# Patient Record
Sex: Female | Born: 1935 | Race: Black or African American | Hispanic: No | Smoking: Former smoker
Health system: Southern US, Community
[De-identification: ages and names within clinical notes are randomized; demographics above are authoritative.]

## PROBLEM LIST (undated history)

## (undated) DIAGNOSIS — F419 Anxiety disorder, unspecified: Secondary | ICD-10-CM

## (undated) DIAGNOSIS — C569 Malignant neoplasm of unspecified ovary: Secondary | ICD-10-CM

## (undated) DIAGNOSIS — I10 Essential (primary) hypertension: Secondary | ICD-10-CM

## (undated) DIAGNOSIS — K219 Gastro-esophageal reflux disease without esophagitis: Secondary | ICD-10-CM

## (undated) DIAGNOSIS — M069 Rheumatoid arthritis, unspecified: Secondary | ICD-10-CM

## (undated) DIAGNOSIS — E785 Hyperlipidemia, unspecified: Secondary | ICD-10-CM

## (undated) DIAGNOSIS — Z8543 Personal history of malignant neoplasm of ovary: Secondary | ICD-10-CM

## (undated) DIAGNOSIS — I509 Heart failure, unspecified: Secondary | ICD-10-CM

## (undated) DIAGNOSIS — I429 Cardiomyopathy, unspecified: Secondary | ICD-10-CM

## (undated) DIAGNOSIS — I2699 Other pulmonary embolism without acute cor pulmonale: Secondary | ICD-10-CM

## (undated) HISTORY — DX: Cardiomyopathy, unspecified: I42.9

## (undated) HISTORY — DX: Gastro-esophageal reflux disease without esophagitis: K21.9

## (undated) HISTORY — DX: Anxiety disorder, unspecified: F41.9

## (undated) HISTORY — DX: Heart failure, unspecified: I50.9

## (undated) HISTORY — DX: Essential (primary) hypertension: I10

## (undated) HISTORY — PX: TOTAL HIP ARTHROPLASTY: SHX124

## (undated) HISTORY — DX: Personal history of malignant neoplasm of ovary: Z85.43

---

## 1998-05-23 ENCOUNTER — Encounter: Admission: RE | Admit: 1998-05-23 | Discharge: 1998-05-24 | Payer: Self-pay | Admitting: Internal Medicine

## 1998-09-03 ENCOUNTER — Encounter: Admission: RE | Admit: 1998-09-03 | Discharge: 1998-09-06 | Payer: Self-pay | Admitting: Orthopedic Surgery

## 1998-09-26 ENCOUNTER — Ambulatory Visit (HOSPITAL_COMMUNITY): Admission: RE | Admit: 1998-09-26 | Discharge: 1998-09-26 | Payer: Self-pay | Admitting: Gynecology

## 1999-08-14 ENCOUNTER — Encounter: Admission: RE | Admit: 1999-08-14 | Discharge: 1999-11-12 | Payer: Self-pay | Admitting: Internal Medicine

## 1999-11-13 ENCOUNTER — Encounter: Admission: RE | Admit: 1999-11-13 | Discharge: 2000-02-11 | Payer: Self-pay

## 2000-03-16 ENCOUNTER — Ambulatory Visit (HOSPITAL_COMMUNITY): Admission: RE | Admit: 2000-03-16 | Discharge: 2000-03-16 | Payer: Self-pay | Admitting: Obstetrics and Gynecology

## 2000-03-16 ENCOUNTER — Encounter: Payer: Self-pay | Admitting: Obstetrics and Gynecology

## 2001-02-15 ENCOUNTER — Ambulatory Visit (HOSPITAL_COMMUNITY): Admission: RE | Admit: 2001-02-15 | Discharge: 2001-02-15 | Payer: Self-pay | Admitting: Internal Medicine

## 2001-02-15 ENCOUNTER — Inpatient Hospital Stay (HOSPITAL_COMMUNITY): Admission: AD | Admit: 2001-02-15 | Discharge: 2001-02-24 | Payer: Self-pay | Admitting: Internal Medicine

## 2001-02-15 ENCOUNTER — Encounter: Payer: Self-pay | Admitting: Internal Medicine

## 2001-02-18 ENCOUNTER — Encounter: Payer: Self-pay | Admitting: Internal Medicine

## 2001-02-19 ENCOUNTER — Encounter: Payer: Self-pay | Admitting: Internal Medicine

## 2001-02-23 ENCOUNTER — Encounter: Payer: Self-pay | Admitting: Internal Medicine

## 2001-04-19 ENCOUNTER — Ambulatory Visit (HOSPITAL_COMMUNITY): Admission: RE | Admit: 2001-04-19 | Discharge: 2001-04-19 | Payer: Self-pay | Admitting: Internal Medicine

## 2001-04-19 ENCOUNTER — Encounter: Payer: Self-pay | Admitting: Internal Medicine

## 2002-06-27 ENCOUNTER — Encounter (HOSPITAL_BASED_OUTPATIENT_CLINIC_OR_DEPARTMENT_OTHER): Admission: RE | Admit: 2002-06-27 | Discharge: 2002-09-25 | Payer: Self-pay | Admitting: Internal Medicine

## 2003-06-19 ENCOUNTER — Encounter (HOSPITAL_BASED_OUTPATIENT_CLINIC_OR_DEPARTMENT_OTHER): Admission: RE | Admit: 2003-06-19 | Discharge: 2003-09-17 | Payer: Self-pay | Admitting: Internal Medicine

## 2004-04-10 ENCOUNTER — Encounter (HOSPITAL_BASED_OUTPATIENT_CLINIC_OR_DEPARTMENT_OTHER): Admission: RE | Admit: 2004-04-10 | Discharge: 2004-07-02 | Payer: Self-pay | Admitting: Internal Medicine

## 2004-04-17 ENCOUNTER — Encounter: Admission: RE | Admit: 2004-04-17 | Discharge: 2004-04-17 | Payer: Self-pay | Admitting: Internal Medicine

## 2005-04-10 ENCOUNTER — Encounter: Admission: RE | Admit: 2005-04-10 | Discharge: 2005-04-10 | Payer: Self-pay | Admitting: Internal Medicine

## 2008-03-06 ENCOUNTER — Emergency Department (HOSPITAL_COMMUNITY): Admission: EM | Admit: 2008-03-06 | Discharge: 2008-03-06 | Payer: Self-pay | Admitting: Emergency Medicine

## 2008-03-16 ENCOUNTER — Encounter (HOSPITAL_BASED_OUTPATIENT_CLINIC_OR_DEPARTMENT_OTHER): Admission: RE | Admit: 2008-03-16 | Discharge: 2008-04-06 | Payer: Self-pay | Admitting: Internal Medicine

## 2008-08-02 ENCOUNTER — Encounter (HOSPITAL_BASED_OUTPATIENT_CLINIC_OR_DEPARTMENT_OTHER): Admission: RE | Admit: 2008-08-02 | Discharge: 2008-09-08 | Payer: Self-pay | Admitting: Internal Medicine

## 2008-10-30 ENCOUNTER — Inpatient Hospital Stay (HOSPITAL_COMMUNITY): Admission: EM | Admit: 2008-10-30 | Discharge: 2008-11-10 | Payer: Self-pay | Admitting: Emergency Medicine

## 2008-10-30 ENCOUNTER — Ambulatory Visit: Payer: Self-pay | Admitting: Pulmonary Disease

## 2008-10-31 ENCOUNTER — Encounter (INDEPENDENT_AMBULATORY_CARE_PROVIDER_SITE_OTHER): Payer: Self-pay | Admitting: Emergency Medicine

## 2008-11-07 ENCOUNTER — Encounter (INDEPENDENT_AMBULATORY_CARE_PROVIDER_SITE_OTHER): Payer: Self-pay | Admitting: Internal Medicine

## 2008-11-07 ENCOUNTER — Ambulatory Visit: Payer: Self-pay | Admitting: Surgery

## 2009-01-29 ENCOUNTER — Emergency Department (HOSPITAL_COMMUNITY): Admission: EM | Admit: 2009-01-29 | Discharge: 2009-01-29 | Payer: Self-pay | Admitting: Emergency Medicine

## 2009-04-07 DIAGNOSIS — I2699 Other pulmonary embolism without acute cor pulmonale: Secondary | ICD-10-CM

## 2009-04-07 HISTORY — DX: Other pulmonary embolism without acute cor pulmonale: I26.99

## 2009-06-05 ENCOUNTER — Encounter (HOSPITAL_BASED_OUTPATIENT_CLINIC_OR_DEPARTMENT_OTHER): Admission: RE | Admit: 2009-06-05 | Discharge: 2009-09-03 | Payer: Self-pay | Admitting: Internal Medicine

## 2009-11-06 ENCOUNTER — Emergency Department (HOSPITAL_COMMUNITY): Admission: EM | Admit: 2009-11-06 | Discharge: 2009-11-07 | Payer: Self-pay | Admitting: Emergency Medicine

## 2009-12-18 ENCOUNTER — Inpatient Hospital Stay (HOSPITAL_COMMUNITY): Admission: EM | Admit: 2009-12-18 | Discharge: 2009-12-22 | Payer: Self-pay | Admitting: Emergency Medicine

## 2009-12-18 ENCOUNTER — Ambulatory Visit: Payer: Self-pay | Admitting: Internal Medicine

## 2010-04-02 ENCOUNTER — Encounter (HOSPITAL_BASED_OUTPATIENT_CLINIC_OR_DEPARTMENT_OTHER)
Admission: RE | Admit: 2010-04-02 | Discharge: 2010-05-07 | Payer: Self-pay | Source: Home / Self Care | Attending: Internal Medicine | Admitting: Internal Medicine

## 2010-06-17 LAB — GLUCOSE, CAPILLARY: Glucose-Capillary: 123 mg/dL — ABNORMAL HIGH (ref 70–99)

## 2010-06-18 ENCOUNTER — Encounter (HOSPITAL_BASED_OUTPATIENT_CLINIC_OR_DEPARTMENT_OTHER): Payer: PRIVATE HEALTH INSURANCE | Attending: Plastic Surgery

## 2010-06-18 DIAGNOSIS — L89109 Pressure ulcer of unspecified part of back, unspecified stage: Secondary | ICD-10-CM | POA: Insufficient documentation

## 2010-06-18 DIAGNOSIS — L8990 Pressure ulcer of unspecified site, unspecified stage: Secondary | ICD-10-CM | POA: Insufficient documentation

## 2010-06-20 LAB — POCT I-STAT, CHEM 8
BUN: 23 mg/dL (ref 6–23)
Hemoglobin: 9.5 g/dL — ABNORMAL LOW (ref 12.0–15.0)
Sodium: 142 mEq/L (ref 135–145)
TCO2: 23 mmol/L (ref 0–100)

## 2010-06-20 LAB — BASIC METABOLIC PANEL
BUN: 8 mg/dL (ref 6–23)
BUN: 9 mg/dL (ref 6–23)
CO2: 26 mEq/L (ref 19–32)
CO2: 26 mEq/L (ref 19–32)
Calcium: 7.9 mg/dL — ABNORMAL LOW (ref 8.4–10.5)
Calcium: 8 mg/dL — ABNORMAL LOW (ref 8.4–10.5)
Calcium: 7.9 mg/dL — ABNORMAL LOW (ref 8.4–10.5)
Chloride: 115 mEq/L — ABNORMAL HIGH (ref 96–112)
GFR calc Af Amer: >60 mL/min (ref >60)
GFR calc non Af Amer: >60 mL/min (ref >60)
GFR calc non Af Amer: 52 mL/min — ABNORMAL LOW (ref >60)
Glucose, Bld: 146 mg/dL — ABNORMAL HIGH (ref 70–99)
Glucose, Bld: 91 mg/dL (ref 70–99)
Potassium: 3.6 mEq/L (ref 3.5–5.1)
Sodium: 139 mEq/L (ref 135–145)
Sodium: 144 mEq/L (ref 135–145)
Sodium: 143 mEq/L (ref 135–145)

## 2010-06-20 LAB — HEMOGLOBIN AND HEMATOCRIT, BLOOD
HCT: 27.4 % — ABNORMAL LOW (ref 36.0–46.0)
HCT: 28.8 % — ABNORMAL LOW (ref 36.0–46.0)
HCT: 29.2 % — ABNORMAL LOW (ref 36.0–46.0)
Hemoglobin: 8.7 g/dL — ABNORMAL LOW (ref 12.0–15.0)
Hemoglobin: 8.8 g/dL — ABNORMAL LOW (ref 12.0–15.0)
Hemoglobin: 9.2 g/dL — ABNORMAL LOW (ref 12.0–15.0)

## 2010-06-20 LAB — CROSSMATCH: Antibody Screen: NEGATIVE

## 2010-06-20 LAB — GLUCOSE, CAPILLARY
Glucose-Capillary: 128 mg/dL — ABNORMAL HIGH (ref 70–99)
Glucose-Capillary: 138 mg/dL — ABNORMAL HIGH (ref 70–99)
Glucose-Capillary: 147 mg/dL — ABNORMAL HIGH (ref 70–99)
Glucose-Capillary: 97 mg/dL (ref 70–99)
Glucose-Capillary: 111 mg/dL — ABNORMAL HIGH (ref 70–99)
Glucose-Capillary: 134 mg/dL — ABNORMAL HIGH (ref 70–99)
Glucose-Capillary: 223 mg/dL — ABNORMAL HIGH (ref 70–99)
Glucose-Capillary: 86 mg/dL (ref 70–99)
Glucose-Capillary: 91 mg/dL (ref 70–99)
Glucose-Capillary: 95 mg/dL (ref 70–99)
Glucose-Capillary: 99 mg/dL (ref 70–99)

## 2010-06-20 LAB — PREPARE FRESH FROZEN PLASMA

## 2010-06-20 LAB — CBC
HCT: 27.7 % — ABNORMAL LOW (ref 36.0–46.0)
HCT: 28.4 % — ABNORMAL LOW (ref 36.0–46.0)
HCT: 27.8 % — ABNORMAL LOW (ref 36.0–46.0)
Hemoglobin: 9 g/dL — ABNORMAL LOW (ref 12.0–15.0)
Hemoglobin: 8.2 g/dL — ABNORMAL LOW (ref 12.0–15.0)
Hemoglobin: 8.8 g/dL — ABNORMAL LOW (ref 12.0–15.0)
MCH: 25.8 pg — ABNORMAL LOW (ref 26.0–34.0)
MCH: 26.5 pg (ref 26.0–34.0)
MCHC: 31.7 g/dL (ref 30.0–36.0)
MCHC: 31.7 g/dL (ref 30.0–36.0)
Platelets: 264 10*3/uL (ref 150–400)
Platelets: 269 10*3/uL (ref 150–400)
RBC: 2.61 MIL/uL — ABNORMAL LOW (ref 3.87–5.11)
RBC: 3.13 MIL/uL — ABNORMAL LOW (ref 3.87–5.11)
RBC: 3.37 MIL/uL — ABNORMAL LOW (ref 3.87–5.11)
RDW: 15.2 % (ref 11.5–15.5)
WBC: 5.6 10*3/uL (ref 4.0–10.5)
WBC: 5.6 10*3/uL (ref 4.0–10.5)

## 2010-06-20 LAB — URINALYSIS, ROUTINE W REFLEX MICROSCOPIC
Bilirubin Urine: NEGATIVE
Bilirubin Urine: NEGATIVE
Glucose, UA: NEGATIVE mg/dL
Ketones, ur: NEGATIVE mg/dL
Specific Gravity, Urine: 1.01 (ref 1.005–1.030)
Urobilinogen, UA: 0.2 mg/dL (ref 0.0–1.0)
pH: 6 (ref 5.0–8.0)

## 2010-06-20 LAB — URINE MICROSCOPIC-ADD ON

## 2010-06-20 LAB — POCT CARDIAC MARKERS: Myoglobin, poc: 219 ng/mL (ref 12–200)

## 2010-06-20 LAB — COMPREHENSIVE METABOLIC PANEL
ALT: 10 U/L (ref 0–35)
ALT: <8 U/L (ref 0–35)
AST: 15 U/L (ref 0–37)
AST: 14 U/L (ref 0–37)
Albumin: 2.1 g/dL — ABNORMAL LOW (ref 3.5–5.2)
Alkaline Phosphatase: 58 U/L (ref 39–117)
CO2: 24 mEq/L (ref 19–32)
Chloride: 114 mEq/L — ABNORMAL HIGH (ref 96–112)
Chloride: 110 mEq/L (ref 96–112)
Creatinine, Ser: 1.33 mg/dL — ABNORMAL HIGH (ref 0.4–1.2)
GFR calc Af Amer: >60 mL/min (ref >60)
GFR calc Af Amer: 46 mL/min — ABNORMAL LOW (ref >60)
GFR calc non Af Amer: 55 mL/min — ABNORMAL LOW (ref >60)
Potassium: 3.7 mEq/L (ref 3.5–5.1)
Sodium: 142 mEq/L (ref 135–145)
Sodium: 140 mEq/L (ref 135–145)
Total Bilirubin: 0.2 mg/dL — ABNORMAL LOW (ref 0.3–1.2)

## 2010-06-20 LAB — URINE CULTURE: Colony Count: 30000

## 2010-06-20 LAB — DIFFERENTIAL
Lymphocytes Relative: 10 % — ABNORMAL LOW (ref 12–46)
Lymphs Abs: 0.7 10*3/uL (ref 0.7–4.0)
Monocytes Absolute: 0.2 10*3/uL (ref 0.1–1.0)
Monocytes Relative: 3 % (ref 3–12)
Neutro Abs: 5.7 10*3/uL (ref 1.7–7.7)
Neutrophils Relative %: 80 % — ABNORMAL HIGH (ref 43–77)

## 2010-06-20 LAB — PHOSPHORUS: Phosphorus: 2.5 mg/dL (ref 2.3–4.6)

## 2010-06-20 LAB — CULTURE, BLOOD (ROUTINE X 2)
Culture  Setup Time: 201109140502
Culture: NO GROWTH

## 2010-06-20 LAB — APTT
aPTT: 31 seconds (ref 24–37)
aPTT: 43 seconds — ABNORMAL HIGH (ref 24–37)

## 2010-06-20 LAB — MAGNESIUM: Magnesium: 1.6 mg/dL (ref 1.5–2.5)

## 2010-06-20 LAB — PROTIME-INR
INR: 1.47 (ref 0.00–1.49)
INR: 10.37 (ref 0.00–1.49)
Prothrombin Time: 20.1 seconds — ABNORMAL HIGH (ref 11.6–15.2)

## 2010-06-20 LAB — MRSA PCR SCREENING: MRSA by PCR: NEGATIVE

## 2010-06-20 LAB — ABO/RH: ABO/RH(D): A POS

## 2010-06-20 LAB — BRAIN NATRIURETIC PEPTIDE: Pro B Natriuretic peptide (BNP): 266 pg/mL — ABNORMAL HIGH (ref 0.0–100.0)

## 2010-06-21 LAB — CBC
HCT: 35.2 % — ABNORMAL LOW (ref 36.0–46.0)
MCH: 27.4 pg (ref 26.0–34.0)
MCV: 85.2 fL (ref 78.0–100.0)
Platelets: 226 10*3/uL (ref 150–400)
RDW: 14.5 % (ref 11.5–15.5)
WBC: 5.9 10*3/uL (ref 4.0–10.5)

## 2010-06-21 LAB — DIFFERENTIAL
Basophils Absolute: 0 10*3/uL (ref 0.0–0.1)
Eosinophils Absolute: 0.2 10*3/uL (ref 0.0–0.7)
Eosinophils Relative: 4 % (ref 0–5)
Lymphocytes Relative: 26 % (ref 12–46)
Lymphs Abs: 1.5 10*3/uL (ref 0.7–4.0)
Monocytes Absolute: 0.3 10*3/uL (ref 0.1–1.0)

## 2010-06-21 LAB — URINALYSIS, ROUTINE W REFLEX MICROSCOPIC
Nitrite: NEGATIVE
Specific Gravity, Urine: 1.011 (ref 1.005–1.030)
Urobilinogen, UA: 0.2 mg/dL (ref 0.0–1.0)

## 2010-06-21 LAB — GC/CHLAMYDIA PROBE AMP, GENITAL
Chlamydia, DNA Probe: NEGATIVE
GC Probe Amp, Genital: NEGATIVE

## 2010-06-21 LAB — URINE CULTURE
Colony Count: NO GROWTH
Culture: NO GROWTH

## 2010-06-21 LAB — BASIC METABOLIC PANEL
BUN: 14 mg/dL (ref 6–23)
CO2: 27 mEq/L (ref 19–32)
Calcium: 9.1 mg/dL (ref 8.4–10.5)
Chloride: 107 mEq/L (ref 96–112)
Creatinine, Ser: 0.94 mg/dL (ref 0.4–1.2)

## 2010-06-21 LAB — URINE MICROSCOPIC-ADD ON

## 2010-06-21 LAB — WET PREP, GENITAL: Yeast Wet Prep HPF POC: NONE SEEN

## 2010-06-25 ENCOUNTER — Other Ambulatory Visit (HOSPITAL_COMMUNITY): Payer: Self-pay | Admitting: Plastic Surgery

## 2010-06-25 ENCOUNTER — Ambulatory Visit (HOSPITAL_COMMUNITY)
Admission: RE | Admit: 2010-06-25 | Discharge: 2010-06-25 | Disposition: A | Discharge status: Home / Self Care | Payer: Medicare Other | Source: Ambulatory Visit | Attending: Plastic Surgery | Admitting: Plastic Surgery

## 2010-06-25 DIAGNOSIS — Z96649 Presence of unspecified artificial hip joint: Secondary | ICD-10-CM | POA: Insufficient documentation

## 2010-06-25 DIAGNOSIS — M899 Disorder of bone, unspecified: Secondary | ICD-10-CM | POA: Insufficient documentation

## 2010-06-25 DIAGNOSIS — M949 Disorder of cartilage, unspecified: Secondary | ICD-10-CM | POA: Insufficient documentation

## 2010-06-25 DIAGNOSIS — M869 Osteomyelitis, unspecified: Secondary | ICD-10-CM

## 2010-06-25 DIAGNOSIS — L989 Disorder of the skin and subcutaneous tissue, unspecified: Secondary | ICD-10-CM | POA: Insufficient documentation

## 2010-07-13 LAB — BASIC METABOLIC PANEL
BUN: 5 mg/dL — ABNORMAL LOW (ref 6–23)
CO2: 25 mEq/L (ref 19–32)
Calcium: 8.3 mg/dL — ABNORMAL LOW (ref 8.4–10.5)
Chloride: 108 mEq/L (ref 96–112)
Creatinine, Ser: 0.53 mg/dL (ref 0.4–1.2)
GFR calc Af Amer: >60 mL/min (ref >60)
GFR calc non Af Amer: >60 mL/min (ref >60)
Glucose, Bld: 101 mg/dL — ABNORMAL HIGH (ref 70–99)
Potassium: 3.9 mEq/L (ref 3.5–5.1)

## 2010-07-13 LAB — CBC
HCT: 28.9 % — ABNORMAL LOW (ref 36.0–46.0)
HCT: 29.7 % — ABNORMAL LOW (ref 36.0–46.0)
Hemoglobin: 10 g/dL — ABNORMAL LOW (ref 12.0–15.0)
Hemoglobin: 9.2 g/dL — ABNORMAL LOW (ref 12.0–15.0)
MCHC: 33.1 g/dL (ref 30.0–36.0)
MCHC: 33.8 g/dL (ref 30.0–36.0)
MCV: 88.4 fL (ref 78.0–100.0)
MCV: 88.4 fL (ref 78.0–100.0)
Platelets: 145 10*3/uL — ABNORMAL LOW (ref 150–400)
Platelets: 181 10*3/uL (ref 150–400)
Platelets: 186 10*3/uL (ref 150–400)
RBC: 3.1 MIL/uL — ABNORMAL LOW (ref 3.87–5.11)
RBC: 3.13 MIL/uL — ABNORMAL LOW (ref 3.87–5.11)
RBC: 3.49 MIL/uL — ABNORMAL LOW (ref 3.87–5.11)
RDW: 15.3 % (ref 11.5–15.5)
RDW: 15.4 % (ref 11.5–15.5)
WBC: 3.7 10*3/uL — ABNORMAL LOW (ref 4.0–10.5)
WBC: 3.8 10*3/uL — ABNORMAL LOW (ref 4.0–10.5)
WBC: 4.1 10*3/uL (ref 4.0–10.5)
WBC: 4.2 10*3/uL (ref 4.0–10.5)

## 2010-07-13 LAB — GLUCOSE, CAPILLARY
Glucose-Capillary: 104 mg/dL — ABNORMAL HIGH (ref 70–99)
Glucose-Capillary: 111 mg/dL — ABNORMAL HIGH (ref 70–99)
Glucose-Capillary: 111 mg/dL — ABNORMAL HIGH (ref 70–99)
Glucose-Capillary: 115 mg/dL — ABNORMAL HIGH (ref 70–99)
Glucose-Capillary: 117 mg/dL — ABNORMAL HIGH (ref 70–99)
Glucose-Capillary: 118 mg/dL — ABNORMAL HIGH (ref 70–99)
Glucose-Capillary: 123 mg/dL — ABNORMAL HIGH (ref 70–99)
Glucose-Capillary: 141 mg/dL — ABNORMAL HIGH (ref 70–99)
Glucose-Capillary: 146 mg/dL — ABNORMAL HIGH (ref 70–99)
Glucose-Capillary: 148 mg/dL — ABNORMAL HIGH (ref 70–99)
Glucose-Capillary: 79 mg/dL (ref 70–99)
Glucose-Capillary: 90 mg/dL (ref 70–99)

## 2010-07-13 LAB — PROTIME-INR
INR: 2.2 — ABNORMAL HIGH (ref 0.00–1.49)
INR: 2.5 — ABNORMAL HIGH (ref 0.00–1.49)
INR: 2.5 — ABNORMAL HIGH (ref 0.00–1.49)
INR: 2.7 — ABNORMAL HIGH (ref 0.00–1.49)
Prothrombin Time: 27.1 seconds — ABNORMAL HIGH (ref 11.6–15.2)
Prothrombin Time: 27.7 seconds — ABNORMAL HIGH (ref 11.6–15.2)

## 2010-07-13 LAB — HEPARIN LEVEL (UNFRACTIONATED): Heparin Unfractionated: <0.1 IU/mL — ABNORMAL LOW (ref 0.30–0.70)

## 2010-07-13 LAB — FACTOR 5 LEIDEN

## 2010-07-13 LAB — HOMOCYSTEINE: Homocysteine: 10.2 umol/L (ref 4.0–15.4)

## 2010-07-14 LAB — GLUCOSE, CAPILLARY
Glucose-Capillary: 110 mg/dL — ABNORMAL HIGH (ref 70–99)
Glucose-Capillary: 113 mg/dL — ABNORMAL HIGH (ref 70–99)
Glucose-Capillary: 123 mg/dL — ABNORMAL HIGH (ref 70–99)
Glucose-Capillary: 125 mg/dL — ABNORMAL HIGH (ref 70–99)
Glucose-Capillary: 131 mg/dL — ABNORMAL HIGH (ref 70–99)
Glucose-Capillary: 133 mg/dL — ABNORMAL HIGH (ref 70–99)
Glucose-Capillary: 148 mg/dL — ABNORMAL HIGH (ref 70–99)

## 2010-07-14 LAB — POCT I-STAT, CHEM 8
BUN: 16 mg/dL (ref 6–23)
Creatinine, Ser: 0.7 mg/dL (ref 0.4–1.2)
Glucose, Bld: 338 mg/dL — ABNORMAL HIGH (ref 70–99)
HCT: 44 % (ref 36.0–46.0)
Hemoglobin: 15 g/dL (ref 12.0–15.0)
Potassium: 4 mEq/L (ref 3.5–5.1)
Potassium: 4.3 mEq/L (ref 3.5–5.1)
Sodium: 138 mEq/L (ref 135–145)
Sodium: 140 mEq/L (ref 135–145)
TCO2: 19 mmol/L (ref 0–100)

## 2010-07-14 LAB — PROTIME-INR
INR: 1.3 (ref 0.00–1.49)
INR: 1.4 (ref 0.00–1.49)
INR: 1.7 — ABNORMAL HIGH (ref 0.00–1.49)
INR: 2.3 — ABNORMAL HIGH (ref 0.00–1.49)
Prothrombin Time: 20.9 seconds — ABNORMAL HIGH (ref 11.6–15.2)
Prothrombin Time: 26.5 seconds — ABNORMAL HIGH (ref 11.6–15.2)

## 2010-07-14 LAB — CBC
HCT: 26.2 % — ABNORMAL LOW (ref 36.0–46.0)
HCT: 28.3 % — ABNORMAL LOW (ref 36.0–46.0)
HCT: 28.5 % — ABNORMAL LOW (ref 36.0–46.0)
HCT: 40 % (ref 36.0–46.0)
Hemoglobin: 12 g/dL (ref 12.0–15.0)
Hemoglobin: 8.7 g/dL — ABNORMAL LOW (ref 12.0–15.0)
Hemoglobin: 9.4 g/dL — ABNORMAL LOW (ref 12.0–15.0)
Hemoglobin: 9.5 g/dL — ABNORMAL LOW (ref 12.0–15.0)
MCHC: 32.5 g/dL (ref 30.0–36.0)
MCHC: 33.2 g/dL (ref 30.0–36.0)
MCV: 87.7 fL (ref 78.0–100.0)
MCV: 87.8 fL (ref 78.0–100.0)
MCV: 88 fL (ref 78.0–100.0)
MCV: 88.2 fL (ref 78.0–100.0)
MCV: 89 fL (ref 78.0–100.0)
Platelets: 106 10*3/uL — ABNORMAL LOW (ref 150–400)
Platelets: 120 10*3/uL — ABNORMAL LOW (ref 150–400)
Platelets: 124 10*3/uL — ABNORMAL LOW (ref 150–400)
RBC: 2.99 MIL/uL — ABNORMAL LOW (ref 3.87–5.11)
RBC: 2.99 MIL/uL — ABNORMAL LOW (ref 3.87–5.11)
RBC: 3.24 MIL/uL — ABNORMAL LOW (ref 3.87–5.11)
RBC: 3.25 MIL/uL — ABNORMAL LOW (ref 3.87–5.11)
RBC: 4.19 MIL/uL (ref 3.87–5.11)
RDW: 14.3 % (ref 11.5–15.5)
RDW: 15 % (ref 11.5–15.5)
RDW: 15.1 % (ref 11.5–15.5)
RDW: 15.1 % (ref 11.5–15.5)
WBC: 3.6 10*3/uL — ABNORMAL LOW (ref 4.0–10.5)
WBC: 4 10*3/uL (ref 4.0–10.5)
WBC: 7 10*3/uL (ref 4.0–10.5)

## 2010-07-14 LAB — HEPATIC FUNCTION PANEL
ALT: 77 U/L — ABNORMAL HIGH (ref 0–35)
AST: 110 U/L — ABNORMAL HIGH (ref 0–37)
Albumin: 3 g/dL — ABNORMAL LOW (ref 3.5–5.2)
Alkaline Phosphatase: 60 U/L (ref 39–117)
Total Bilirubin: 0.7 mg/dL (ref 0.3–1.2)

## 2010-07-14 LAB — BASIC METABOLIC PANEL
CO2: 21 mEq/L (ref 19–32)
CO2: 26 mEq/L (ref 19–32)
Calcium: 8.2 mg/dL — ABNORMAL LOW (ref 8.4–10.5)
Chloride: 107 mEq/L (ref 96–112)
GFR calc Af Amer: >60 mL/min (ref >60)
GFR calc Af Amer: >60 mL/min (ref >60)
GFR calc Af Amer: >60 mL/min (ref >60)
GFR calc Af Amer: >60 mL/min (ref >60)
GFR calc non Af Amer: >60 mL/min (ref >60)
GFR calc non Af Amer: >60 mL/min (ref >60)
GFR calc non Af Amer: >60 mL/min (ref >60)
Glucose, Bld: 119 mg/dL — ABNORMAL HIGH (ref 70–99)
Glucose, Bld: 127 mg/dL — ABNORMAL HIGH (ref 70–99)
Glucose, Bld: 148 mg/dL — ABNORMAL HIGH (ref 70–99)
Potassium: 3.4 mEq/L — ABNORMAL LOW (ref 3.5–5.1)
Potassium: 3.4 mEq/L — ABNORMAL LOW (ref 3.5–5.1)
Potassium: 3.8 mEq/L (ref 3.5–5.1)
Sodium: 139 mEq/L (ref 135–145)
Sodium: 140 mEq/L (ref 135–145)
Sodium: 141 mEq/L (ref 135–145)
Sodium: 143 mEq/L (ref 135–145)

## 2010-07-14 LAB — POCT I-STAT 3, ART BLOOD GAS (G3+)
Bicarbonate: 19.8 mEq/L — ABNORMAL LOW (ref 20.0–24.0)
Patient temperature: 98.6
TCO2: 21 mmol/L (ref 0–100)
pCO2 arterial: 37.8 mmHg (ref 35.0–45.0)
pH, Arterial: 7.306 — ABNORMAL LOW (ref 7.350–7.400)
pH, Arterial: 7.339 — ABNORMAL LOW (ref 7.350–7.400)

## 2010-07-14 LAB — PHOSPHORUS: Phosphorus: 4.4 mg/dL (ref 2.3–4.6)

## 2010-07-14 LAB — POCT CARDIAC MARKERS
CKMB, poc: 2.4 ng/mL (ref 1.0–8.0)
Myoglobin, poc: 147 ng/mL (ref 12–200)
Myoglobin, poc: 150 ng/mL (ref 12–200)

## 2010-07-14 LAB — URINE CULTURE: Colony Count: >=100000

## 2010-07-14 LAB — HEPARIN LEVEL (UNFRACTIONATED)
Heparin Unfractionated: 0.3 IU/mL (ref 0.30–0.70)
Heparin Unfractionated: 0.45 IU/mL (ref 0.30–0.70)
Heparin Unfractionated: 1.01 IU/mL — ABNORMAL HIGH (ref 0.30–0.70)

## 2010-07-14 LAB — URINALYSIS, ROUTINE W REFLEX MICROSCOPIC
Glucose, UA: NEGATIVE mg/dL
Ketones, ur: NEGATIVE mg/dL
Nitrite: NEGATIVE
Protein, ur: 30 mg/dL — AB

## 2010-07-14 LAB — CARDIAC PANEL(CRET KIN+CKTOT+MB+TROPI): Troponin I: 0.04 ng/mL (ref 0.00–0.06)

## 2010-07-14 LAB — CULTURE, BLOOD (ROUTINE X 2)

## 2010-07-14 LAB — MAGNESIUM: Magnesium: 1.7 mg/dL (ref 1.5–2.5)

## 2010-07-14 LAB — APTT
aPTT: 156 seconds — ABNORMAL HIGH (ref 24–37)
aPTT: 26 seconds (ref 24–37)

## 2010-07-14 LAB — DIFFERENTIAL
Basophils Absolute: 0 10*3/uL (ref 0.0–0.1)
Basophils Relative: 0 % (ref 0–1)
Eosinophils Absolute: 0.1 10*3/uL (ref 0.0–0.7)
Lymphs Abs: 2.2 10*3/uL (ref 0.7–4.0)
Neutrophils Relative %: 61 % (ref 43–77)

## 2010-07-14 LAB — HEMOGLOBIN AND HEMATOCRIT, BLOOD
HCT: 31.2 % — ABNORMAL LOW (ref 36.0–46.0)
HCT: 37 % (ref 36.0–46.0)
Hemoglobin: 9.6 g/dL — ABNORMAL LOW (ref 12.0–15.0)

## 2010-07-14 LAB — MRSA PCR SCREENING: MRSA by PCR: NEGATIVE

## 2010-07-14 LAB — URINE MICROSCOPIC-ADD ON

## 2010-07-16 ENCOUNTER — Encounter (HOSPITAL_BASED_OUTPATIENT_CLINIC_OR_DEPARTMENT_OTHER): Payer: PRIVATE HEALTH INSURANCE | Attending: Plastic Surgery

## 2010-07-16 DIAGNOSIS — L89109 Pressure ulcer of unspecified part of back, unspecified stage: Secondary | ICD-10-CM | POA: Insufficient documentation

## 2010-07-16 DIAGNOSIS — L8990 Pressure ulcer of unspecified site, unspecified stage: Secondary | ICD-10-CM | POA: Insufficient documentation

## 2010-08-20 NOTE — Assessment & Plan Note (Signed)
[facility]   [name], [name]                  ACCOUNT NO.:  [id number]   MEDICAL RECORD NO.:  [id number]      DATE OF BIRTH:  [birth year]   PHYSICIAN:  Joanne Gavel, M.D.              VISIT DATE:                                   OFFICE VISIT   HISTORY:  This is an 77 year old female who developed a draining wound  on the right buttock.  After the wound was completely drained, there was  an area of inflamed tissue and an open wound, this is now completely  healed.   IMPRESSION:  Complete wound healing.  The area was covered with Allevyn.  No further treatment is felt to be necessary.  She will return p.r.n.      Joanne Gavel, M.D.  Electronically Signed     RA/MEDQ  D:  08/23/2008  T:  08/24/2008  Job:  [id number]

## 2010-08-20 NOTE — Assessment & Plan Note (Signed)
[facility]   [name], [name]                  ACCOUNT NO.:  [id number]   MEDICAL RECORD NO.:  [id number]      DATE OF BIRTH:  10-Jan-1924   PHYSICIAN:  Jonelle Sports. Sevier, M.D.       VISIT DATE:                                   OFFICE VISIT   HISTORY:  This 77 year old black female is well known to this clinic  having been seen from time to time for small pressure lesions and also  for an abscess on the left upper arm most recently.  She was last seen  here in December.   Approximately 2-3 weeks ago, she developed painful draining area on her  right buttock and about 3 cm in the midline crease.  Her granddaughter  who is a nurse had put hot soaks on the area, and her doctor, Dr. Chestine Spore,  was contacted and prescribed doxycycline.  The granddaughter reports  that the drainage has largely stopped, but there is still a considerable  enlargement there and she is concerned that this may need additional  attention.   There has been no significant interim change of history since the  patient's last visit.   Her regular medications now include:  1. Simvastatin 20 mg daily.  2. Aspirin 81 mg daily.  3. Omeprazole 20 mg daily.  4. Glipizide 10 mg daily.  5. Celebrex 100 mg daily.  6. Furosemide 40 mg daily.  7. Travatan 0.004 mg daily.  8. Darvocet-N 100 one tab every 8 hours as needed.   She has no known medicinal allergies.   On examination today, the patient does not appear to be in immediate  distress and she is afebrile with a temperature of 98.2 degrees.  Her  blood pressure is 132/78, pulse 62, respirations 17.  Her blood glucose  at home was 147 mg/dL.   On the right buttock now about 3 cm lateral to the distal anal crease is  an area of induration which is not presently draining which extends  fairly deeply and is estimated to be approximately 5 x 2 cm, is almost  tubular in nature (it bears mention that she has no history of Crohn  disease or of  ulcerative colitis).  This is slightly tender to touch but  not extremely so.  It does not feel fluctuant.   The impression is that this represents an abscess which hopefully has  been adequately treated but we cannot be certain of that.   DISPOSITION:  Particularly, because the granddaughter is concerned that  she feels there is still an active abscess there, we did under cover of  1% topical lidocaine, incised this area, and I then took a forceps and  broke up some synechiae and so forth we obtained nothing but blood and  no evidence of persistent purulence.  As most inflamed tissue does in  particularly with the aggravation of this lidocaine, this bled rather  profusely.   Having done this, I then cultured the wound, and once we obtained  hemostasis through pressure we dressed it simply with a pressure  dressing.   The granddaughter was advised to have the patient continue hot  compresses for 30 minutes preferably t.i.d., but with the  granddaughter's  help to do everything possible to get them twice daily  at least.   She is placed on cephalexin 500 mg t.i.d. for 6 days and then 500 mg  b.i.d. for 9 additional days.   Her followup visit here will be in 3 weeks, sooner on a p.r.n. basis.           ______________________________  Jonelle Sports Cheryll Cockayne, M.D.     RES/MEDQ  D:  08/02/2008  T:  08/03/2008  Job:  [phone] [id number]

## 2010-08-20 NOTE — H&P (Signed)
NAMEDORAL, DIGANGI                  ACCOUNT NO.:  [id number]   MEDICAL RECORD NO.:  [id number]          PATIENT TYPE:  INP   LOCATION:  2112                         FACILITY:  [facility]   PHYSICIAN:  Felipa Evener, MD  DATE OF BIRTH:  [birth year]   DATE OF ADMISSION:  10/30/2008  DATE OF DISCHARGE:                              HISTORY & PHYSICAL   HISTORY OF PRESENT ILLNESS:  The patient is an 77 year old female with  past medical history significant for venous stasis ulcers that  essentially has rendered her to bedridden as well as diabetes and  hypertension, presented to the hospital after 8 hours of feeling unwell,  feeling weak, and short of breath, but no other symptoms.  Upon  presenting to the emergency department, the patient with hypoxia,  saturations in the low 80s, was placed on 100% nonbreather, saturation  recent 97, had required BiPAP for a short period during the night and  was also hypotensive, started on dopamine.  A CT angiogram performed,  showed saddle embolus as well as bilateral diffuse emboli, but pulmonary  embolism mostly in the lower lobes and pulmonary critical care was  called to admit, and upon evaluating the patient, the patient was put on  dopamine 10 mcg/kg/minute and was on BiPAP on a 100% nonrebreather,  saturation 98%.  The patient has mild dementia and is unable to provide  significant history.  History is obtained from the granddaughter who is  at bedside, but since she is not living with the patient, she was unable  to provide further history.  She has reported she had been afebrile with  no chest pains.  No chills, nausea, vomiting, abdominal pain, or chest  pain.  Did report some cough, productive of clear to off-white sputum.   PAST MEDICAL HISTORY:  Significant for hypertension, diabetes,  congestive heart failure unknown EF, GERD, arthritis with hip  replacement, glaucoma, and cataract.   MEDICATIONS:  Her medications from the last discharge  summary, the  patient does not remember.  Santyl ointment to use on her leg wound,  Motrin over the counter, simvastatin, baby aspirin, omeprazole,  glipizide, Celebrex, furosemide, and Travatan.   ALLERGIES:  No known drug allergies.   FAMILY HISTORY:  Noncontributory.   SOCIAL HISTORY:  Unobtainable.   REVIEW OF SYSTEMS:  A 12-point review of systems was attempted and was  negative other than the above.   PHYSICAL EXAMINATION:  GENERAL:  This is an elderly-appearing female,  resting on the exam bed, in moderate respiratory distress on a 100%  nonrebreather.  HEENT:  Normocephalic and atraumatic.  Pupils equal, round, and reactive  to light.  Extraocular movements are intact.  Oronasal mucosa is within  normal limits.  NECK:  No thyromegaly or lymphadenopathy.  No jugular venous reflux  appreciated.  HEART:  Regular rate and rhythm.  Normal S1 and S2.  No murmurs, rubs,  or gallops appreciated.  LUNGS:  With bibasilar rales.  No other significant abnormalities.  ABDOMEN:  Soft, nontender, and nondistended.  Positive bowel sounds.  EXTREMITIES:  With 2+ edema, evidently  venous stasis and an open wound  in the right heel.  NEUROLOGIC:  Grossly intact.   LABORATORY STUDIES:  Reviewed, significant for hemoglobin of 13.2 and  hematocrit is 39.0.  Sodium of 140, potassium 4.0, chloride is 109,  glucose is 280, BUN is 16, and creatinine is 0.7.  Ionized calcium is  1.2.  Blood gas had a pH of 7.34, pCO2 of 37, pO2 544, and a bicarb of  20 with the patient breathing on a 100% nonrebreather.  Cardiac enzyme  with a troponin less than 0.05, CK-MB of 2.0, myoglobin level of 150,  lactic acid level of 6.0.  BNP was 301.  D-dimer was elevated more than  20.  PT was 1.3, INR rather is 1.3, and PTT is 26.  Urine workup was  negative.  White blood cell count of 7.0 and platelets of 120.  Chest x-  ray reviewed by myself was clear except for bibasilar atelectasis.  The  chest CT had reviewed  myself showed saddle embolus as well as diffuse  bilateral PEs with no evidence of air space disease, some cardiomegaly.   ASSESSMENT AND PLAN:  The patient is an 77 year old female with past  medical history significant for venous stasis, presented with bilateral  pulmonary embolisms as well as a saddle embolus.  At this point, the  patient has an evidence of refractory hypoxia and hypotension, which are  indications for TPAs.  I spoke with granddaughter who states that she is  the healthcare power of attorney at length regarding the risks of using  tPA and the risk of not using it as well.  We decided to utilize tPA as  it is medically indicated at this point.  Understand the risk of  intracranial hemorrhage as well as refractory bleeding and high risk of  death.  The granddaughter expressed understanding and we started tPA  infusion 100 mg over 2 hours.  We will hold heparin for the next 24  hours to be restarted tomorrow morning.  We will order a 2-D echo to  assess cardiac function as well as evidence of right ventricular strain.  We will titrate O2 for a saturation of 80-92% BiPAP on a p.r.n. basis,  Tylenol on a p.r.n. basis.  We titrated dopamine for a MAP of 65 mmHg.  We will give the patient her home medications such as eye drops.  We  will hold aspirin for the time being and follow H and H q.6 x4.  The  patient is to be admitted to the 2100 in the medical intensive care unit  with IV fluid, D5 half normal saline 15 mL/hour x1 L.  We will give the  patient clear liquid as she is not at high risk of intubation at this  point.      Felipa Evener, MD  Electronically Signed     WJY/MEDQ  D:  10/30/2008  T:  10/31/2008  Job:  [id number]

## 2010-08-20 NOTE — Consult Note (Signed)
[name], [name]                  ACCOUNT NO.:  [id number]   MEDICAL RECORD NO.:  [id number]          PATIENT TYPE:  REC   LOCATION:  FOOT                         FACILITY:  [facility]   PHYSICIAN:  Jonelle Sports. Sevier, M.D. DATE OF BIRTH:  1923/06/15   DATE OF CONSULTATION:  03/22/2008  DATE OF DISCHARGE:                                 CONSULTATION   This 77 year old white female had been seen here several years ago but  for unrelated causes and that record is no longer readily retrievable.   She is seen now with the courtesy of Dr. Chestine Spore for assistance with  management of a small abscess on the inner aspect of the left upper arm  and also some decubitus change of the right buttock.   Apparently, about 3 weeks ago, she noted the development of a painful  pimple on the inner aspect of the left upper arm.  This initially just  itched but became progressively sore and eventually became a tender  abscess type area.  It is notable that she was unaware of any  preexisting asymptomatic cyst overlying in this area.   Probably this abscess led her eventually to the emergency room on  March 06, 2008 at which time it was I and D'ed  with uncertain as to  whether a culture was made or not but she was at that time placed on two  weeks' worth of doxycycline 100 mg take and I think twice daily.  That  was completed several days ago.  At the same time, they recommended the  use of colloid therapy for her apparent stage II decubitus on the right  buttock.  Since that time she has been cared for by home health which  has changed the dressing on her abscess on a daily basis using a Santyl  ointment and they have periodically changed the hydrocolloid on the  buttock.  The patient apparently has made satisfactory progress in both  regards.   It was also noted that although the patient is totally immobile and  spends most of her time in a wheelchair, she has no pressure relief type  pad for her  wheelchair.  Likewise, when in bed, she is able to lie only  on her back and has no pressure relief type mattress as well.   She is here today for our evaluation and continuing care.   PAST MEDICAL HISTORY:  The patient had a left hip replacement  approximately in 1990.  When she was in her 30s, she had a surgery to  deal with intra-abdominal adhesions which were thought to have resulted  from radiation therapy of uterine cancer that had been several years  before that.  He had cataract surgery during the implant of her left  eye.   She has had no other major hospitalizations as best I can tell.   She has no known medicinal allergies.   Her regular medications include the  1. Santyl ointment used on the wound as indicated above on a daily      basis.  2. Motrin over-the-counter strength  1-2 tablets p.r.n.  3. Simvastatin 20 mg each evening.  4. Baby aspirin one daily.  5. Omeprazole 20 mg daily.  6. Glipizide 10 mg b.i.d.  7. Celebrex 200 mg daily.  8. Furosemide 40 mg daily.  9. Travatan eye drops nightly at bedtime.  10.She is on Darvocet-N 100 every 8 hours as needed for pain.      Indicates, she has not taken lot of that recently.   PERSONAL HISTORY:  The patient lives alone.  She has been in [place]  only 9 years having previously lived in the [place] area but having  come here to be near a daughter and son-in-law once she became disabled.  She does have a home health aide that comes essentially on a daily basis  and [facility]  which has been on a three times weekly basis.   FAMILY HISTORY:  Not reviewed in detail.  Obviously, would not be  particularly contributory to her present illness.   REVIEW OF SYSTEMS:  The patient has been obese most of her adult life  and is really incapacitated both by that problem and by severe  degenerative arthritis.  She is known to have poor circulation in her  lower  extremities but has had no ulcers there or any other problems on  that basis of recent note.  She has hypertension, hyperlipidemia and  type 2 diabetes the latter of which is said to be well controlled with  recent random blood glucose values in the 115-120 range.  She has no  known renal disease.  She is a nonsmoker, nondrinker, does not use any  unprescribed medications other than the Motrin as indicated above.   At the time of her emergency room evaluation on March 06, 3008, she  had a CBC which showed hemoglobin 11.6, white count 6100, platelets  157,000 and a basic metabolic panel that was essentially normal with  glucose at that time 114 mg/dL.  Her BUN was 11, creatinine 0.9.   PHYSICAL EXAMINATION:  Examination today is limited to vital signs and  quick general evaluation and attention to her wound.   VITAL SIGNS:  Her blood pressure is 130/66, her pulse 74 and regular,  her respirations 18 and her temperature 97.8.  GENERAL:  She is obese, immobile, has edematous lower extremities, has  no skin lesions except what will be described and reflects no  cardiopulmonary distress.  She is afebrile and is grossly neurologically  intact except for her general weakness and her sensory problems  consistent with her diabetic neuropathy.   On the inner aspect of the right upper arm is a small healing abscess  which has been recently incised and drained which measures 1.9 x 1.0 x  0.5 cm.  There are two areas of necrotic skin flap overlying a portion  of this wound.  There is no significant odor.   The patient's right buttock wound we are simply unable to evaluate today  because she cannot stand and is unable to transfer even with our  assistance from her chair to our treatment chair or treatment table.  We  were unable to reposition her enough in her own chair and I can see this  wound.  She assures me that it is draining minimally that it is not deep  and is not in her way of  thinking problematic.   IMPRESSION:  Resolving abscess status post incision and drainage left  upper extremity, decubitus right buttock apparently stage II or III.   DISPOSITION:  1. The left arm abscess site is anesthetized with 5% lidocaine gel      following which I sharply debrided the two small necrotic skin      flaps.  I also debrided a small amount of fibrous material from the      wound base.  This did not appear to be extensive enough to be      indicative or suggestive of a cyst wall.  The wound was left really      quite clean and granular.   The wound today was changed from dressing with Santyl to dressing with  Iodosorb gel and this was covered with a dry dressing.   The recommendation will be made to her home health care that they  cleanse this wound every other day (that is on a Monday, Wednesday, and  Friday basis) and redress at each time with an application of Iodosorb  covered by dry dressing.   We have advised home health that we were unable to see the buttock wound  and I have asked them to photograph it for [place] if they will and allow  that photograph to be shared at the time of her next visit.  Meanwhile,  we have asked them to continue treatment with hydrocolloid as they have  been doing.   We have asked the patient and her home health aide who is with her today  to arrange to have her return here by [facility]  weeks hence at which time with their assistance, we will either examine  her on their stretcher or we will place her on our examination table and  get a better look of the buttock wound.   The patient's home health agency ([facility]) will be asked in addition to  attending the wounds as above to assist the patient in obtaining some  pressure relief mattress and a pressure relief pad for her wheelchair.   As indicated above, her followup visit will be here in 3 weeks.           ______________________________  Jonelle Sports  Cheryll Cockayne, M.D.     RES/MEDQ  D:  03/22/2008  T:  03/22/2008  Job:  [id number]   cc:   Margaretmary Bayley, M.D.

## 2010-08-23 NOTE — Consult Note (Signed)
[place]  Patient:    [name], [name] Visit Number: [id number] MRN: [id number]          Service Type: MED Location: [phone] Attending Physician:  Virgia Land Dictated by:   Charlaine Dalton. Sherene Sires, M.D. [facility] Proc. Date: 02/19/01 Admit Date:  02/15/2001   CC:         [name]. [name], M.D.   Consultation Report  REQUESTING PHYSICIAN:  This is a comprehensive pulmonary consultation requested by Dr. Margaretmary Williams.  REASON FOR CONSULTATION:  Hypoxemic respiratory failure.  HISTORY OF PRESENT ILLNESS:  This is a very complicated 77 year old black female remote smoker who has been chronically wheelchair bound following left hip surgery about five years ago.  Approximately four to six weeks ago, she noticed increasing cough, especially lying down at night, with several teaspoons of mucoid sputum, gradually worsening, and associated for the last several weeks with dyspnea with minimal activity to the point where she was short of breath at rest.  She was admitted by Dr. Chestine Williams on November 12, with the diagnosis of pneumonia suggested by chest x-ray, viral pneumonia.  She states she is feeling better since admission; in fact, she has been weaned off of oxygen.  However, her chest x-ray remains with dense infiltrates bilaterally, and, therefore, I was asked to see her.  Since admission, she has been placed on Rocephin.  However, she denies any definite fever, though she has had some mild chills and sweats at home.  She denies any purulent sputum or hemoptysis.  She does have chronic sinus congestion (for which she uses various petroleum-based ointment) but denies any purulent sinus secretions, epistaxis, or sinus pain.  She denies any unusual myalgias, arthralgias, headache, diarrhea, or feeling like she is coming down with a "cold."  She denies any pleuritic or exertional chest pain, orthopnea, PND, or any change in chronic leg swelling.  PAST  MEDICAL HISTORY:  Significant for cancer of the uterus status post radiation therapy apparently approximately 30 years ago.  She is also status post left total hip replacement five years ago, wheelchair bound since that time.  History is also significant for obesity complicated by diabetes and GERD with chronic leg swelling.  ALLERGIES:  None known.  MEDICATIONS AT HOME:  She is on Glucotrol, Demadex, Nexium, spironolactone, Macrobid (for years).  SOCIAL HISTORY:  She denies any pet exposure.  She moved to [place] three years ago from [place].  She denies any unusual occupational exposures.  She quit smoking about 30 years ago.  She denies any significant history of alcohol or substance abuse.  FAMILY HISTORY:  Negative for respiratory diseases or rheumatism.  REVIEW OF SYSTEMS:  Taken in detail.  Essentially negative except as noted above.  PHYSICAL EXAMINATION:  GENERAL:  This is an obese black female lying back with no increased work of breathing at rest.  VITAL SIGNS:  She has stable vital signs.  T-max during this hospitalization is approximately 100, no temperatures above this level.  HEENT:  Oropharynx is clear.  Nasal turbinates appear to reveal nonspecific turbinate edema.  No evidence of postnasal drainage or cobblestoning.  NECK:  Supple without cervical adenopathy.  Trachea midline.  No thyromegaly.  LUNGS:  Lung fields reveal minimal coarse breath sounds bilaterally.   No typical crackles on inspiration or rhonchi on expiration.  Chest wall was otherwise normal to palpation, percussion, and inspection.  HEART:  Regular rate and rhythm without murmur, gallop, or rub present.  ABDOMEN:  Soft and benign with good excursion but massively obese.  No palpable organomegaly or tenderness.  Bowel sounds were present.  EXTREMITIES:  Chronic venous changes bilaterally with trace edema.  NEUROLOGIC:  No focal deficits.  SKIN:  Warm and dry with no  lesions.  LABORATORY DATA:  Chest x-ray from November 14 revealed bilateral unusual infiltrates with patchy versus fuzzy nodular changes.  PO2 was only 50 on room air, and has improved to 59 now.  WBC is not elevated, and there is no significant eosinophilia.  IMPRESSION:  Diffuse generalized bilateral lung disease of relatively incipient onset in this patient who is chronically wheelchair bound and, therefore, cannot really pin down for [place] the specific time her illness began. It is most consistent I believe with either an atypical drug reaction, noting that she has been on Macrobid chronically; or the other possibility would, considering based on her exposure, lipoid pneumonia from chronic aspiration of nasal petroleum products.  However, the differential diagnosis also includes bronchiolitis obliterans organizing pneumonia and other forms of rheumatoid associated interstitial lung disease, metastatic cancer (note that she was treated with radiation, but this was over 30 years ago, for uterine cancer). Granulomatous disease needs to be considered including tuberculosis and fungal, although the pattern of involvement is not typical for either. Hypersensitivity and rhinitis is unlikely as well because she really does not have any risk factors that I can identify.  However, I believe the burden is on [place] to prove that she does not have TB or any form of noninflammatory lung disease, and I, therefore, recommend the following. 1. I agree with her present regimen.  I would avoid Macrodantin as noted for    the above reasons and note that it was not continued after hospitalization. 2. I would check a sed rate as well as an RA, Latex, and ANA to "screen her"    for collagen-vascular diseases as well as a CEA. 3. I would obtain a sinus CT scan as well as a chest CT scan which is already    planned, and treat her sinuses empirically with steroids for nonspecific    rhinitis and eliminate the  use of petroleum products in case this is a     form of lipoid-induced aspiration pneumonia.  If she does not improve over the next several days, a decision will need to be made by November 18 to whether to subject this patient to open lung biopsy versus trying her empirically on steroids.  Because she is diabetic, I appreciate that she has an increased risk of side effects from steroids, but because of her body habitus, she also has an increased risk of postoperative pulmonary complications; and, if she does have inflammatory lung disease, she will need to take steroids anyway.  Since I strongly favor inflammation rather than infection here, I am going to lean on the side of recommending at this point empiric steroids for 48 hours and then reassess the need for open lung biopsy at that point. Dictated by:   Charlaine Dalton. Sherene Sires, M.D. [facility] Attending Physician:  Virgia Land DD:  02/19/01 TD:  02/19/01 Job: [id number]

## 2010-08-23 NOTE — Discharge Summary (Signed)
[place]  Patient:    [name], WERMUTH Visit Number: [id number] MRN: [id number]          Service Type: MED Location: [phone] Attending Physician:  Virgia Land Dictated by:   Lindell Spar. Chestine Spore, M.D. Admit Date:  02/15/2001 Discharge Date: 02/24/2001                             Discharge Summary  DISCHARGE DIAGNOSES: 1. Bilateral pneumonia. 2. 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 3. Type 2 diabetes mellitus. 4. Venous insufficiency with chronic venous dermatitis. 5. Morbid obesity. 6. Degenerative joint disease. 7. Previous history of reflux gastritis esophagitis. 8. Rule out Macrodantin induced pulmonary fibrosis.  REASON FOR ADMISSION:  This is the first [facility] hospitalization for [name], a 77 year old type 2 diabetic hypertensive woman who is brought into the hospital with a four week history of progressive cough, dyspnea on exertion, and shortness of breath.  The patient denied any associated chest pain, no palpitations, hemoptysis, no history of prior pneumonia or pulmonary tuberculosis.  PHYSICAL EXAMINATION:  GENERAL:  She is a markedly obese African-American woman appearing quite anxious and dyspneic.  VITAL SIGNS:  Blood pressure 145/80, pulse rate of 96, respiratory rate of 24 to 28, and temperature is 97.9.  HEENT:  There is no conjunctival pallor, no scleral icterus.  NECK:  Supple, no adenopathy, no jugular venous distension.  CHEST:  She has shallow respirations, no splinting, tenderness.  There is bilateral posterior mid and lower lobar rales and rhonchi.  No wheezes, and there is no consolidation.  CARDIAC:  Her heart sounds are intact.  No visible pulsations, heaves, thrills, murmurs, rubs, or gallops.  ABDOMEN:  She has hyperpigmented radiant radiation induced changes of the lower abdomen, but no focal tenderness, organomegaly, and no  masses.  EXTREMITIES:  Her distal legs are indurated, hyperpigmented, she has varicosities.  She has 2+ pedal and pretibial edema.  There is no calf tenderness, negative Homans sign.  NEUROLOGIC:  She is alert and oriented, cooperative, and there is no focal sensory, motor, or reflex deficit.  LABORATORY DATA:  Chest x-ray:  She has diffuse infiltrates bilaterally both in the upper and lower lung fields.  It has a reticular pattern, and is probably described as suggesting atypical pneumonia, and/or congestive heart failure.  Her arterial blood gas on room air shows a PO2 of 50, PCO2 of 36, pH 7.45.  White count 6100 with a left shift.  Hematocrit is 42.  INR is 1.3. Sodium 133, potassium 3.8, glucose 146.  SGOT 48.  PT 38.  Albumin 2.7. Calcium 8.3.  Her CK-MB is 471, with a relative index of 1.9, troponin-I low is 0.01.  Her TSH is normal at 1.4.  Urinalysis negative.  HOSPITAL COURSE:  The patient is admitted with a working diagnosis of a probable infectious bilateral pneumonia.  After appropriate cultures, she was started empirically on Rocephin 1 g IV daily, given supplemental nasal O2 to maintain her oxygen saturations at greater than 95.  A subsequent 2-D echocardiogram showed normal left ventricular function.  She had mild to moderate calcification of the aortic valve.  Her ejection fraction was 55 to 65%, and a trial of an acute diuresis did not produce any significant change in her pulmonary infiltrates.  After several days of parenteral antibiotic therapy and without any significant change in her clinical  condition, a pulmonary consultation was requested of Dr. Sandrea Hughs.  The patient was seen, and Dr. Sherene Sires felt that her x-ray changes and her clinical history was most consistent with probably an atypical pneumonia related either to her rheumatoid arthritis or her chronic Macrodantin therapy which she had taken at the recommendation of her urologist to prevent chronic  urinary tract infections.  At the recommendation of pulmonary medicine, the patients expectorants were discontinued.  She was given albuterol therapy only on a p.r.n. basis, and it was felt that a course of steroids would not be contraindicated at this point in time.  In addition to the above noted abnormalities, a subsequent rheumatoid factor was found to be elevated at 135, with a sedimentation rate of 58.  A PPD was performed and found to be negative.  On her steroid therapy, the patient subjectively was much improved, although as suspected she did suffer from an exacerbation in her diabetes.  She was initially treated with diet alone, however, with her prednisone she was subsequently discharged home on a dose of 20 mg of Glucotrol XL daily.  She was instructed on the use of a home glucose monitor to measure her sugars as an outpatient.  The patient was seen in consultation by Dr. Kellie Simmering of the rheumatology service who felt that without an appropriate lung biopsy that it could be very difficult to blame her pulmonary problems on rheumatoid arthritis.  It was felt by the physicians involved that we would withhold any biopsy, and we would continue to use a tapering course of steroids and antibiotics to see how much improvement we got, and how much clearing of her infiltrates.  If we found that this was unsuccessful, then we would proceed with biopsy.  CONDITION ON DISCHARGE:  Significantly improved.  DIET:  No added salt, no concentrated sweets.  DISCHARGE MEDICATIONS: 1. Prednisone 40 mg b.i.d. to be tapered over a 3 week period of time. 2. Her Glucotrol was 10 mg b.i.d. 3. The patient was specifically instructed not to use any more Macrobid. 4. Demodex or torasemide 20 mg q.d. 5. Aldactone 25 mg b.i.d. 6. Continue Nexium 40 mg q.d.  FOLLOWUP: 1. The patient is scheduled to be seen in my office in seven days to recheck    her blood sugars. 2. She is to be seen by Dr. Sherene Sires in  two weeks.  PROGNOSIS:  Fair. Dictated by:   Lindell Spar. Chestine Spore, M.D. Attending Physician:  Virgia Land  DD:  03/25/01 TD:  03/26/01 Job: [id number]

## 2010-08-23 NOTE — Consult Note (Signed)
[place]  Patient:    [name], [name] Visit Number: [id number] MRN: [id number]          Service Type: Attending:  Aundra Dubin, M.D. Dictated by:   Aundra Dubin, M.D. Proc. Date: 02/23/01   CC:         Lindell Spar. Chestine Spore, M.D.  Charlaine Dalton. Sherene Sires, M.D. [facility]   Consultation Report  CHIEF COMPLAINT:  Cough, shortness of breath, positive rheumatoid factor.  HISTORY OF PRESENT ILLNESS:  The patient is a 77 year old black female who has obesity, type 2 diabetes and is admitted for work-up of a persistent cough for two months more recently associated with exertional shortness of breath.  She has a rheumatoid factor of 135 (0-20).  She has an ANA of 1-160, homogeneous, and her ESR is 58.  I have reviewed chest x-rays which show bilateral fluffy infiltrates that are more dense in the upper lobes.  She is quite toxic with PO2 in the 50s on room air.  Ms. Rubey says that her cough is scant of mucus.  She does not have a strangled feeling.  The cough is decidedly worse at night.  She says she has a history of heartburn and has used Rolaids quite a bit for years.  She has a history of rheumatoid arthritis since the age of 9, but does not know any of the medicines she has been on, but says there were plenty.  At the present time her knees are her worst joints.  She says her hands and wrists have not hurt her recently over the last several months.  She denies any fever.  There has been no new rashes.  There is a report of weight loss but this is hard to document.  She denies numbness and tingling or loss of strength to the joints. She does not have headaches, jaw claudication, or any acute visual changes. There has been no chest pain with her pulmonary difficulties.  Further laboratories show that she has had a negative AFB smear twice.  She has been treated with antibiotics and there has been little to no change in the infiltrates as of February 23, 2001.  She has been on steroids now for a few days.  She is PPD negative.  The admission WBC was 6.2, hemoglobin 14.3, platelets 213,000.  Her albumin was significantly low at 2.7, total protein 8.2, creatinine 0.7.  LDH 279, AST 48, ALT 38, alkaline phosphatase 65.  CK 471, CK-MB 8.9.  PT 15.4.  Urinalysis showed no protein or glucose. Microscopy was not required per criteria.  Her CEA was negative.  TSH 1.4.  PAST MEDICAL HISTORY/PAST SURGICAL HISTORY:  Obesity, diabetes, uterine cancer, status post radiation therapy 30 years ago, status post left hip replacement and has been wheelchair bound since that time, chronic leg swelling, GERD.  CURRENT MEDICATIONS:  1. Pepcid 20 mg b.i.d.  2. Heparin subcutaneous.  3. Afrin q.12h.  4. Insulin.  5. Prednisone 20 mg b.i.d.  6. Glucotrol 10 mg XL q.d.  7. Restoril h.s.  8. Darvocet p.r.n.  9. Hydrocodone p.r.n. 10. Albuterol inhaler.  SOCIAL HISTORY:  She has lived in [place]. for the past 25 years and has recently moved to [place] three months ago.  She lives alone in senior citizen housing.  A daughter lives nearby.  She has smoked no cigarettes for 28 years.  She denies drinking.  FAMILY HISTORY:  She did not know her fathers health status very  well.  She says her mother had some type of arthritis.  PHYSICAL EXAMINATION  VITAL SIGNS:  Pulse 72, blood pressure 122/56, temperature 97 degrees, respirations 16.  GENERAL:  She is significantly overweight and is in no distress.  She gives a good history.  She coughs a little while I am in the room.  SKIN:  There is significant rough, indurated stasis changes from the bilateral legs to the mid calf distally.  These areas were tender when palpated.  No other rashes were observed.  HEENT:  PERRL/EOMI.  Mouth:  Few teeth left in her mouth.  NECK:  Negative JVD.  LUNGS:  There are slight bronchiolar sounds with slight rales at end expiration throughout.  HEART:   Regular.  No murmur.  ABDOMEN:  Obese, soft, nontender.  MUSCULOSKELETAL:  There is puffiness to the MCPs through the wrists and they are mildly tender.  There seems to be some decreased range of motion to the wrist.  Elbows, shoulders:  Good range of motion.  Back:  Nontender.  Hips: Not examined.  The knees are tender with flexion at about 120 degrees.  The right knee was warm.  The ankles showed 2-3+ pitting edema.  The feet were non-arthritically swollen and nontender.  NEUROLOGIC:  Strength 5/5, although there was easy giving to the legs. Sensation seemed to be reasonably intact.  ASSESSMENT AND PLAN: 1. Chronic cough with bilateral fluffy x-ray infiltrates.  She has also had a    CT scan which the impression discussed "severe chronic obstructive    pulmonary disease."  However, this does not correlate with the x-rays.    There was seen on the CT scan bilateral interstitial and alveolar    opacities.  The differential remained wide.  She also had a CT scan of her    sinuses which was negative.  She has the positive rheumatoid factor and    chronic cough.  I believe tuberculosis is still a possibility.  She has    been on the steroids and I am not entirely clear what is better, although    she says she is better and Dr. Chestine Spore reports that she is feeling some    better.  I am not sure if this includes the cough.  I believe we need more    of a tissue diagnosis, possibly from bronchoscopy or an open lung biopsy.    I believe the differential still is wide.  Because of the urinalysis and    creatinine being normal this is less likely a ______ vasculitis.  Although    there is some report of weight loss, she really did not have an aching,    hurting syndrome to suggest a vasculitic process.  I believe BOOP, possible    reflux problems, cancer are all still in the possible diagnosis. 2. History of rheumatoid arthritis.  I will send her to have hand x-rays     obtained.  She is on a  moderate amount of steroids and this could be    masking what I could have seen a few days ago.  If she has had rheumatoid    arthritis for 25 years then there should be some signs by the hands and    wrists at this point. 3. Obesity. 4. Diabetes. 5. History of uterine cancer.  Time:  80 minutes. Dictated by:   Aundra Dubin, M.D. Attending:  Aundra Dubin, M.D. DD:  02/23/01 TD:  02/24/01 Job: [id number]

## 2010-08-23 NOTE — Discharge Summary (Signed)
NAMEHADLEA, [name]                  ACCOUNT NO.:  [id number]   MEDICAL RECORD NO.:  [id number]          PATIENT TYPE:  INP   LOCATION:  5029                         FACILITY:  [facility]   PHYSICIAN:  Margaretmary Bayley, M.D.    DATE OF BIRTH:  28-May-1923   DATE OF ADMISSION:  10/30/2008  DATE OF DISCHARGE:  11/10/2008                               DISCHARGE SUMMARY   DISCHARGE DIAGNOSES:  1. Acute pulmonary embolization with infarction.  2. Urinary tract infection with Proteus mirabilis.  3. Systemic hypertension.  4. Type 2 diabetes mellitus.  5. Morbid obesity.  6. Gastroesophageal reflux disease.  7. Hypotension.  8. Hypoxemia.  9. Hypokalemia.  10.History of previous replacements bilaterally.   REASON FOR ADMISSION:  [name] is an 77 year old African American  woman, who presented to the emergency room with anterior chest pain,  hypotension, and shortness of breath with a constellation of signs and  symptoms.  The appropriate radiologic studies were obtained and found to  be consistent with a saddle embolus with bilateral pulmonary emboli  being noted.  The patient was subsequently admitted to the Intensive  Care Unit under the care of cardiopulmonary intensivist.   ADMITTING INITIAL LABORATORIES AND X-RAY DATA:  Arterial blood gas on  the 100% O2, her pH was 7.30 with a pCO2 of 38 and pO2 of 296.  White  count of 7000 with a hematocrit of 41 and a hemoglobin of 13.2.  Her  stools were negative for occult blood.  D-dimer was greater than 20.  Her admission comprehensive metabolic panel was unremarkable except for  an elevation in her glucose to 338, BUN is normal at 17, creatinine of  0.65, total protein of 7.2 with an albumin of 3, which is slightly low.  Her AST is elevated at 110 and ALT of 77.  She had normal alkaline  phosphatase, total bilirubin, magnesium, and phosphorus.  Lactic acid  was high at 6.0.  Homocysteine was normal at 10.2 and she had normal CPK-  MB and  troponin levels.  TSH was low at 0.112 and a free T4 was low at  0.68.  Admission urinalysis; a bacterium of many, leukocyte esterase was  large at specific gravity of 1.014.  Blood cultures revealed no growth  x2 and urine was positive.  Urine culture revealed greater than 100,000  colonies of Morganella morganii.  Her sensitivities were positive except  for ampicillin and cefazolin.   The patient was admitted to the Intensive Care Unit where she was  continued on heparin therapy to anticoagulate her systemically.  She was  given supplemental O2, and her blood pressure was supported with  dopamine.  She was likewise started on t-PA with 50 mL being infused and  then subsequently continued at 50 mL/h. over the first 24 hours.  The  patient's heparin-related anticoagulation was monitored closely with  PTTs, and she had no bleeding complications.  Within the first 36 hours  of her hospitalization, the patient was able to have a dopamine tapered  and subsequently discontinued.  Starting the third hospital day, the  patient began Coumadin at a dose of 7.5 mg per day and she was started  on Rocephin for her urinary tract infection.  The patient was  subsequently discharged from the Intensive Care Unit within 4-5 days.  Started on her oral hypoglycemic agents for her type 2 diabetes mellitus  along with potassium replacement therapy.  The remainder of her hospital  stay was unremarkable and uneventful.  Her diet and activity was slowly  advanced without any further complications.  O2 sats on room air were  noted to be greater than 90 and she was subsequently discharged home  without the need of any supplemental O2.  The patient's discharge  condition is significantly improved.  She is scheduled to be followed by  the advanced home health nurse where protimes would be checked twice per  week, and she is scheduled to be seen back in the office in 2 weeks.  A  Hoyer lift along with an extra wide  commode for bedside use was  obtained.   DISCHARGE MEDICATIONS:  1. Simvastatin at 20 mg per day.  2. Glimepiride 4 mg daily.  3. Coumadin 7.5 mg per day.  4. Potassium 20 mEq per day.  5. Lasix 40 mg daily.  6. Senokot-S 1 at bedtime.   She is discharged home on a 3-g sodium, 1500 calories carb-modified fat-  restricted diet with a condition that is significantly improved.      Margaretmary Bayley, M.D.  Electronically Signed     PC/MEDQ  D:  11/30/2008  T:  12/01/2008  Job:  [id number]

## 2010-11-05 IMAGING — CR DG CHEST 1V PORT
1 series · 1 of 1 positions shown · non-contrast
Comparison: Portable exam 9601 hours compared to 12/18/2009

CLINICAL DATA: Anemia, acute bleeding

PORTABLE CHEST - 1 VIEW

[AP]
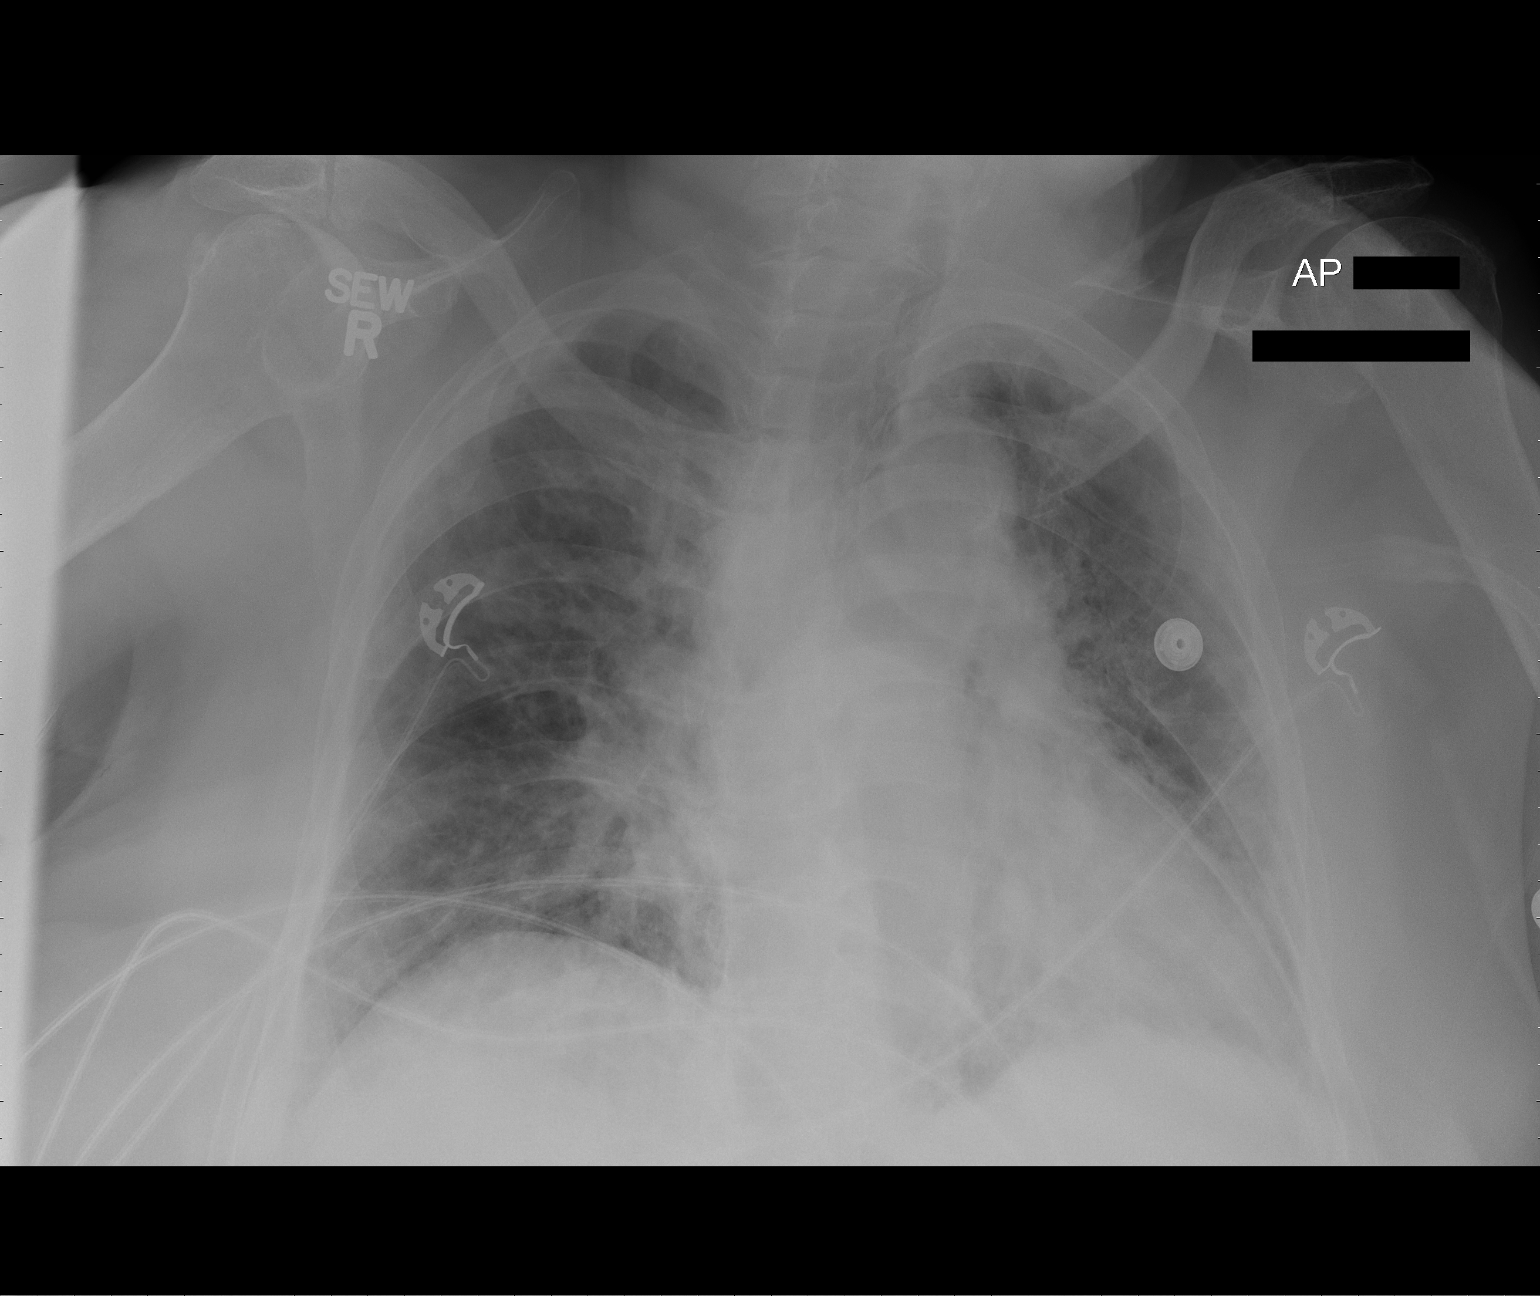

[1 of 1 positions shown; findings below may reference images not displayed]

FINDINGS: Rotated to the left.
Stable heart size and mediastinal contours.
Atherosclerotic calcification aortic arch.
Scattered bilateral pulmonary infiltrates, slightly increased in
right lung since previous exam.
No gross effusion or pneumothorax.
IMPRESSION: Patchy bilateral pulmonary infiltrates, minimally increased in
right lung since previous exam.

## 2011-01-07 LAB — DIFFERENTIAL
Eosinophils Absolute: 0.2
Eosinophils Relative: 3
Lymphocytes Relative: 17
Lymphs Abs: 1
Monocytes Relative: 5
Neutrophils Relative %: 75

## 2011-01-07 LAB — POCT I-STAT, CHEM 8
BUN: 11
Creatinine, Ser: 0.9
Glucose, Bld: 114 — ABNORMAL HIGH
Hemoglobin: 11.9 — ABNORMAL LOW
Potassium: 4.2

## 2011-01-07 LAB — CBC
HCT: 34.9 — ABNORMAL LOW
Hemoglobin: 11.6 — ABNORMAL LOW
MCHC: 33.1
MCV: 87.4
Platelets: 157
RDW: 13.9

## 2011-03-05 ENCOUNTER — Ambulatory Visit (HOSPITAL_COMMUNITY)
Admission: RE | Admit: 2011-03-05 | Discharge: 2011-03-05 | Disposition: A | Discharge status: Home / Self Care | Payer: PRIVATE HEALTH INSURANCE | Source: Ambulatory Visit | Attending: Internal Medicine | Admitting: Internal Medicine

## 2011-03-05 DIAGNOSIS — H3400 Transient retinal artery occlusion, unspecified eye: Secondary | ICD-10-CM

## 2011-03-05 DIAGNOSIS — I517 Cardiomegaly: Secondary | ICD-10-CM

## 2011-03-05 DIAGNOSIS — G453 Amaurosis fugax: Secondary | ICD-10-CM

## 2011-03-05 DIAGNOSIS — I6789 Other cerebrovascular disease: Secondary | ICD-10-CM | POA: Insufficient documentation

## 2011-03-05 NOTE — Progress Notes (Signed)
*  PRELIMINARY RESULTS* Carotid duplex completed. No evidence of extracranial carotid stenosis. Vertebral arteries are patent and flow is antegrade.  [name], [place] D 03/05/2011, 2:15 PM

## 2011-03-05 NOTE — Progress Notes (Signed)
  Echocardiogram 2D Echocardiogram has been performed.  Dewitt Hoes, RDCS 03/05/2011, 2:13 PM

## 2011-05-22 ENCOUNTER — Emergency Department (HOSPITAL_COMMUNITY)
Admission: EM | Admit: 2011-05-22 | Discharge: 2011-05-22 | Disposition: A | Discharge status: Home / Self Care | Payer: PRIVATE HEALTH INSURANCE | Attending: Emergency Medicine | Admitting: Emergency Medicine

## 2011-05-22 ENCOUNTER — Emergency Department (HOSPITAL_COMMUNITY): Payer: PRIVATE HEALTH INSURANCE

## 2011-05-22 DIAGNOSIS — S0990XA Unspecified injury of head, initial encounter: Secondary | ICD-10-CM

## 2011-05-22 DIAGNOSIS — W19XXXA Unspecified fall, initial encounter: Secondary | ICD-10-CM

## 2011-05-22 DIAGNOSIS — Y9210 Unspecified residential institution as the place of occurrence of the external cause: Secondary | ICD-10-CM | POA: Insufficient documentation

## 2011-05-22 DIAGNOSIS — S0181XA Laceration without foreign body of other part of head, initial encounter: Secondary | ICD-10-CM

## 2011-05-22 DIAGNOSIS — S0180XA Unspecified open wound of other part of head, initial encounter: Secondary | ICD-10-CM | POA: Insufficient documentation

## 2011-05-22 DIAGNOSIS — W010XXA Fall on same level from slipping, tripping and stumbling without subsequent striking against object, initial encounter: Secondary | ICD-10-CM | POA: Insufficient documentation

## 2011-05-22 DIAGNOSIS — G319 Degenerative disease of nervous system, unspecified: Secondary | ICD-10-CM | POA: Insufficient documentation

## 2011-05-22 DIAGNOSIS — M47812 Spondylosis without myelopathy or radiculopathy, cervical region: Secondary | ICD-10-CM | POA: Insufficient documentation

## 2011-05-22 NOTE — ED Notes (Signed)
PT arrived via [facility] c/o fall. Fell out bed LAC between eyes on nose. Denies LOC. EMS states pt hit head on night table.

## 2011-05-22 NOTE — ED Notes (Signed)
Facesheet and history of care given to PTAR.

## 2011-05-22 NOTE — ED Provider Notes (Signed)
History     CSN: [id number]  Arrival date & time 05/22/11  7829   First MD Initiated Contact with Patient 05/22/11 0532      Chief Complaint  Patient presents with  . Fall     HPI The patient reports that she lives in assisted living center and she attempted to get out of bed in transfer to wheelchair.  She slipped and struck her 4 head on the bedside table.  She had no loss consciousness.  She complains of a laceration to her 4 head.  She denies headache.  She denies significant neck pain.  She denies upper extremity weakness.  She does have a history of kyphosis.  She was brought in by EMS on long spine board and in a cervical collar.  The patient denies use of anticoagulants.  She has no nausea or vomiting.  She has no chest pain.  She has no shortness of breath.   No past medical history on file.  No past surgical history on file.  No family history on file.  History  Substance Use Topics  . Smoking status: Not on file  . Smokeless tobacco: Not on file  . Alcohol Use: Not on file    OB History    No data available      Review of Systems  All other systems reviewed and are negative.    Allergies  Review of patient's allergies indicates no known allergies.  Home Medications   Current Outpatient Rx  Name Route Sig Dispense Refill  . BUSPIRONE HCL 10 MG PO TABS Oral Take 10-20 mg by mouth 2 (two) times daily after a meal.    . GLIMEPIRIDE 2 MG PO TABS Oral Take 2 mg by mouth daily before breakfast.    . OMEPRAZOLE 20 MG PO CPDR Oral Take 20 mg by mouth daily.    . OXYBUTYNIN CHLORIDE 5 MG PO TABS Oral Take 5 mg by mouth daily.    Marland Kitchen SIMVASTATIN 20 MG PO TABS Oral Take 20 mg by mouth every evening.    Marland Kitchen VITAMIN E 200 UNITS PO CAPS Oral Take 200 Units by mouth daily.    Marland Kitchen ZINC GLUCONATE 50 MG PO TABS Oral Take 50 mg by mouth daily.      BP 135/57  Temp(Src) 98.7 F (37.1 C) (Oral)  Resp 24  Ht 5\' 3"  (1.6 m)  Wt 190 lb (86.183 kg)  BMI 33.66 kg/m2  SpO2  96%  Physical Exam  Nursing note and vitals reviewed. Constitutional: She is oriented to person, place, and time. She appears well-developed and well-nourished. No distress.  HENT:  Head: Normocephalic.       Laceration to middle of the 4 head between her eyebrows.  Laceration is approximately 2 cm long.  There is no active bleeding.  She has no malocclusion.  She has no trismus.  Her dentition is otherwise unremarkable.   Eyes: EOM are normal.  Neck: Neck supple.       Immobilized in a cervical collar.  Mild cervical and paracervical tenderness.  Cardiovascular: Normal rate, regular rhythm and normal heart sounds.   Pulmonary/Chest: Effort normal and breath sounds normal.  Abdominal: Soft. She exhibits no distension. There is no tenderness.  Musculoskeletal: Normal range of motion.       No thoracic or lumbar spinal tenderness  Neurological: She is alert and oriented to person, place, and time.  Skin: Skin is warm and dry.  Psychiatric: She has a normal mood and  affect. Judgment normal.    ED Course  Procedures (including critical care time)  LACERATION REPAIR Performed by: Lyanne Co Consent: Verbal consent obtained. Risks and benefits: risks, benefits and alternatives were discussed Patient identity confirmed: provided demographic data Time out performed prior to procedure Prepped and Draped in normal sterile fashion Wound explored Laceration Location: forhead Laceration Length: 2 cm No Foreign Bodies seen or palpated Anesthesia: local infiltration Local anesthetic: lidocaine 2 % with epinephrine Anesthetic total: 4 ml Irrigation method: syringe Amount of cleaning: standard Skin closure: 5-0 Prolene  Number of sutures or staples: 6  Technique: Running interlocked  Patient tolerance: Patient tolerated the procedure well with no immediate complications.   Labs Reviewed - No data to display Ct Head Wo Contrast  05/22/2011  *RADIOLOGY REPORT*  Clinical Data:  Fall.   Laceration to the nose and between eyes. Bruising to the right frontal area.  Posterior neck pain.  CT HEAD WITHOUT CONTRAST CT CERVICAL SPINE WITHOUT CONTRAST  Technique:  Multidetector CT imaging of the head and cervical spine was performed following the standard protocol without intravenous contrast.  Multiplanar CT image reconstructions of the cervical spine were also generated.  Comparison:   None  CT HEAD  Findings: Diffuse cerebral atrophy.  Low attenuation changes in the deep white matter consistent with small vessel ischemia.  No mass effect or midline shift.  No abnormal extra-axial fluid collections.  Gray-white matter junctions are distinct.  Basal cisterns are not effaced.  No evidence of acute intracranial hemorrhage.  Vascular calcifications.  No depressed skull fractures.  Visualized paranasal sinuses are not opacified.  There is a focal subcutaneous soft tissue swelling over the right nasal bridge with focal subcutaneous gas.  This is incompletely included on the study and suggests a soft tissue hematoma with laceration.  IMPRESSION: Chronic atrophy and small vessel ischemic changes.  No evidence of acute intracranial hemorrhage, mass lesion, or acute infarct.  CT CERVICAL SPINE  Findings: Normal alignment of the cervical vertebrae and facet joints.  Degenerative changes throughout the cervical spine with mild narrowing of cervical interspaces and endplate hypertrophic changes.  Ligamentous calcification posteriorly with disc osteophyte complexes at C3-4, C4-5, C5-6, C6-7, and C7-T1 levels causing mild effacement the anterior thecal sac.  Changes are most prominent at C6-7 and C7-C1. No vertebral compression deformities. No prevertebral soft tissue swelling.  Lateral masses of C1 appear symmetrical.  The odontoid process appears intact.  No focal bone lesion or bone destruction.  Bone cortex and trabecular architecture appear intact.  Mild curvature of the cervical spine convex towards the left,  likely positional.  Muscle spasm can also have this appearance.  No paraspinal soft tissue infiltration. Calcifications in the cervical carotid arteries.  IMPRESSION: Degenerative changes of the cervical spine.  No displaced fractures identified.  Original Report Authenticated By: Marlon Pel, M.D.   Ct Cervical Spine Wo Contrast  05/22/2011  *RADIOLOGY REPORT*  Clinical Data:  Fall.  Laceration to the nose and between eyes. Bruising to the right frontal area.  Posterior neck pain.  CT HEAD WITHOUT CONTRAST CT CERVICAL SPINE WITHOUT CONTRAST  Technique:  Multidetector CT imaging of the head and cervical spine was performed following the standard protocol without intravenous contrast.  Multiplanar CT image reconstructions of the cervical spine were also generated.  Comparison:   None  CT HEAD  Findings: Diffuse cerebral atrophy.  Low attenuation changes in the deep white matter consistent with small vessel ischemia.  No mass effect or midline shift.  No abnormal extra-axial fluid collections.  Gray-white matter junctions are distinct.  Basal cisterns are not effaced.  No evidence of acute intracranial hemorrhage.  Vascular calcifications.  No depressed skull fractures.  Visualized paranasal sinuses are not opacified.  There is a focal subcutaneous soft tissue swelling over the right nasal bridge with focal subcutaneous gas.  This is incompletely included on the study and suggests a soft tissue hematoma with laceration.  IMPRESSION: Chronic atrophy and small vessel ischemic changes.  No evidence of acute intracranial hemorrhage, mass lesion, or acute infarct.  CT CERVICAL SPINE  Findings: Normal alignment of the cervical vertebrae and facet joints.  Degenerative changes throughout the cervical spine with mild narrowing of cervical interspaces and endplate hypertrophic changes.  Ligamentous calcification posteriorly with disc osteophyte complexes at C3-4, C4-5, C5-6, C6-7, and C7-T1 levels causing mild  effacement the anterior thecal sac.  Changes are most prominent at C6-7 and C7-C1. No vertebral compression deformities. No prevertebral soft tissue swelling.  Lateral masses of C1 appear symmetrical.  The odontoid process appears intact.  No focal bone lesion or bone destruction.  Bone cortex and trabecular architecture appear intact.  Mild curvature of the cervical spine convex towards the left, likely positional.  Muscle spasm can also have this appearance.  No paraspinal soft tissue infiltration. Calcifications in the cervical carotid arteries.  IMPRESSION: Degenerative changes of the cervical spine.  No displaced fractures identified.  Original Report Authenticated By: Marlon Pel, M.D.   I personally reviewed his CT scan  1. Fall   2. Laceration of face   3. Minor head injury       MDM  Loretta patient's laceration of her 4 head.  CT head and C-spine pending at this time.  If all sounds mechanical in nature. vital signs are normal  6:51 AM Patient continues to feel well.  Laceration repaired.  Infection warnings given.  Awaiting for followup read of CT head and C-spine.        Lyanne Co, MD 05/22/11 [phone]

## 2011-05-22 NOTE — ED Notes (Signed)
MD at bedside. Campos  

## 2011-05-22 NOTE — ED Notes (Signed)
[facility] has been called for pt 

## 2011-05-22 NOTE — ED Notes (Signed)
Report given to [facility].

## 2011-06-20 ENCOUNTER — Inpatient Hospital Stay (HOSPITAL_COMMUNITY)
Admission: EM | Admit: 2011-06-20 | Discharge: 2011-06-23 | DRG: 690 | Disposition: A | Discharge status: Home / Self Care | Payer: 59 | Attending: Internal Medicine | Admitting: Internal Medicine

## 2011-06-20 ENCOUNTER — Encounter (HOSPITAL_COMMUNITY): Payer: Self-pay | Admitting: *Deleted

## 2011-06-20 DIAGNOSIS — Z86711 Personal history of pulmonary embolism: Secondary | ICD-10-CM | POA: Diagnosis present

## 2011-06-20 DIAGNOSIS — A498 Other bacterial infections of unspecified site: Secondary | ICD-10-CM | POA: Diagnosis present

## 2011-06-20 DIAGNOSIS — M069 Rheumatoid arthritis, unspecified: Secondary | ICD-10-CM | POA: Diagnosis present

## 2011-06-20 DIAGNOSIS — N390 Urinary tract infection, site not specified: Principal | ICD-10-CM | POA: Diagnosis present

## 2011-06-20 DIAGNOSIS — K5900 Constipation, unspecified: Secondary | ICD-10-CM | POA: Diagnosis present

## 2011-06-20 DIAGNOSIS — Z993 Dependence on wheelchair: Secondary | ICD-10-CM

## 2011-06-20 DIAGNOSIS — Z8543 Personal history of malignant neoplasm of ovary: Secondary | ICD-10-CM

## 2011-06-20 DIAGNOSIS — E162 Hypoglycemia, unspecified: Secondary | ICD-10-CM | POA: Diagnosis present

## 2011-06-20 DIAGNOSIS — Z96649 Presence of unspecified artificial hip joint: Secondary | ICD-10-CM

## 2011-06-20 DIAGNOSIS — E1169 Type 2 diabetes mellitus with other specified complication: Secondary | ICD-10-CM | POA: Diagnosis present

## 2011-06-20 DIAGNOSIS — Z7401 Bed confinement status: Secondary | ICD-10-CM

## 2011-06-20 DIAGNOSIS — E785 Hyperlipidemia, unspecified: Secondary | ICD-10-CM | POA: Diagnosis present

## 2011-06-20 DIAGNOSIS — Z794 Long term (current) use of insulin: Secondary | ICD-10-CM

## 2011-06-20 DIAGNOSIS — L8991 Pressure ulcer of unspecified site, stage 1: Secondary | ICD-10-CM | POA: Diagnosis present

## 2011-06-20 DIAGNOSIS — L89109 Pressure ulcer of unspecified part of back, unspecified stage: Secondary | ICD-10-CM | POA: Diagnosis present

## 2011-06-20 DIAGNOSIS — R509 Fever, unspecified: Secondary | ICD-10-CM | POA: Diagnosis present

## 2011-06-20 HISTORY — DX: Other pulmonary embolism without acute cor pulmonale: I26.99

## 2011-06-20 HISTORY — DX: Rheumatoid arthritis, unspecified: M06.9

## 2011-06-20 HISTORY — DX: Hyperlipidemia, unspecified: E78.5

## 2011-06-20 HISTORY — DX: Malignant neoplasm of unspecified ovary: C56.9

## 2011-06-20 MED ORDER — DEXTROSE 50 % IV SOLN
INTRAVENOUS | Status: AC
  Administered 2011-06-20
  Filled 2011-06-20: qty 50

## 2011-06-20 NOTE — ED Notes (Signed)
Bed:WA17<BR> Expected date:<BR> Expected time:<BR> Means of arrival:<BR> Comments:<BR>

## 2011-06-20 NOTE — ED Notes (Addendum)
Per EMS: pt coming from home with c/o of aloc and hypoglycemia. Family could not reach pt at home so they called ems. ems reports pt's cbg upon arrival was 21. Pt given 25g d50, juice, bites of peanut butter sandwich. Pt's cbg upon arrival to ED was 88. Pt is now A&O to person. Denies any pain. Family at bedside

## 2011-06-21 ENCOUNTER — Inpatient Hospital Stay (HOSPITAL_COMMUNITY): Payer: 59

## 2011-06-21 ENCOUNTER — Other Ambulatory Visit: Payer: Self-pay

## 2011-06-21 ENCOUNTER — Emergency Department (HOSPITAL_COMMUNITY): Payer: 59

## 2011-06-21 ENCOUNTER — Encounter (HOSPITAL_COMMUNITY): Payer: Self-pay | Admitting: Emergency Medicine

## 2011-06-21 DIAGNOSIS — Z993 Dependence on wheelchair: Secondary | ICD-10-CM

## 2011-06-21 DIAGNOSIS — E162 Hypoglycemia, unspecified: Secondary | ICD-10-CM | POA: Diagnosis present

## 2011-06-21 DIAGNOSIS — N390 Urinary tract infection, site not specified: Secondary | ICD-10-CM | POA: Diagnosis present

## 2011-06-21 DIAGNOSIS — Z8543 Personal history of malignant neoplasm of ovary: Secondary | ICD-10-CM

## 2011-06-21 DIAGNOSIS — M069 Rheumatoid arthritis, unspecified: Secondary | ICD-10-CM | POA: Diagnosis present

## 2011-06-21 DIAGNOSIS — R509 Fever, unspecified: Secondary | ICD-10-CM | POA: Diagnosis present

## 2011-06-21 DIAGNOSIS — E785 Hyperlipidemia, unspecified: Secondary | ICD-10-CM | POA: Diagnosis present

## 2011-06-21 DIAGNOSIS — Z86711 Personal history of pulmonary embolism: Secondary | ICD-10-CM | POA: Diagnosis present

## 2011-06-21 HISTORY — DX: Personal history of malignant neoplasm of ovary: Z85.43

## 2011-06-21 LAB — CBC
HCT: 37.2 % (ref 36.0–46.0)
MCH: 27.2 pg (ref 26.0–34.0)
MCV: 83 fL (ref 78.0–100.0)
Platelets: 167 10*3/uL (ref 150–400)
Platelets: 150 10*3/uL (ref 150–400)
RBC: 4.48 MIL/uL (ref 3.87–5.11)
RBC: 3.82 MIL/uL — ABNORMAL LOW (ref 3.87–5.11)
RDW: 14.6 % (ref 11.5–15.5)
WBC: 7.5 10*3/uL (ref 4.0–10.5)
WBC: 6.1 10*3/uL (ref 4.0–10.5)

## 2011-06-21 LAB — DIFFERENTIAL
Basophils Relative: 0 % (ref 0–1)
Eosinophils Relative: 2 % (ref 0–5)
Lymphs Abs: 0.8 10*3/uL (ref 0.7–4.0)
Monocytes Absolute: 0.4 10*3/uL (ref 0.1–1.0)
Monocytes Relative: 5 % (ref 3–12)
Neutro Abs: 6.1 10*3/uL (ref 1.7–7.7)

## 2011-06-21 LAB — COMPREHENSIVE METABOLIC PANEL
AST: 21 U/L (ref 0–37)
BUN: 16 mg/dL (ref 6–23)
CO2: 24 mEq/L (ref 19–32)
Calcium: 8.9 mg/dL (ref 8.4–10.5)
Chloride: 104 mEq/L (ref 96–112)
Creatinine, Ser: 1.17 mg/dL — ABNORMAL HIGH (ref 0.50–1.10)
GFR calc Af Amer: 47 mL/min — ABNORMAL LOW (ref >90)
GFR calc non Af Amer: 41 mL/min — ABNORMAL LOW (ref >90)
Glucose, Bld: 54 mg/dL — ABNORMAL LOW (ref 70–99)
Total Bilirubin: 0.3 mg/dL (ref 0.3–1.2)

## 2011-06-21 LAB — GLUCOSE, CAPILLARY
Glucose-Capillary: 68 mg/dL — ABNORMAL LOW (ref 70–99)
Glucose-Capillary: 65 mg/dL — ABNORMAL LOW (ref 70–99)
Glucose-Capillary: 175 mg/dL — ABNORMAL HIGH (ref 70–99)
Glucose-Capillary: 154 mg/dL — ABNORMAL HIGH (ref 70–99)
Glucose-Capillary: 106 mg/dL — ABNORMAL HIGH (ref 70–99)
Glucose-Capillary: 88 mg/dL (ref 70–99)
Glucose-Capillary: 121 mg/dL — ABNORMAL HIGH (ref 70–99)
Glucose-Capillary: 114 mg/dL — ABNORMAL HIGH (ref 70–99)

## 2011-06-21 LAB — POCT I-STAT EG7
Bicarbonate: 25.2 mEq/L — ABNORMAL HIGH (ref 20.0–24.0)
Calcium, Ion: 1.14 mmol/L (ref 1.12–1.32)
Potassium: 3.8 mEq/L (ref 3.5–5.1)
Sodium: 139 mEq/L (ref 135–145)
TCO2: 26 mmol/L (ref 0–100)

## 2011-06-21 LAB — BASIC METABOLIC PANEL
Calcium: 8 mg/dL — ABNORMAL LOW (ref 8.4–10.5)
Chloride: 103 mEq/L (ref 96–112)
Creatinine, Ser: 1.11 mg/dL — ABNORMAL HIGH (ref 0.50–1.10)
GFR calc Af Amer: 51 mL/min — ABNORMAL LOW (ref >90)
Sodium: 135 mEq/L (ref 135–145)

## 2011-06-21 LAB — URINE MICROSCOPIC-ADD ON

## 2011-06-21 LAB — URINALYSIS, ROUTINE W REFLEX MICROSCOPIC
Bilirubin Urine: NEGATIVE
Glucose, UA: NEGATIVE mg/dL
Ketones, ur: NEGATIVE mg/dL
Nitrite: POSITIVE — AB
Protein, ur: 100 mg/dL — AB
pH: 6.5 (ref 5.0–8.0)

## 2011-06-21 LAB — PROCALCITONIN: Procalcitonin: 0.19 ng/mL

## 2011-06-21 MED ORDER — DEXTROSE-NACL 5-0.45 % IV SOLN
INTRAVENOUS | Status: DC
  Administered 2011-06-21: 04:00:00 via INTRAVENOUS

## 2011-06-21 MED ORDER — DEXTROSE 50 % IV SOLN
1.0000 | Freq: Once | INTRAVENOUS | Status: AC
  Administered 2011-06-21: 50 mL via INTRAVENOUS
  Filled 2011-06-21: qty 50

## 2011-06-21 MED ORDER — DEXTROSE 5 % IV SOLN
1.0000 g | Freq: Once | INTRAVENOUS | Status: AC
  Administered 2011-06-21: 1 g via INTRAVENOUS
  Filled 2011-06-21: qty 10

## 2011-06-21 MED ORDER — ACETAMINOPHEN 325 MG PO TABS: 650.0000 mg | ORAL_TABLET | Freq: Four times a day (QID) | ORAL | Status: DC | PRN

## 2011-06-21 MED ORDER — ENOXAPARIN SODIUM 40 MG/0.4ML ~~LOC~~ SOLN
40.0000 mg | SUBCUTANEOUS | Status: DC
  Administered 2011-06-21 – 2011-06-22 (×2): 40 mg via SUBCUTANEOUS
  Filled 2011-06-21 (×4): qty 0.4

## 2011-06-21 MED ORDER — DEXTROSE 5 % IV SOLN
1.0000 g | INTRAVENOUS | Status: DC
  Administered 2011-06-21 – 2011-06-22 (×2): 1 g via INTRAVENOUS
  Filled 2011-06-21 (×4): qty 10

## 2011-06-21 MED ORDER — ACETAMINOPHEN 650 MG RE SUPP: 650.0000 mg | Freq: Four times a day (QID) | RECTAL | Status: DC | PRN

## 2011-06-21 MED ORDER — ZINC GLUCONATE 50 MG PO TABS: 50.0000 mg | ORAL_TABLET | Freq: Every day | ORAL | Status: DC

## 2011-06-21 MED ORDER — OXYBUTYNIN CHLORIDE 5 MG PO TABS
5.0000 mg | ORAL_TABLET | Freq: Every day | ORAL | Status: DC
  Administered 2011-06-21 – 2011-06-23 (×3): 5 mg via ORAL
  Filled 2011-06-21 (×4): qty 1

## 2011-06-21 MED ORDER — ZINC SULFATE 220 (50 ZN) MG PO CAPS
220.0000 mg | ORAL_CAPSULE | Freq: Every day | ORAL | Status: DC
  Administered 2011-06-21 – 2011-06-23 (×3): 220 mg via ORAL
  Filled 2011-06-21 (×4): qty 1

## 2011-06-21 MED ORDER — DEXTROSE-NACL 5-0.45 % IV SOLN
INTRAVENOUS | Status: DC
  Administered 2011-06-21 – 2011-06-23 (×4): via INTRAVENOUS

## 2011-06-21 MED ORDER — INSULIN ASPART 100 UNIT/ML ~~LOC~~ SOLN
0.0000 [IU] | SUBCUTANEOUS | Status: DC
  Administered 2011-06-22: 5 [IU] via SUBCUTANEOUS
  Administered 2011-06-22: 8 [IU] via SUBCUTANEOUS
  Administered 2011-06-22: 2 [IU] via SUBCUTANEOUS

## 2011-06-21 MED ORDER — PANTOPRAZOLE SODIUM 40 MG PO TBEC
40.0000 mg | DELAYED_RELEASE_TABLET | Freq: Every day | ORAL | Status: DC
  Administered 2011-06-21 – 2011-06-23 (×3): 40 mg via ORAL
  Filled 2011-06-21 (×4): qty 1

## 2011-06-21 MED ORDER — ENOXAPARIN SODIUM 30 MG/0.3ML ~~LOC~~ SOLN
30.0000 mg | SUBCUTANEOUS | Status: DC
  Filled 2011-06-21: qty 0.3

## 2011-06-21 MED ORDER — HYDROCODONE-ACETAMINOPHEN 5-325 MG PO TABS: 1.0000 | ORAL_TABLET | ORAL | Status: DC | PRN

## 2011-06-21 MED ORDER — CEPHALEXIN 500 MG PO CAPS
500.0000 mg | ORAL_CAPSULE | Freq: Once | ORAL | Status: DC
  Filled 2011-06-21: qty 1

## 2011-06-21 MED ORDER — INSULIN ASPART 100 UNIT/ML ~~LOC~~ SOLN
0.0000 [IU] | SUBCUTANEOUS | Status: DC
  Administered 2011-06-21: 2 [IU] via SUBCUTANEOUS
  Administered 2011-06-21: 3 [IU] via SUBCUTANEOUS

## 2011-06-21 MED ORDER — BUSPIRONE HCL 15 MG PO TABS
15.0000 mg | ORAL_TABLET | Freq: Two times a day (BID) | ORAL | Status: DC
  Administered 2011-06-21 – 2011-06-23 (×5): 15 mg via ORAL
  Filled 2011-06-21 (×8): qty 1

## 2011-06-21 NOTE — Progress Notes (Signed)
Subjective: Patient feeling a little bit better. She has complaints of chronic buttock pain from being on her backside while in the bed. She denies a shortness of breath or other complaints. Nursing notes at times she appears to have episodes of confusion mild.  Objective: Weight change:   Intake/Output Summary (Last 24 hours) at 06/21/11 1514 Last data filed at 06/21/11 1510  Gross per 24 hour  Intake    490 ml  Output   1000 ml  Net   -510 ml    Filed Vitals:   06/21/11 1457  BP: 101/63  Pulse: 53  Temp: 100 F (37.8 C)  Resp: 20   General: Alert and oriented x2, no acute distress, fatigued, looks about stated age HEENT, mass effect, atraumatic, mucous her meds are dry Cardiovascular: Occasional ectopic beat, regular rate and rhythm S1-S2 Lungs: Decreased breath sounds throughout secondary to body habitus Abdomen: Soft, obese, nondistended, positive bowel sounds Extremities: No clubbing or cyanosis, 1-2+ pitting edema from the knees down. No erythema. Backside: Stage I decub. No signs of any active infection  Lab Results: Basic Metabolic Panel:  Basename 06/21/11 0541 06/21/11 0159 06/21/11 0135  NA 135 139 --  K 3.8 3.8 --  CL 103 -- 104  CO2 23 -- 24  GLUCOSE 172* -- 54*  BUN 15 -- 16  CREATININE 1.11* -- 1.17*  CALCIUM 8.0* -- 8.9  MG -- -- --  PHOS -- -- --   Liver Function Tests:  [facility] 06/21/11 0135  AST 21  ALT 12  ALKPHOS 101  BILITOT 0.3  PROT 7.6  ALBUMIN 2.3*    Basename 06/21/11 0135  LIPASE 21  AMYLASE --   CBC:  Basename 06/21/11 0541 06/21/11 0159 06/21/11 0135  WBC 6.1 -- 7.5  NEUTROABS -- -- 6.1  HGB 10.2* 12.2 --  HCT 31.5* 36.0 --  MCV 82.5 -- 83.0  PLT 150 -- 167   Cardiac Enzymes:  Basename 06/21/11 0136  CKTOTAL --  CKMB --  CKMBINDEX --  TROPONINI <0.30   BNP:  Basename 06/21/11 0540  PROBNP 1368.0*   CBG:  Basename 06/21/11 0418 06/21/11 0245 06/21/11 0128 06/20/11 2352  GLUCAP 175* 65* 68* 94     Urinalysis:  Basename 06/21/11 0141  COLORURINE YELLOW  LABSPEC 1.016  PHURINE 6.5  GLUCOSEU NEGATIVE  HGBUR LARGE*  BILIRUBINUR NEGATIVE  KETONESUR NEGATIVE  PROTEINUR 100*  UROBILINOGEN 1.0  NITRITE POSITIVE*  LEUKOCYTESUR LARGE*    Studies/Results:  Dg Chest Port 1 View 06/21/2011  IMPRESSION: Improved interstitial pulmonary edema superimposed upon COPD/emphysema.  No new abnormalities.     Medications: Scheduled Meds:   . busPIRone  15 mg Oral BID  . cefTRIAXone (ROCEPHIN)  IV  1 g Intravenous Once  . cefTRIAXone (ROCEPHIN)  IV  1 g Intravenous Q24H  . dextrose  1 ampule Intravenous Once  . dextrose      . enoxaparin  40 mg Subcutaneous Q24H  . insulin aspart  0-15 Units Subcutaneous Q2H  . oxybutynin  5 mg Oral Daily  . pantoprazole  40 mg Oral Q1200  . zinc sulfate  220 mg Oral Daily  . DISCONTD: cephALEXin  500 mg Oral Once  . DISCONTD: enoxaparin  30 mg Subcutaneous Q24H  . DISCONTD: zinc gluconate  50 mg Oral Daily   Continuous Infusions:   . dextrose 5 % and 0.45% NaCl 75 mL/hr at 06/21/11 0433  . DISCONTD: dextrose 5 % and 0.45% NaCl Stopped (06/21/11 0431)   PRN  Meds:.acetaminophen, acetaminophen, HYDROcodone-acetaminophen  Assessment/Plan: Patient Active Hospital Problem List: Urinary tract infection (06/21/2011)  on IV Rocephin. Patient still having low-grade temperatures-we'll look for urine cultures closely. See below.  Hypoglycemia (06/21/2011)  likely from urinary tract infection plus mild dehydration. Will monitor closely.  Hyperlipidemia (06/21/2011)  stable.  Rheumatoid arthritis (06/21/2011)  stable.  Hx of ovarian cancer (06/21/2011)  stable.  Hx pulmonary embolism (06/21/2011)  not a Coumadin candidate.  Fever (06/21/2011)  unclear etiology. Reportedly, no signs of aspiration. Chest x-ray unremarkable other than mild edema. I've added Zithromax to cover for possible pneumonia not seen secondary to pulmonary edema. Border  abdominal x-ray to look for constipation and ileus and/or bacterial translocation. She continues to have issues, we'll consider getting a swallow evaluation.  Wheelchair bound (06/21/2011)  noted.   LOS: 1 day   [name] 06/21/2011, 3:14 PM

## 2011-06-21 NOTE — ED Provider Notes (Signed)
History     CSN: [id number]  Arrival date & time 06/20/11  2351   First MD Initiated Contact with Patient 06/21/11 0000      Chief Complaint  Patient presents with  . Hypoglycemia    (Consider location/radiation/quality/duration/timing/severity/associated sxs/prior treatment) HPI Patient is an 78 year old female with a history of diabetes who presents today after having a hypoglycemic event. Per family patient was found slumped over with a glucose of 21. She denies any fevers or pain. Patient denies any chest pain, abdominal pain, or shortness of breath. She denies recent cough. Patient is unable to tell me the year and reports that it is November or December when asked the month. She does know her full name, that we are in a hospital, and who the president is. She presents with her granddaughter who reports that the patient has had some decline in her memory in recent months. Patient denies any urinary symptoms including increased urinary frequency, dysuria, or hematuria. There are no other associated or modifying factors. Past Medical History  Diagnosis Date  . Diabetes mellitus   . PE (pulmonary embolism) 2011  . Ovarian cancer   . Diabetes mellitus   . Hyperlipidemia   . Rheumatoid arthritis     Past Surgical History  Procedure Date  . Total hip arthroplasty     Family History  Problem Relation Age of Onset  . Diabetes type II      History  Substance Use Topics  . Smoking status: Former Games developer  . Smokeless tobacco: Former Neurosurgeon  . Alcohol Use: No    OB History    Grav Para Term Preterm Abortions TAB SAB Ect Mult Living                  Review of Systems  Constitutional: Negative.   HENT: Negative.   Eyes: Negative.   Respiratory: Negative.   Cardiovascular: Negative.   Gastrointestinal: Negative.   Genitourinary: Negative.   Musculoskeletal: Negative.   Neurological: Negative.   Hematological: Negative.   Psychiatric/Behavioral: Negative.   All other  systems reviewed and are negative.    Allergies  Review of patient's allergies indicates no known allergies.  Home Medications  No current outpatient prescriptions on file.  BP 92/53  Pulse 86  Temp(Src) 100.3 F (37.9 C) (Oral)  Resp 20  Ht 5\' 6"  (1.676 m)  Wt 182 lb 5.1 oz (82.7 kg)  BMI 29.43 kg/m2  SpO2 98%  Physical Exam  Nursing note and vitals reviewed. GEN: Well-developed, well-nourished female in no distress HEENT: Atraumatic, normocephalic.  EYES: PERRLA BL, no scleral icterus. NECK: Trachea midline, no meningismus CV: regular rate and rhythm. No murmurs, rubs, or gallops PULM: No respiratory distress.  No crackles, wheezes, or rales. GI: soft, non-tender. No guarding, rebound, or tenderness. + bowel sounds  GU: deferred Neuro: cranial nerves 2-12 intact, no abnormalities of strength or sensation, A and O x 2, not oriented to time MSK: Patient moves all 4 extremities symmetrically, no deformity, edema, or injury noted Skin: No rashes petechiae, purpura, or jaundice Psych: no abnormality of mood   ED Course  Procedures (including critical care time)   Date: 06/21/2011  Rate: 66  Rhythm: normal sinus rhythm  QRS Axis: left  Intervals: normal  ST/T Wave abnormalities: nonspecific ST/T changes  Conduction Disutrbances:left bundle branch block  Narrative Interpretation:   Old EKG Reviewed: unchanged   Labs Reviewed  DIFFERENTIAL - Abnormal; Notable for the following:    Neutrophils Relative 82 (*)  Lymphocytes Relative 11 (*)    All other components within normal limits  COMPREHENSIVE METABOLIC PANEL - Abnormal; Notable for the following:    Glucose, Bld 54 (*)    Creatinine, Ser 1.17 (*)    Albumin 2.3 (*)    GFR calc non Af Amer 41 (*)    GFR calc Af Amer 47 (*)    All other components within normal limits  URINALYSIS, ROUTINE W REFLEX MICROSCOPIC - Abnormal; Notable for the following:    APPearance TURBID (*)    Hgb urine dipstick LARGE (*)      Protein, ur 100 (*)    Nitrite POSITIVE (*)    Leukocytes, UA LARGE (*)    All other components within normal limits  GLUCOSE, CAPILLARY - Abnormal; Notable for the following:    Glucose-Capillary 68 (*)    All other components within normal limits  POCT I-STAT 7, (EG7 V) - Abnormal; Notable for the following:    pH, Ven 7.389 (*)    pCO2, Ven 42.0 (*)    Bicarbonate 25.2 (*)    All other components within normal limits  GLUCOSE, CAPILLARY - Abnormal; Notable for the following:    Glucose-Capillary 65 (*)    All other components within normal limits  GLUCOSE, CAPILLARY - Abnormal; Notable for the following:    Glucose-Capillary 175 (*)    All other components within normal limits  BASIC METABOLIC PANEL - Abnormal; Notable for the following:    Glucose, Bld 172 (*)    Creatinine, Ser 1.11 (*)    Calcium 8.0 (*)    GFR calc non Af Amer 44 (*)    GFR calc Af Amer 51 (*)    All other components within normal limits  CBC - Abnormal; Notable for the following:    RBC 3.82 (*)    Hemoglobin 10.2 (*)    HCT 31.5 (*)    All other components within normal limits  GLUCOSE, CAPILLARY  CBC  LIPASE, BLOOD  LACTIC ACID, PLASMA  PROCALCITONIN  TROPONIN I  URINE MICROSCOPIC-ADD ON  CULTURE, BLOOD (ROUTINE X 2)  CULTURE, BLOOD (ROUTINE X 2)  URINE CULTURE   Dg Chest Port 1 View  06/21/2011  *RADIOLOGY REPORT*  Clinical Data: Shortness of breath.  Follow up mild interstitial edema.  PORTABLE CHEST - 1 VIEW 06/21/2011 0555 hours:  Comparison: Portable chest x-ray earlier same date 0204 hours [facility], 12/19/2009 [facility], and two-view chest x-ray 12/21/2009 [facility].  Findings: Cardiac silhouette mildly enlarged but stable.  Mild diffuse interstitial pulmonary edema superimposed upon baseline COPD, improved since yesterday.  No new pulmonary parenchymal abnormalities.  Stable chronic elevation of the right hemidiaphragm.  Interposition of the hepatic flexure  of the colon between the liver and the right hemidiaphragm, with a large amount of stool in the hepatic flexure, mimicking a right basilar opacity.  IMPRESSION: Improved interstitial pulmonary edema superimposed upon COPD/emphysema.  No new abnormalities.  Original Report Authenticated By: Arnell Sieving, M.D.   Dg Chest Portable 1 View  06/21/2011  *RADIOLOGY REPORT*  Clinical Data: Hypoglycemia; productive cough.  PORTABLE CHEST - 1 VIEW  Comparison: Chest radiograph performed 12/21/2009  Findings: The lungs are mildly hypoexpanded.  Vascular congestion is again noted, with increased interstitial markings, likely reflecting mild interstitial edema.  No definite pleural effusion or pneumothorax is seen.  The cardiomediastinal silhouette is normal in size; calcification is noted within the aortic arch.  No acute osseous abnormalities are seen.  IMPRESSION: Lungs mildly hypoexpanded; vascular congestion, with increased interstitial markings, likely reflecting mild interstitial edema.  Original Report Authenticated By: Tonia Ghent, M.D.     1. UTI (urinary tract infection)   2. Hypoglycemia       MDM  Patient was evaluated by myself. Though hemodynamically stable and normal glycemic on presentation patient did require workup for causes of hypoglycemia. She did have evidence of urinary tract infection today. Patient continued to have drops in her blood sugar despite dextrose as well as oral intake. Patient was given a dose of Rocephin and will be admitted for further management. Remainder of workup was unremarkable. Patient will be admitted for further management.        Cyndra Numbers, MD 06/21/11 [phone]

## 2011-06-21 NOTE — H&P (Signed)
PCP:   Laurena Slimmer, MD, MD   Chief Complaint:  Confusion  HPI: This is a 78 year old female who is wheelchair-bound she lives alone. This evening her son was calling her and did not receive an answer. He went to the house which is not far away. He found his mother confused and hypersomnolent. 911 was called, she was brought to the ER.  Enroute fingerstick blood sugars was found to be 21. In the ER patient was found to have a UTI. Patient is now more alert during my interview. She reports no fevers, chills, vomiting. She does report nausea and altered mentation. She does have a history recurrent UTIs  Patient is bed bound, she uses a wheelchair. This is from a previous hip fracture. She had ORIF but does not walk. Patient's appetite is been quite poor recently. History provided the patient and her granddaughter who is at her bedside   Review of Systems: positives bolded   anorexia, fever, weight loss,, vision loss, decreased hearing, hoarseness, chest pain, syncope, dyspnea on exertion, peripheral edema, balance deficits, hemoptysis, abdominal pain, melena, hematochezia, severe indigestion/heartburn, hematuria, incontinence, genital sores, muscle weakness, suspicious skin lesions, transient blindness, difficulty walking, depression, unusual weight change, abnormal bleeding, enlarged lymph nodes, angioedema, and breast masses.  Past Medical History: Past Medical History  Diagnosis Date  . Diabetes mellitus   . PE (pulmonary embolism) 2011  . Ovarian cancer   . Diabetes mellitus   . Hyperlipidemia   . Rheumatoid arthritis    Past Surgical History  Procedure Date  . Total hip arthroplasty     Medications: Prior to Admission medications   Medication Sig Start Date End Date Taking? Authorizing Provider  busPIRone (BUSPAR) 15 MG tablet Take 15 mg by mouth 2 (two) times daily.   Yes Historical Provider, MD  doxycycline (VIBRA-TABS) 100 MG tablet Take 100 mg by mouth 2 (two) times daily.  For 10 days 06/19/11 06/28/11 Yes Historical Provider, MD  glimepiride (AMARYL) 2 MG tablet Take 2 mg by mouth daily before breakfast.   Yes Historical Provider, MD  omeprazole (PRILOSEC) 20 MG capsule Take 20 mg by mouth daily.   Yes Historical Provider, MD  oxybutynin (DITROPAN) 5 MG tablet Take 5 mg by mouth daily.   Yes Historical Provider, MD  simvastatin (ZOCOR) 20 MG tablet Take 20 mg by mouth every evening.   Yes Historical Provider, MD  vitamin E 200 UNIT capsule Take 200 Units by mouth daily.   Yes Historical Provider, MD  zinc gluconate 50 MG tablet Take 50 mg by mouth daily.   Yes Historical Provider, MD    Allergies:  No Known Allergies  Social History:  reports that she has quit smoking. She has quit using smokeless tobacco. She reports that she does not drink alcohol or use illicit drugs. 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 Diabetes type II      Physical Exam: Filed Vitals:   06/20/11 2354 06/21/11 0311 06/21/11 0348  BP: 105/38 112/52   Pulse: 71 83 86  Temp: 100 F (37.8 C) 101.2 F (38.4 C) 100.8 F (38.2 C)  TempSrc: Oral Oral Rectal  Resp: 16 22 20   SpO2: 95% 99% 98%    General:  Alert and oriented times three, well developed and nourished, no acute distress, weak Eyes: PERRLA, pink conjunctiva, no scleral icterus ENT: Moist oral mucosa, neck supple, no thyromegaly Lungs: clear  to ascultation, no wheeze, no crackles, no use of accessory muscles Cardiovascular: regular rate and rhythm, no regurgitation, no gallops, no murmurs. No carotid bruits, no JVD Abdomen: soft, positive BS, mild generalized tenderness to palpation-chronic, non-distended, no organomegaly, not an acute abdomen GU: not examined Neuro: CN II - XII grossly intact, sensation intact Musculoskeletal: strength 1/5 bilaterally lower extremities, no clubbing, cyanosis or edema Skin: no  rash, no subcutaneous crepitation, no decubitus Psych: appropriate patient   Labs on Admission:   [facility] 06/21/11 0159 06/21/11 0135  NA 139 138  K 3.8 3.8  CL -- 104  CO2 -- 24  GLUCOSE -- 54*  BUN -- 16  CREATININE -- 1.17*  CALCIUM -- 8.9  MG -- --  PHOS -- --    Basename 06/21/11 0135  AST 21  ALT 12  ALKPHOS 101  BILITOT 0.3  PROT 7.6  ALBUMIN 2.3*    Basename 06/21/11 0135  LIPASE 21  AMYLASE --    Basename 06/21/11 0159 06/21/11 0135  WBC -- 7.5  NEUTROABS -- 6.1  HGB 12.2 12.2  HCT 36.0 37.2  MCV -- 83.0  PLT -- 167    Basename 06/21/11 0136  CKTOTAL --  CKMB --  CKMBINDEX --  TROPONINI <0.30   No components found with this basename: POCBNP:3 No results found for this basename: DDIMER:2 in the last 72 hours No results found for this basename: HGBA1C:2 in the last 72 hours No results found for this basename: CHOL:2,HDL:2,LDLCALC:2,TRIG:2,CHOLHDL:2,LDLDIRECT:2 in the last 72 hours No results found for this basename: TSH,T4TOTAL,FREET3,T3FREE,THYROIDAB in the last 72 hours No results found for this basename: VITAMINB12:2,FOLATE:2,FERRITIN:2,TIBC:2,IRON:2,RETICCTPCT:2 in the last 72 hours  Micro Results: No results found for this or any previous visit (from the past 240 hour(s)). Results for VENICIA, VANDALL (MRN [id number]) as of 06/21/2011 04:26  Ref. Range 06/21/2011 01:41  Color, Urine Latest Range: YELLOW  YELLOW  APPearance Latest Range: CLEAR  TURBID (A)  Specific Gravity, Urine Latest Range: 1.005-1.030  1.016  pH Latest Range: 5.0-8.0  6.5  Glucose, UA Latest Range: NEGATIVE mg/dL NEGATIVE  Bilirubin Urine Latest Range: NEGATIVE  NEGATIVE  Ketones, ur Latest Range: NEGATIVE mg/dL NEGATIVE  Protein Latest Range: NEGATIVE mg/dL 413 (A)  Urobilinogen, UA Latest Range: 0.0-1.0 mg/dL 1.0  Nitrite Latest Range: NEGATIVE  POSITIVE (A)  Leukocytes, UA Latest Range: NEGATIVE  LARGE (A)  Hgb urine dipstick Latest Range: NEGATIVE  LARGE (A)    Urine-Other No range found FIELD OBSCURED BY WBC'S  WBC, UA Latest Range: <3 WBC/hpf TOO NUMEROUS TO COUNT    Radiological Exams on Admission: Dg Chest Portable 1 View  06/21/2011  *RADIOLOGY REPORT*  Clinical Data: Hypoglycemia; productive cough.  PORTABLE CHEST - 1 VIEW  Comparison: Chest radiograph performed 12/21/2009  Findings: The lungs are mildly hypoexpanded.  Vascular congestion is again noted, with increased interstitial markings, likely reflecting mild interstitial edema.  No definite pleural effusion or pneumothorax is seen.  The cardiomediastinal silhouette is normal in size; calcification is noted within the aortic arch.  No acute osseous abnormalities are seen.  IMPRESSION: Lungs mildly hypoexpanded; vascular congestion, with increased interstitial markings, likely reflecting mild interstitial edema.  Original Report Authenticated By: Tonia Ghent, M.D.    Assessment/Plan Present on Admission:  .Urinary tract infection .Hypoglycemia Admit to MedSurg Blood cultures and urine cultures collected Continue IV Rocephin Hypoglycemia likely due to the infection Dextrose and frequent fingerstick blood sugars ordered. Hypoglycemics and held Mild vascular congestion Monitor closely the patient's fluid administration,  repeat chest x-ray in a.m. History of PE History of uterine cancer History rule out crisis All stable resume home medications, except as stated above a Sacral decubitus present on admission Frequent turns by nursing   DO NOT RESUSCITATE DVT prophylaxis T7/Dr. Juliann Pares, Camani Sesay 06/21/2011, 4:28 AM

## 2011-06-22 LAB — BASIC METABOLIC PANEL
Chloride: 107 mEq/L (ref 96–112)
GFR calc Af Amer: 44 mL/min — ABNORMAL LOW (ref >90)
GFR calc non Af Amer: 38 mL/min — ABNORMAL LOW (ref >90)
Potassium: 3.9 mEq/L (ref 3.5–5.1)
Sodium: 140 mEq/L (ref 135–145)

## 2011-06-22 LAB — GLUCOSE, CAPILLARY
Glucose-Capillary: 134 mg/dL — ABNORMAL HIGH (ref 70–99)
Glucose-Capillary: 269 mg/dL — ABNORMAL HIGH (ref 70–99)
Glucose-Capillary: 239 mg/dL — ABNORMAL HIGH (ref 70–99)

## 2011-06-22 MED ORDER — FLEET ENEMA 7-19 GM/118ML RE ENEM
1.0000 | ENEMA | Freq: Once | RECTAL | Status: AC
  Administered 2011-06-22: 1 via RECTAL
  Filled 2011-06-22: qty 1

## 2011-06-22 MED ORDER — INSULIN ASPART 100 UNIT/ML ~~LOC~~ SOLN
0.0000 [IU] | Freq: Three times a day (TID) | SUBCUTANEOUS | Status: DC
  Administered 2011-06-23: 8 [IU] via SUBCUTANEOUS

## 2011-06-22 MED ORDER — BISACODYL 10 MG RE SUPP
10.0000 mg | Freq: Once | RECTAL | Status: AC
  Administered 2011-06-22: 10 mg via RECTAL
  Filled 2011-06-22: qty 1

## 2011-06-22 MED ORDER — DOCUSATE SODIUM 100 MG PO CAPS
200.0000 mg | ORAL_CAPSULE | Freq: Two times a day (BID) | ORAL | Status: DC
  Administered 2011-06-22 – 2011-06-23 (×2): 200 mg via ORAL
  Filled 2011-06-22 (×6): qty 2

## 2011-06-22 NOTE — Progress Notes (Signed)
Subjective: Patient feeling a little bit better. Has had some bowel movements, physically still some in there. Wants to get out of bed and go home soon.  Objective: Weight change:   Intake/Output Summary (Last 24 hours) at 06/22/11 1532 Last data filed at 06/22/11 0917  Gross per 24 hour  Intake 1566.25 ml  Output   1101 ml  Net 465.25 ml    Filed Vitals:   06/22/11 1524  BP: 138/56  Pulse: 59  Temp: 98.8 F (37.1 C)  Resp: 18   General: Alert and oriented x2, no acute distress, fatigued, looks about stated age HEENT, mass effect, atraumatic, mucous her meds are dry Cardiovascular: Occasional ectopic beat, regular rate and rhythm S1-S2 Lungs: Decreased breath sounds throughout secondary to body habitus Abdomen: Soft, obese, nondistended, positive bowel sounds Extremities: No clubbing or cyanosis, 1-2+ pitting edema from the knees down. No erythema. Backside: Stage I decub. No signs of any active infection  Lab Results: Basic Metabolic Panel:  Basename 06/22/11 0524 06/21/11 0541  NA 140 135  K 3.9 3.8  CL 107 103  CO2 26 23  GLUCOSE 144* 172*  BUN 15 15  CREATININE 1.25* 1.11*  CALCIUM 8.2* 8.0*  MG -- --  PHOS -- --   Liver Function Tests:  Basename 06/21/11 0135  AST 21  ALT 12  ALKPHOS 101  BILITOT 0.3  PROT 7.6  ALBUMIN 2.3*    Basename 06/21/11 0135  LIPASE 21  AMYLASE --   CBC:  Basename 06/21/11 0541 06/21/11 0159 06/21/11 0135  WBC 6.1 -- 7.5  NEUTROABS -- -- 6.1  HGB 10.2* 12.2 --  HCT 31.5* 36.0 --  MCV 82.5 -- 83.0  PLT 150 -- 167   Cardiac Enzymes:  Basename 06/21/11 0136  CKTOTAL --  CKMB --  CKMBINDEX --  TROPONINI <0.30   BNP:  Basename 06/21/11 0540  PROBNP 1368.0*   CBG:  Basename 06/22/11 1210 06/22/11 0742 06/22/11 0426 06/21/11 2321 06/21/11 2040 06/21/11 1835  GLUCAP 269* 134* 140* 114* 121* 123*    Urinalysis:  Basename 06/21/11 0141  COLORURINE YELLOW  LABSPEC 1.016  PHURINE 6.5  GLUCOSEU NEGATIVE    HGBUR LARGE*  BILIRUBINUR NEGATIVE  KETONESUR NEGATIVE  PROTEINUR 100*  UROBILINOGEN 1.0  NITRITE POSITIVE*  LEUKOCYTESUR LARGE*     Medications: Scheduled Meds:    . bisacodyl  10 mg Rectal Once  . busPIRone  15 mg Oral BID  . cefTRIAXone (ROCEPHIN)  IV  1 g Intravenous Q24H  . docusate sodium  200 mg Oral BID  . enoxaparin  40 mg Subcutaneous Q24H  . insulin aspart  0-15 Units Subcutaneous Q4H  . oxybutynin  5 mg Oral Daily  . pantoprazole  40 mg Oral Q1200  . sodium phosphate  1 enema Rectal Once  . zinc sulfate  220 mg Oral Daily  . DISCONTD: insulin aspart  0-15 Units Subcutaneous Q2H   Continuous Infusions:    . dextrose 5 % and 0.45% NaCl 75 mL/hr at 06/22/11 0557   PRN Meds:.acetaminophen, acetaminophen, HYDROcodone-acetaminophen  Assessment/Plan: Patient Active Hospital Problem List: Urinary tract infection (06/21/2011)  on IV Rocephin. Patient still having low-grade temperatures-we'll look for urine cultures closely. See below.  Hypoglycemia (06/21/2011)  likely from urinary tract infection plus mild dehydration. Blood sugars improving. On IV D5  Hyperlipidemia (06/21/2011)  stable.  Rheumatoid arthritis (06/21/2011)  stable.  Hx of ovarian cancer (06/21/2011)  stable.  Hx pulmonary embolism (06/21/2011)  not a Coumadin candidate.  Fever (  06/21/2011)  looks to be from constipation. Aggressive bowel regimen. Continue fleets enema.  Wheelchair bound (06/21/2011)  noted.   LOS: 2 days   [name] 06/22/2011, 3:32 PM

## 2011-06-22 NOTE — Progress Notes (Signed)
Pt refused to let NT check blood sugar (CBG).

## 2011-06-22 NOTE — Progress Notes (Signed)
Patient refusing cbg sticks.  Pt irritated and becoming restless.  CBG's running in low 100's and 120's.  Notified M. Lynch of above. Orders given to change cbg's  To q4hr.  Informed patient.  Patient verbalizes understanding.

## 2011-06-22 NOTE — Progress Notes (Signed)
Dr. Rito Ehrlich made aware via phone pt incontinent  Around foley catheter. MD aware new foley placed last evening and same problem persists. Balloon checked yet change. See new order entered by MD to dc foley.

## 2011-06-22 NOTE — Progress Notes (Signed)
CBG not done at 1032. Pt refused.

## 2011-06-22 NOTE — Progress Notes (Signed)
Spoke with Daphane Shepherd, NP concerning patients CBG orders and desired insulin coverage.  CBG's should be ACHS and insulin coverage at that time without HS coverage.  Order given.  Informed patient and daughter.  Verbalized understanding.

## 2011-06-23 DIAGNOSIS — K5900 Constipation, unspecified: Secondary | ICD-10-CM | POA: Diagnosis present

## 2011-06-23 LAB — BASIC METABOLIC PANEL
Calcium: 8.2 mg/dL — ABNORMAL LOW (ref 8.4–10.5)
Chloride: 105 mEq/L (ref 96–112)
Creatinine, Ser: 1.04 mg/dL (ref 0.50–1.10)
GFR calc Af Amer: 55 mL/min — ABNORMAL LOW (ref >90)
GFR calc non Af Amer: 47 mL/min — ABNORMAL LOW (ref >90)

## 2011-06-23 LAB — GLUCOSE, CAPILLARY

## 2011-06-23 MED ORDER — CEFUROXIME AXETIL 500 MG PO TABS: 500.0000 mg | ORAL_TABLET | Freq: Two times a day (BID) | ORAL | Status: AC

## 2011-06-23 MED ORDER — METRONIDAZOLE 500 MG PO TABS: 500.0000 mg | ORAL_TABLET | Freq: Three times a day (TID) | ORAL | Status: AC

## 2011-06-23 MED ORDER — POLYETHYLENE GLYCOL 3350 17 G PO PACK
17.0000 g | PACK | Freq: Every day | ORAL | Status: DC
  Filled 2011-06-23 (×2): qty 1

## 2011-06-23 MED ORDER — DSS 100 MG PO CAPS: 100.0000 mg | ORAL_CAPSULE | Freq: Two times a day (BID) | ORAL | Status: DC

## 2011-06-23 NOTE — Discharge Summary (Signed)
DISCHARGE SUMMARY  [name]  MR#: [id number]  DOB:1924/04/05  Date of Admission: 06/20/2011 Date of Discharge: 06/23/2011  Attending Physician:Lane Eland K  Patient's NFA:OZHYQ,MVHQION S, MD, MD  Consults: -none  Discharge Diagnoses: Present on Admission:  .Urinary tract infection .Hypoglycemia .Hyperlipidemia .Rheumatoid arthritis .Hx pulmonary embolism .Fever .Constipation    Medication List  As of 06/23/2011  5:43 PM   TAKE these medications         busPIRone 15 MG tablet   Commonly known as: BUSPAR   Take 15 mg by mouth 2 (two) times daily.      cefUROXime 500 MG tablet   Commonly known as: CEFTIN   Take 1 tablet (500 mg total) by mouth 2 (two) times daily.      doxycycline 100 MG tablet   Commonly known as: VIBRA-TABS   Take 100 mg by mouth 2 (two) times daily. For 10 days      DSS 100 MG Caps   Take 100 mg by mouth 2 (two) times daily.      glimepiride 2 MG tablet   Commonly known as: AMARYL   Take 2 mg by mouth daily before breakfast.      metroNIDAZOLE 500 MG tablet   Commonly known as: FLAGYL   Take 1 tablet (500 mg total) by mouth 3 (three) times daily.      omeprazole 20 MG capsule   Commonly known as: PRILOSEC   Take 20 mg by mouth daily.      oxybutynin 5 MG tablet   Commonly known as: DITROPAN   Take 5 mg by mouth daily.      simvastatin 20 MG tablet   Commonly known as: ZOCOR   Take 20 mg by mouth every evening.      vitamin E 200 UNIT capsule   Take 200 Units by mouth daily.      zinc gluconate 50 MG tablet   Take 50 mg by mouth daily.           Initial presentation: Patient is an 78 year old Afro-American female who is wheelchair-bound but lives alone. EMS was called to deliver the patient emergency room after her son reported that the patient was calling acting confused. She was noted by EMS to have hypoglycemia and upon arrival to the emergency room was found to have low-grade temperatures and a significant urinary  tract infection. Patient was started on fluids plus antibiotic hospitals were called for admission.  Hospital Course: .Urinary tract infection: Be limited cultures are positive for Escherichia coli. Patient was started on IV Rocephin which he tolerated well. She was changed over to by mouth antibiotics and was discharged home with several more days to complete a total of 7 days of therapy.  .Hypoglycemia: This is felt to be secondary to dehydration which was leading to decreased clearance of her diabetic medications. She was treated with dextrose IV fluids and her sugars seem improved into the 200s. She will resume her normal home medications.  .Hyperlipidemia: Stable during his hospitalization.  .Rheumatoid arthritis: Stable during his hospitalization. Continued on steroids.  Marland KitchenHx pulmonary embolism: Stable. INR therapeutic.  Marland KitchenFever: After patient was admitted, she continued to have low-grade temperatures around 99-100.9. Other sources of infection were investigated. Chest x-ray was unremarkable. She was found to have a large amount of constipation and her abdomen which is felt to be the source of bacterial translocation infection.  .Constipation: Patient is a problems with severe constipation. She started on Colace 100 twice a day plus  MiraLAX initially and this was not enough she also then got multiple doses of fleets enema until large number of bowel movements. By day of discharge, patient was quite lucid and feeling markedly better. She is being discharged on twice a day Colace plus daily MiraLAX to make sure that she stays regular and does not have problems with backing up again. Because she did have low-grade temps, she's been discharged on several days of Flagyl which along with her cephalosporin should cover for gut coverage.   Day of Discharge BP 123/43  Pulse 76  Temp(Src) 98.1 F (36.7 C) (Oral)  Resp 20  Ht 5\' 6"  (1.676 m)  Wt 82.7 kg (182 lb 5.1 oz)  BMI 29.43 kg/m2  SpO2  99%  Physical Exam: General: Alert and oriented x3, mild distress only from having to move from the bed to chair with maximum assistance, fatigued, looks about stated age HEENT: Normocephalic, atraumatic, mucous members are moist Cardiovascular: Regular rate and rhythm, S1-S2, 2/6 systolic ejection murmur Clear to auscultation bilaterally Abdomen: Soft, obese, nontender, positive bowel sounds Extremities: No clubbing or cyanosis, 1+ pitting edema from the knees down.  Results for orders placed during the hospital encounter of 06/20/11 (from the past 24 hour(s))  GLUCOSE, CAPILLARY     Status: Abnormal   Collection Time   06/22/11  9:20 PM      Component Value Range   Glucose-Capillary 250 (*) 70 - 99 (mg/dL)  BASIC METABOLIC PANEL     Status: Abnormal   Collection Time   06/23/11  4:36 AM      Component Value Range   Sodium 137  135 - 145 (mEq/L)   Potassium 3.7  3.5 - 5.1 (mEq/L)   Chloride 105  96 - 112 (mEq/L)   CO2 22  19 - 32 (mEq/L)   Glucose, Bld 243 (*) 70 - 99 (mg/dL)   BUN 15  6 - 23 (mg/dL)   Creatinine, Ser 4.09  0.50 - 1.10 (mg/dL)   Calcium 8.2 (*) 8.4 - 10.5 (mg/dL)   GFR calc non Af Amer 47 (*) >90 (mL/min)   GFR calc Af Amer 55 (*) >90 (mL/min)  GLUCOSE, CAPILLARY     Status: Abnormal   Collection Time   06/23/11  7:39 AM      Component Value Range   Glucose-Capillary 283 (*) 70 - 99 (mg/dL)   Comment 1 Notify RN     Comment 2 Documented in Chart    GLUCOSE, CAPILLARY     Status: Abnormal   Collection Time   06/23/11 10:48 AM      Component Value Range   Glucose-Capillary 266 (*) 70 - 99 (mg/dL)   Comment 1 Notify RN     Comment 2 Documented in Chart      Disposition: Improved, being discharged home   Follow-up Appts: Discharge Orders    Future Orders Please Complete By Expires   Diet - low sodium heart healthy      Diet Carb Modified      Increase activity slowly         Follow-up Information    Follow up with Laurena Slimmer, MD in 4 weeks.  (As needed)          Tests Needing Follow-up: None  Time spent in discharge (includes decision making & examination of pt): 50 minutes  Signed: Hollice Espy 06/23/2011, 5:43 PM

## 2011-06-23 NOTE — Progress Notes (Signed)
UR complete 

## 2011-06-23 NOTE — Progress Notes (Signed)
Spoke with patient at bedside. States she lives at home with caregivers. She has caregivers daily from 1000-1630 and 2200 to 0000 daily. I confirmed this with her dtr Ms Cletus Gash [phone]). Per her dtr she has a combination of nursing ([facility]) and [facility] services. Patient uses [facility] for transportation. Meds are managed by dtr and home care services. Patient has electric w/c at home. Dtr states she will arrange f/u appt with PCP, Dr. Chestine Spore after d/c. Contacted [facility] to arrange transportation home. Patient asking for bed/mattress replacement at home, states has had x 3-14yrs and in need of repair. Contacted [place] with [facility] to see if patient is eligible for update.

## 2011-06-23 NOTE — Discharge Instructions (Signed)

## 2011-06-24 LAB — URINE CULTURE: Culture  Setup Time: 201303160633

## 2011-06-27 LAB — CULTURE, BLOOD (ROUTINE X 2)
Culture  Setup Time: 201303160607
Culture: NO GROWTH

## 2011-07-01 ENCOUNTER — Encounter (HOSPITAL_COMMUNITY): Payer: Self-pay

## 2011-07-01 ENCOUNTER — Emergency Department (HOSPITAL_COMMUNITY)
Admission: EM | Admit: 2011-07-01 | Discharge: 2011-07-01 | Disposition: A | Discharge status: Home / Self Care | Payer: PRIVATE HEALTH INSURANCE | Attending: Emergency Medicine | Admitting: Emergency Medicine

## 2011-07-01 DIAGNOSIS — M7989 Other specified soft tissue disorders: Secondary | ICD-10-CM | POA: Insufficient documentation

## 2011-07-01 DIAGNOSIS — Z8543 Personal history of malignant neoplasm of ovary: Secondary | ICD-10-CM | POA: Insufficient documentation

## 2011-07-01 DIAGNOSIS — Z86718 Personal history of other venous thrombosis and embolism: Secondary | ICD-10-CM | POA: Insufficient documentation

## 2011-07-01 DIAGNOSIS — S81809A Unspecified open wound, unspecified lower leg, initial encounter: Secondary | ICD-10-CM | POA: Insufficient documentation

## 2011-07-01 DIAGNOSIS — R609 Edema, unspecified: Secondary | ICD-10-CM | POA: Insufficient documentation

## 2011-07-01 DIAGNOSIS — S81811A Laceration without foreign body, right lower leg, initial encounter: Secondary | ICD-10-CM

## 2011-07-01 DIAGNOSIS — E119 Type 2 diabetes mellitus without complications: Secondary | ICD-10-CM | POA: Insufficient documentation

## 2011-07-01 DIAGNOSIS — W2209XA Striking against other stationary object, initial encounter: Secondary | ICD-10-CM | POA: Insufficient documentation

## 2011-07-01 MED ORDER — TETANUS-DIPHTH-ACELL PERTUSSIS 5-2.5-18.5 LF-MCG/0.5 IM SUSP
0.5000 mL | Freq: Once | INTRAMUSCULAR | Status: AC
  Administered 2011-07-01: 0.5 mL via INTRAMUSCULAR
  Filled 2011-07-01: qty 0.5

## 2011-07-01 NOTE — ED Notes (Signed)
Per EMS, caregiver called EMS for 2 1/2-3 inch laceration to right lower leg. States that patient was getting back in bed and cut her leg on the bed frame

## 2011-07-01 NOTE — ED Provider Notes (Signed)
I saw and evaluated the patient, reviewed the resident's note and I agree with the findings and plan.  Pt with wedge shaped laceration right lower extremity lateral aspect.  Adipose tissue visible within the wound but no active bleeding.  Distal n/v intact.  Will repair laceration and discharge home.  Celene Kras, MD 07/01/11 1600

## 2011-07-01 NOTE — ED Provider Notes (Signed)
History     CSN: [id number]  Arrival date & time 07/01/11  1541   First MD Initiated Contact with Patient 07/01/11 1546      Chief Complaint  Patient presents with  . Extremity Laceration    (Consider location/radiation/quality/duration/timing/severity/associated sxs/prior treatment) HPI Comments: 78 yo female who suffered a right lower extremity laceration while a home nurse was trying to help her get back into bed. Right leg hit a sharp metal edge. Unknown last tetanus vaccination. Did not fall or injury any other part of her body.  Patient is a 78 y.o. female presenting with skin laceration. The history is provided by the patient.  Laceration  The incident occurred less than 1 hour ago. The laceration is located on the right leg. The laceration is 4 cm in size. The laceration mechanism was a a metal edge. The pain is mild. The pain has been constant since onset. She reports no foreign bodies present. Her tetanus status is out of date.    Past Medical History  Diagnosis Date  . Diabetes mellitus   . PE (pulmonary embolism) 2011  . Ovarian cancer   . Diabetes mellitus   . Hyperlipidemia   . Rheumatoid arthritis     Past Surgical History  Procedure Date  . Total hip arthroplasty     Family History  Problem Relation Age of Onset  . Diabetes type II      History  Substance Use Topics  . Smoking status: Former Games developer  . Smokeless tobacco: Former Neurosurgeon  . Alcohol Use: No    OB History    Grav Para Term Preterm Abortions TAB SAB Ect Mult Living                  Review of Systems  Constitutional: Negative for fever.  HENT: Negative for congestion and rhinorrhea.   Respiratory: Negative for shortness of breath.   Cardiovascular: Positive for leg swelling (chronic leg swelling). Negative for chest pain.  Gastrointestinal: Negative for nausea and vomiting.  Skin: Positive for wound.  Neurological: Negative for syncope and headaches.  All other systems reviewed and  are negative.    Allergies  Review of patient's allergies indicates no known allergies.  Home Medications   Current Outpatient Rx  Name Route Sig Dispense Refill  . BUSPIRONE HCL 15 MG PO TABS Oral Take 15 mg by mouth 2 (two) times daily.    . DSS 100 MG CAPS Oral Take 100 mg by mouth 2 (two) times daily. 60 capsule 0  . GLIMEPIRIDE 2 MG PO TABS Oral Take 2 mg by mouth daily before breakfast.    . OMEPRAZOLE 20 MG PO CPDR Oral Take 20 mg by mouth daily.    . OXYBUTYNIN CHLORIDE 5 MG PO TABS Oral Take 5 mg by mouth daily.    Marland Kitchen SIMVASTATIN 20 MG PO TABS Oral Take 20 mg by mouth every evening.    Marland Kitchen VITAMIN E 200 UNITS PO CAPS Oral Take 200 Units by mouth daily.    Marland Kitchen ZINC GLUCONATE 50 MG PO TABS Oral Take 50 mg by mouth daily.      BP 115/60  Pulse 78  Temp(Src) 98.7 F (37.1 C) (Oral)  Resp 15  SpO2 98%  Physical Exam  Nursing note and vitals reviewed. Constitutional: She is oriented to person, place, and time. She appears well-developed and well-nourished. No distress.  HENT:  Head: Normocephalic and atraumatic.  Right Ear: External ear normal.  Left Ear: External ear normal.  Nose: Nose normal.  Mouth/Throat: Oropharynx is clear and moist.  Neck: Neck supple.  Cardiovascular: Normal rate, regular rhythm, normal heart sounds and intact distal pulses.   Pulmonary/Chest: Effort normal and breath sounds normal. No respiratory distress. She has no wheezes. She has no rales.  Abdominal: Soft. She exhibits no distension. There is no tenderness.  Musculoskeletal: She exhibits edema (non-pitting lower extremity edema bilaterally).       Legs:      Wedge shaped superficial laceration to lateral right lower leg. No active bleeding. Shallow. No visible foreign bodies  Lymphadenopathy:    She has no cervical adenopathy.  Neurological: She is alert and oriented to person, place, and time.  Skin: Skin is warm and dry. She is not diaphoretic. No pallor.    ED Course  LACERATION  REPAIR Date/Time: 07/01/2011 4:57 PM Performed by: Pricilla Loveless Authorized by: Linwood Dibbles R Consent: Verbal consent obtained. Consent given by: patient Body area: lower extremity Laceration length: 5 cm Contaminated: none. Foreign bodies: no foreign bodies Tendon involvement: none Nerve involvement: none Vascular damage: no Anesthesia: local infiltration Local anesthetic: lidocaine 1% with epinephrine Anesthetic total: 8 ml Patient sedated: no Preparation: Patient was prepped and draped in the usual sterile fashion. Irrigation solution: saline Irrigation method: syringe Amount of cleaning: standard Debridement: none Skin closure: 4-0 Prolene Number of sutures: 13 Technique: simple Approximation: close Approximation difficulty: simple Dressing: 4x4 sterile gauze and antibiotic ointment Patient tolerance: Patient tolerated the procedure well with no immediate complications.   (including critical care time)  Labs Reviewed - No data to display No results found.   1. Laceration of right lower extremity       MDM  78 yo female with wedge-shaped lac to RLE. Superficial, not grossly contaminated. Repaired with simple sutures at bedside, washed out with standard amount. Tdap updated. Discussed wound precautions and caution for possible infection. Will follow up with PCP in 10-14 days for suture removal.        Pricilla Loveless, MD 07/01/11 1851

## 2011-08-28 ENCOUNTER — Emergency Department (HOSPITAL_COMMUNITY): Payer: PRIVATE HEALTH INSURANCE

## 2011-08-28 ENCOUNTER — Emergency Department (HOSPITAL_COMMUNITY)
Admission: EM | Admit: 2011-08-28 | Discharge: 2011-08-28 | Disposition: A | Discharge status: Home / Self Care | Payer: PRIVATE HEALTH INSURANCE | Attending: Emergency Medicine | Admitting: Emergency Medicine

## 2011-08-28 ENCOUNTER — Encounter (HOSPITAL_COMMUNITY): Payer: Self-pay | Admitting: *Deleted

## 2011-08-28 DIAGNOSIS — N390 Urinary tract infection, site not specified: Secondary | ICD-10-CM | POA: Insufficient documentation

## 2011-08-28 DIAGNOSIS — Z86711 Personal history of pulmonary embolism: Secondary | ICD-10-CM | POA: Insufficient documentation

## 2011-08-28 DIAGNOSIS — E119 Type 2 diabetes mellitus without complications: Secondary | ICD-10-CM | POA: Insufficient documentation

## 2011-08-28 DIAGNOSIS — R5381 Other malaise: Secondary | ICD-10-CM | POA: Insufficient documentation

## 2011-08-28 DIAGNOSIS — R42 Dizziness and giddiness: Secondary | ICD-10-CM | POA: Insufficient documentation

## 2011-08-28 DIAGNOSIS — Z96649 Presence of unspecified artificial hip joint: Secondary | ICD-10-CM | POA: Insufficient documentation

## 2011-08-28 DIAGNOSIS — E785 Hyperlipidemia, unspecified: Secondary | ICD-10-CM | POA: Insufficient documentation

## 2011-08-28 DIAGNOSIS — Z79899 Other long term (current) drug therapy: Secondary | ICD-10-CM | POA: Insufficient documentation

## 2011-08-28 DIAGNOSIS — R05 Cough: Secondary | ICD-10-CM | POA: Insufficient documentation

## 2011-08-28 DIAGNOSIS — M069 Rheumatoid arthritis, unspecified: Secondary | ICD-10-CM | POA: Insufficient documentation

## 2011-08-28 DIAGNOSIS — R059 Cough, unspecified: Secondary | ICD-10-CM | POA: Insufficient documentation

## 2011-08-28 DIAGNOSIS — R5383 Other fatigue: Secondary | ICD-10-CM | POA: Insufficient documentation

## 2011-08-28 LAB — CBC
HCT: 36.9 % (ref 36.0–46.0)
Hemoglobin: 11.8 g/dL — ABNORMAL LOW (ref 12.0–15.0)
MCV: 84.4 fL (ref 78.0–100.0)
Platelets: 219 10*3/uL (ref 150–400)
RBC: 4.37 MIL/uL (ref 3.87–5.11)
WBC: 4.9 10*3/uL (ref 4.0–10.5)

## 2011-08-28 LAB — DIFFERENTIAL
Eosinophils Relative: 2 % (ref 0–5)
Lymphocytes Relative: 21 % (ref 12–46)
Lymphs Abs: 1 10*3/uL (ref 0.7–4.0)
Monocytes Absolute: 0.3 10*3/uL (ref 0.1–1.0)
Monocytes Relative: 6 % (ref 3–12)

## 2011-08-28 LAB — BASIC METABOLIC PANEL
CO2: 23 mEq/L (ref 19–32)
Calcium: 9.2 mg/dL (ref 8.4–10.5)
Glucose, Bld: 194 mg/dL — ABNORMAL HIGH (ref 70–99)
Sodium: 139 mEq/L (ref 135–145)

## 2011-08-28 LAB — URINALYSIS, ROUTINE W REFLEX MICROSCOPIC
Glucose, UA: NEGATIVE mg/dL
Protein, ur: NEGATIVE mg/dL

## 2011-08-28 LAB — URINE MICROSCOPIC-ADD ON

## 2011-08-28 LAB — GLUCOSE, CAPILLARY: Glucose-Capillary: 163 mg/dL — ABNORMAL HIGH (ref 70–99)

## 2011-08-28 MED ORDER — CEPHALEXIN 500 MG PO CAPS: 500.0000 mg | ORAL_CAPSULE | Freq: Four times a day (QID) | ORAL | Status: AC

## 2011-08-28 MED ORDER — SODIUM CHLORIDE 0.9 % IV BOLUS (SEPSIS)
250.0000 mL | Freq: Once | INTRAVENOUS | Status: AC
  Administered 2011-08-28: 250 mL via INTRAVENOUS

## 2011-08-28 MED ORDER — DEXTROSE 5 % IV SOLN
1.0000 g | Freq: Once | INTRAVENOUS | Status: AC
  Administered 2011-08-28: 1 g via INTRAVENOUS
  Filled 2011-08-28: qty 10

## 2011-08-28 MED ORDER — SODIUM CHLORIDE 0.9 % IV SOLN: INTRAVENOUS | Status: DC

## 2011-08-28 NOTE — Discharge Instructions (Signed)
Start the antibiotic prescription tomorrow. Get plenty of rest, and drink a lot of fluids. See your Dr. for a checkup in 4 or 5 days.   Urinary Tract Infection Infections of the urinary tract can start in several places. A bladder infection (cystitis), a kidney infection (pyelonephritis), and a prostate infection (prostatitis) are different types of urinary tract infections (UTIs). They usually get better if treated with medicines (antibiotics) that kill germs. Take all the medicine until it is gone. You or your child may feel better in a few days, but TAKE ALL MEDICINE or the infection may not respond and may become more difficult to treat. HOME CARE INSTRUCTIONS   Drink enough water and fluids to keep the urine clear or pale yellow. Cranberry juice is especially recommended, in addition to large amounts of water.   Avoid caffeine, tea, and carbonated beverages. They tend to irritate the bladder.   Alcohol may irritate the prostate.   Only take over-the-counter or prescription medicines for pain, discomfort, or fever as directed by your caregiver.  To prevent further infections:  Empty the bladder often. Avoid holding urine for long periods of time.   After a bowel movement, women should cleanse from front to back. Use each tissue only once.   Empty the bladder before and after sexual intercourse.  FINDING OUT THE RESULTS OF YOUR TEST Not all test results are available during your visit. If your or your child's test results are not back during the visit, make an appointment with your caregiver to find out the results. Do not assume everything is normal if you have not heard from your caregiver or the medical facility. It is important for you to follow up on all test results. SEEK MEDICAL CARE IF:   There is back pain.   Your baby is older than 3 months with a rectal temperature of 100.5 F (38.1 C) or higher for more than 1 day.   Your or your child's problems (symptoms) are no  better in 3 days. Return sooner if you or your child is getting worse.  SEEK IMMEDIATE MEDICAL CARE IF:   There is severe back pain or lower abdominal pain.   You or your child develops chills.   You have a fever.   Your baby is older than 3 months with a rectal temperature of 102 F (38.9 C) or higher.   Your baby is 43 months old or younger with a rectal temperature of 100.4 F (38 C) or higher.   There is nausea or vomiting.   There is continued burning or discomfort with urination.  MAKE SURE YOU:   Understand these instructions.   Will watch your condition.   Will get help right away if you are not doing well or get worse.  Document Released: 01/01/2005 Document Revised: 03/13/2011 Document Reviewed: 08/06/2006 [facility] Patient Information 2012 [place], [place].

## 2011-08-28 NOTE — ED Provider Notes (Signed)
History     CSN: [id number]  Arrival date & time 08/28/11  1540   First MD Initiated Contact with Patient 08/28/11 1610      Chief Complaint  Patient presents with  . Dizziness  . Weakness    (Consider location/radiation/quality/duration/timing/severity/associated sxs/prior treatment) HPI Comments: [name] is a 78 y.o. Female who awoke this morning with dizziness. The dizziness has persisted. She denies headache, weakness, abdominal pain, chest pain, cough, shortness of breath. She was able to eat both breakfast and lunch today..  She has not had a recent illnesses and is taking her medicines as prescribed.  Patient is a 78 y.o. female presenting with weakness. The history is provided by the patient.  Weakness  Additional symptoms include weakness.    Past Medical History  Diagnosis Date  . Diabetes mellitus   . PE (pulmonary embolism) 2011  . Ovarian cancer   . Diabetes mellitus   . Hyperlipidemia   . Rheumatoid arthritis     Past Surgical History  Procedure Date  . Total hip arthroplasty     Family History  Problem Relation Age of Onset  . Diabetes type II      History  Substance Use Topics  . Smoking status: Former Games developer  . Smokeless tobacco: Former Neurosurgeon  . Alcohol Use: No    OB History    Grav Para Term Preterm Abortions TAB SAB Ect Mult Living                  Review of Systems  Neurological: Positive for weakness.  All other systems reviewed and are negative.    Allergies  Review of patient's allergies indicates no known allergies.  Home Medications   Current Outpatient Rx  Name Route Sig Dispense Refill  . BUSPIRONE HCL 15 MG PO TABS Oral Take 15 mg by mouth 2 (two) times daily.    Marland Kitchen GLIMEPIRIDE 2 MG PO TABS Oral Take 2 mg by mouth daily before breakfast.    . OMEPRAZOLE 20 MG PO CPDR Oral Take 20 mg by mouth every morning.     . OXYBUTYNIN CHLORIDE 5 MG PO TABS Oral Take 5 mg by mouth every morning.     Marland Kitchen SIMVASTATIN 20 MG PO  TABS Oral Take 20 mg by mouth every evening.    . TRAVOPROST (BAK FREE) 0.004 % OP SOLN Both Eyes Place 1 drop into both eyes at bedtime.    Marland Kitchen VITAMIN E 200 UNITS PO CAPS Oral Take 200 Units by mouth every evening.     Marland Kitchen ZINC GLUCONATE 50 MG PO TABS Oral Take 50 mg by mouth every evening.     . CEPHALEXIN 500 MG PO CAPS Oral Take 1 capsule (500 mg total) by mouth 4 (four) times daily. 28 capsule 0    BP 119/51  Pulse 70  Temp(Src) 98.4 F (36.9 C) (Oral)  Resp 23  SpO2 100%  Physical Exam  Nursing note and vitals reviewed. Constitutional: She is oriented to person, place, and time. She appears well-developed and well-nourished.       Frail  HENT:  Head: Normocephalic and atraumatic.  Eyes: Conjunctivae and EOM are normal. Pupils are equal, round, and reactive to light.  Neck: Normal range of motion and phonation normal. Neck supple.  Cardiovascular: Normal rate, regular rhythm and intact distal pulses.   Pulmonary/Chest: Effort normal and breath sounds normal. She exhibits no tenderness.  Abdominal: Soft. She exhibits no distension. There is no tenderness. There is  no guarding.  Musculoskeletal: Normal range of motion.  Neurological: She is alert and oriented to person, place, and time. She has normal strength. She exhibits normal muscle tone.  Skin: Skin is warm and dry.  Psychiatric: She has a normal mood and affect. Her behavior is normal. Judgment and thought content normal.    ED Course  Procedures (including critical care time) ED Treatment: IVF and Rocephin IV  Reevaluation: Pt continues to deny respiratory sx, including cough and SOB.   Date: 08/28/2011  Rate: 62  Rhythm: normal sinus rhythm  QRS Axis: LBBB  Intervals: first degree AV block  ST/T Wave abnormalities: normal  Conduction Disutrbances:first-degree A-V block   Narrative Interpretation:   Old EKG Reviewed: unchanged    Labs Reviewed  CBC - Abnormal; Notable for the following:    Hemoglobin 11.8  (*)    RDW 15.9 (*)    All other components within normal limits  BASIC METABOLIC PANEL - Abnormal; Notable for the following:    Glucose, Bld 194 (*)    GFR calc non Af Amer 51 (*)    GFR calc Af Amer 59 (*)    All other components within normal limits  URINALYSIS, ROUTINE W REFLEX MICROSCOPIC - Abnormal; Notable for the following:    APPearance HAZY (*)    Specific Gravity, Urine 1.004 (*)    Hgb urine dipstick MODERATE (*)    Leukocytes, UA LARGE (*)    All other components within normal limits  GLUCOSE, CAPILLARY - Abnormal; Notable for the following:    Glucose-Capillary 163 (*)    All other components within normal limits  URINE MICROSCOPIC-ADD ON - Abnormal; Notable for the following:    Squamous Epithelial / LPF FEW (*)    All other components within normal limits  DIFFERENTIAL  URINE CULTURE   Dg Chest Port 1 View  08/28/2011  *RADIOLOGY REPORT*  Clinical Data: Cough.  PORTABLE CHEST - 1 VIEW  Comparison: Chest x-ray 06/21/2011.  Findings: Lung volumes are low.  There is a pattern of diffuse interstitial prominence, most severe throughout the left lung, similar to numerous prior examinations.  However, there is partial obscuration of the lateral left hemidiaphragm, suggestive of a small left-sided pleural effusion, and potentially an area of retrocardiac airspace consolidation.  Heart size is borderline enlarged.  No evidence of pulmonary edema. The patient is rotated to the left on today's exam, resulting in distortion of the mediastinal contours and reduced diagnostic sensitivity and specificity for mediastinal pathology.  Atherosclerotic calcifications within the arch of the aorta.  IMPRESSION: 1.  Potential left lower lobe airspace consolidation concerning for pneumonia or aspiration pneumonitis.  There is also likely a small left-sided pleural effusion. These findings could be better evaluated with a standing PA and lateral chest radiograph if clinically indicated. 2.   Parenchymal lung findings suggestive of an underlying interstitial lung disease, involving the left lung to a greater extent than the right. 3.  Atherosclerosis.  Original Report Authenticated By: Florencia Reasons, M.D.     1. UTI (lower urinary tract infection)       MDM  Weakness and malaise d/t UTI. Doubt metabolic instability, serious bacterial infection or impending vascular collapse; the patient is stable for discharge.   Plan: Home Medications- Keflex; Home Treatments- Fluids; Recommended follow up- PCP in 1 week, and prn        Flint Melter, MD 08/29/11 1601

## 2011-08-28 NOTE — ED Notes (Signed)
PT daughter # [phone] . Daughter will call sitter ,who will be at PT's APT .

## 2011-08-28 NOTE — ED Notes (Signed)
Patient has a home health cna,  Since 1500 the patient has had complaints of dizziness and generalized not feeling well.  Patient has 1st degree block on her ekg with elevation in v1.  Patient denies chest pain.  Denies sob.  She complains of increased dizziness with movement.  Patient cbg 163.  Patient denies trauma.  130/62

## 2011-08-31 LAB — URINE CULTURE: Colony Count: 60000

## 2012-06-09 ENCOUNTER — Encounter (HOSPITAL_BASED_OUTPATIENT_CLINIC_OR_DEPARTMENT_OTHER): Payer: PRIVATE HEALTH INSURANCE | Attending: General Surgery

## 2012-06-11 ENCOUNTER — Encounter (HOSPITAL_COMMUNITY): Payer: Self-pay | Admitting: Emergency Medicine

## 2012-06-11 ENCOUNTER — Emergency Department (HOSPITAL_COMMUNITY)
Admission: EM | Admit: 2012-06-11 | Discharge: 2012-06-11 | Disposition: A | Discharge status: Home / Self Care | Payer: PRIVATE HEALTH INSURANCE | Attending: Emergency Medicine | Admitting: Emergency Medicine

## 2012-06-11 ENCOUNTER — Emergency Department (HOSPITAL_COMMUNITY): Payer: PRIVATE HEALTH INSURANCE

## 2012-06-11 DIAGNOSIS — Z87891 Personal history of nicotine dependence: Secondary | ICD-10-CM | POA: Insufficient documentation

## 2012-06-11 DIAGNOSIS — Z86711 Personal history of pulmonary embolism: Secondary | ICD-10-CM | POA: Insufficient documentation

## 2012-06-11 DIAGNOSIS — E785 Hyperlipidemia, unspecified: Secondary | ICD-10-CM | POA: Diagnosis not present

## 2012-06-11 DIAGNOSIS — S81009A Unspecified open wound, unspecified knee, initial encounter: Secondary | ICD-10-CM | POA: Diagnosis not present

## 2012-06-11 DIAGNOSIS — W1809XA Striking against other object with subsequent fall, initial encounter: Secondary | ICD-10-CM | POA: Insufficient documentation

## 2012-06-11 DIAGNOSIS — Z79899 Other long term (current) drug therapy: Secondary | ICD-10-CM | POA: Insufficient documentation

## 2012-06-11 DIAGNOSIS — Z23 Encounter for immunization: Secondary | ICD-10-CM | POA: Insufficient documentation

## 2012-06-11 DIAGNOSIS — Y929 Unspecified place or not applicable: Secondary | ICD-10-CM | POA: Insufficient documentation

## 2012-06-11 DIAGNOSIS — E119 Type 2 diabetes mellitus without complications: Secondary | ICD-10-CM | POA: Insufficient documentation

## 2012-06-11 DIAGNOSIS — Z8739 Personal history of other diseases of the musculoskeletal system and connective tissue: Secondary | ICD-10-CM | POA: Insufficient documentation

## 2012-06-11 DIAGNOSIS — S91009A Unspecified open wound, unspecified ankle, initial encounter: Secondary | ICD-10-CM | POA: Diagnosis not present

## 2012-06-11 DIAGNOSIS — S81812A Laceration without foreign body, left lower leg, initial encounter: Secondary | ICD-10-CM

## 2012-06-11 DIAGNOSIS — Y939 Activity, unspecified: Secondary | ICD-10-CM | POA: Insufficient documentation

## 2012-06-11 DIAGNOSIS — Z8543 Personal history of malignant neoplasm of ovary: Secondary | ICD-10-CM | POA: Insufficient documentation

## 2012-06-11 MED ORDER — TETANUS-DIPHTH-ACELL PERTUSSIS 5-2.5-18.5 LF-MCG/0.5 IM SUSP
0.5000 mL | Freq: Once | INTRAMUSCULAR | Status: AC
  Administered 2012-06-11: 0.5 mL via INTRAMUSCULAR
  Filled 2012-06-11: qty 0.5

## 2012-06-11 MED ORDER — LIDOCAINE-EPINEPHRINE (PF) 2 %-1:200000 IJ SOLN
20.0000 mL | Freq: Once | INTRAMUSCULAR | Status: AC
  Administered 2012-06-11: 20 mL
  Filled 2012-06-11: qty 20

## 2012-06-11 NOTE — ED Notes (Signed)
Per EMS - pt cut her leg while walking to the bed. Pt has bilateral lower extremity swelling. Family reports it was 3 inches long and really deep, wrapped it and said the bleeding was under control. Pt has towels wrapped around right lower leg, blood on sock. ems unable to find distal pulse in bilateral legs. BP 110/74 HR 68.

## 2012-06-11 NOTE — ED Provider Notes (Signed)
Medical screening examination/treatment/procedure(s) were conducted as a shared visit with non-physician practitioner(s) and myself.  I personally evaluated the patient during the encounter   Claudia Octave, MD 06/11/12 2158

## 2012-06-11 NOTE — ED Provider Notes (Signed)
History     CSN: [id number]  Arrival date & time 06/11/12  1658   First MD Initiated Contact with Patient 06/11/12 1713      Chief Complaint  Patient presents with  . Extremity Laceration    (Consider location/radiation/quality/duration/timing/severity/associated sxs/prior treatment) HPI Comments: Patient presents with laceration to right lower leg after hitting it on the bed frame. She denies falling. She denies any loss of consciousness. She denies any weakness, numbness or tingling. Bleeding controlled on arrival with wrapping. She is morbidly obese with bilateral lower extremity swelling. She denies being on anticoagulants.  The history is provided by the patient and the EMS personnel.    Past Medical History  Diagnosis Date  . Diabetes mellitus   . PE (pulmonary embolism) 2011  . Ovarian cancer   . Diabetes mellitus   . Hyperlipidemia   . Rheumatoid arthritis     Past Surgical History  Procedure Laterality Date  . Total hip arthroplasty      Family History  Problem Relation Age of Onset  . Diabetes type II      History  Substance Use Topics  . Smoking status: Former Games developer  . Smokeless tobacco: Former Neurosurgeon  . Alcohol Use: No    OB History   Grav Para Term Preterm Abortions TAB SAB Ect Mult Living                  Review of Systems  Constitutional: Negative for fever, activity change and appetite change.  HENT: Negative for congestion and rhinorrhea.   Respiratory: Negative for cough, chest tightness and shortness of breath.   Cardiovascular: Negative for chest pain.  Gastrointestinal: Negative for nausea, vomiting and abdominal pain.  Genitourinary: Negative for dysuria, vaginal bleeding and vaginal discharge.  Musculoskeletal: Negative for back pain.  Skin: Positive for wound.  Neurological: Negative for dizziness, weakness and headaches.  A complete 10 system review of systems was obtained and all systems are negative except as noted in the HPI and  PMH.    Allergies  Review of patient's allergies indicates no known allergies.  Home Medications   Current Outpatient Rx  Name  Route  Sig  Dispense  Refill  . busPIRone (BUSPAR) 15 MG tablet   Oral   Take 15 mg by mouth 2 (two) times daily.         Marland Kitchen glimepiride (AMARYL) 2 MG tablet   Oral   Take 2 mg by mouth daily before breakfast.         . omeprazole (PRILOSEC) 20 MG capsule   Oral   Take 20 mg by mouth every morning.          Marland Kitchen oxybutynin (DITROPAN) 5 MG tablet   Oral   Take 5 mg by mouth every morning.          . simvastatin (ZOCOR) 20 MG tablet   Oral   Take 20 mg by mouth every evening.         . Travoprost, BAK Free, (TRAVATAN) 0.004 % SOLN ophthalmic solution   Both Eyes   Place 1 drop into both eyes at bedtime.         . vitamin E 200 UNIT capsule   Oral   Take 200 Units by mouth every evening.          . zinc gluconate 50 MG tablet   Oral   Take 50 mg by mouth every evening.  BP 125/54  Pulse 71  Temp(Src) 98 F (36.7 C) (Oral)  Resp 20  SpO2 98%  Physical Exam  Constitutional: She is oriented to person, place, and time. She appears well-developed and well-nourished. No distress.  HENT:  Head: Normocephalic and atraumatic.  Mouth/Throat: Oropharynx is clear and moist. No oropharyngeal exudate.  Eyes: Conjunctivae and EOM are normal. Pupils are equal, round, and reactive to light.  Neck: Normal range of motion.  Cardiovascular: Normal rate, regular rhythm and normal heart sounds.   No murmur heard. Pulmonary/Chest: Effort normal and breath sounds normal. No respiratory distress.  Abdominal: Soft. There is no tenderness. There is no rebound and no guarding.  Musculoskeletal: Normal range of motion. She exhibits no edema and no tenderness.  There is a V-shaped laceration/skin tear to the right lateral lower leg. It is to the subcutaneous fat. Bleeding is controlled. It is about 15 cm in total length.  Neurological:  She is alert and oriented to person, place, and time. No cranial nerve deficit. She exhibits normal muscle tone. Coordination normal.  Skin: Skin is warm.    ED Course  Procedures (including critical care time)  Labs Reviewed - No data to display No results found.   No diagnosis found.    MDM  Mechanical fall with laceration to right lower leg. No loss of consciousness. Vital stable, no distress.   x-ray negative for fracture. Laceration repaired by PA Dahlia Client as his note indicates. Tetanus is up-to-date. Patient will have suture removal in 7-10 days.    Glynn Octave, MD 06/11/12 2006

## 2012-06-11 NOTE — ED Notes (Signed)
Pt reports she was walking when she cut her leg on the bed frame, was able to have family member wrap her leg up but wanted to come get checked up.

## 2012-06-11 NOTE — ED Notes (Signed)
Leg bandaged and d/c instructions reviewed by Dorene Grebe, RN .

## 2012-06-11 NOTE — ED Notes (Signed)
RLE dressing CDI. Pt alert, NAD, calm, interactive, resps e/u, speaking in clear complete sentences. [facility] here. D/c paperwork given. Confirmed family at home to receive pt.

## 2012-06-11 NOTE — ED Notes (Signed)
Bandage applied.

## 2012-06-11 NOTE — ED Provider Notes (Signed)
LACERATION REPAIR Performed by: Roxy Horseman Authorized by: Roxy Horseman Consent: Verbal consent obtained. Risks and benefits: risks, benefits and alternatives were discussed Consent given by: patient Patient identity confirmed: provided demographic data Prepped and Draped in normal sterile fashion Wound explored  Laceration Location: Anterior right leg  Laceration Length: 15cm  No Foreign Bodies seen or palpated  Anesthesia: local infiltration  Local anesthetic: lidocaine 2% without epinephrine  Anesthetic total: 10 ml  Irrigation method: syringe Amount of cleaning: standard  Skin closure: 4-0 prolene  Number of sutures: 11 horizontal mattress, 8 simple interrupted  Technique: mixed as above  Patient tolerance: Patient tolerated the procedure well with no immediate complications.  6:57 PM Tetanus is up to date.   Roxy Horseman, PA-C 06/11/12 1857

## 2012-11-17 ENCOUNTER — Emergency Department (HOSPITAL_COMMUNITY)
Admission: EM | Admit: 2012-11-17 | Discharge: 2012-11-17 | Disposition: A | Discharge status: Home / Self Care | Payer: PRIVATE HEALTH INSURANCE | Attending: Emergency Medicine | Admitting: Emergency Medicine

## 2012-11-17 DIAGNOSIS — Z79899 Other long term (current) drug therapy: Secondary | ICD-10-CM | POA: Insufficient documentation

## 2012-11-17 DIAGNOSIS — N390 Urinary tract infection, site not specified: Secondary | ICD-10-CM

## 2012-11-17 DIAGNOSIS — Z86711 Personal history of pulmonary embolism: Secondary | ICD-10-CM | POA: Insufficient documentation

## 2012-11-17 DIAGNOSIS — E119 Type 2 diabetes mellitus without complications: Secondary | ICD-10-CM | POA: Insufficient documentation

## 2012-11-17 DIAGNOSIS — Z8739 Personal history of other diseases of the musculoskeletal system and connective tissue: Secondary | ICD-10-CM | POA: Insufficient documentation

## 2012-11-17 DIAGNOSIS — E785 Hyperlipidemia, unspecified: Secondary | ICD-10-CM | POA: Insufficient documentation

## 2012-11-17 DIAGNOSIS — Z87891 Personal history of nicotine dependence: Secondary | ICD-10-CM | POA: Insufficient documentation

## 2012-11-17 DIAGNOSIS — Z8543 Personal history of malignant neoplasm of ovary: Secondary | ICD-10-CM | POA: Insufficient documentation

## 2012-11-17 DIAGNOSIS — Z792 Long term (current) use of antibiotics: Secondary | ICD-10-CM | POA: Insufficient documentation

## 2012-11-17 DIAGNOSIS — Z8659 Personal history of other mental and behavioral disorders: Secondary | ICD-10-CM | POA: Insufficient documentation

## 2012-11-17 LAB — URINALYSIS, ROUTINE W REFLEX MICROSCOPIC
Nitrite: POSITIVE — AB
Protein, ur: NEGATIVE mg/dL
Specific Gravity, Urine: 1.015 (ref 1.005–1.030)
Urobilinogen, UA: 0.2 mg/dL (ref 0.0–1.0)

## 2012-11-17 LAB — CBC WITH DIFFERENTIAL/PLATELET
Basophils Absolute: 0 10*3/uL (ref 0.0–0.1)
Basophils Relative: 1 % (ref 0–1)
Eosinophils Absolute: 0.1 10*3/uL (ref 0.0–0.7)
Lymphs Abs: 1.3 10*3/uL (ref 0.7–4.0)
MCH: 26.7 pg (ref 26.0–34.0)
MCHC: 32.1 g/dL (ref 30.0–36.0)
Neutrophils Relative %: 59 % (ref 43–77)
Platelets: 184 10*3/uL (ref 150–400)
RBC: 4.35 MIL/uL (ref 3.87–5.11)
RDW: 15.2 % (ref 11.5–15.5)

## 2012-11-17 LAB — URINE MICROSCOPIC-ADD ON

## 2012-11-17 LAB — BASIC METABOLIC PANEL
GFR calc Af Amer: 89 mL/min — ABNORMAL LOW (ref >90)
GFR calc non Af Amer: 77 mL/min — ABNORMAL LOW (ref >90)
Potassium: 5 mEq/L (ref 3.5–5.1)
Sodium: 137 mEq/L (ref 135–145)

## 2012-11-17 MED ORDER — CEPHALEXIN 500 MG PO CAPS: 500.0000 mg | ORAL_CAPSULE | Freq: Four times a day (QID) | ORAL | Status: DC

## 2012-11-17 NOTE — ED Notes (Signed)
PTAR called  

## 2012-11-17 NOTE — ED Provider Notes (Signed)
.  [facility] Medical screening examination/treatment/procedure(s) were performed by non-physician practitioner and as supervising physician I was immediately available for consultation/collaboration.   Benny Lennert, MD 11/17/12 [phone]

## 2012-11-17 NOTE — Progress Notes (Signed)
[facility] ED Consulted by ED SW about pt disposition and ED RN inquiry about home health services for the pt.  CM reviewed EPIC notes to find that pt not seen by [facility] CM staff since 06/2011.  CM spoke with Baxter Hire of [facility] to see if pt was active for home services Baxter Hire confirmed pt not seen by [facility] staff since 2013.  CM placed call to POA Haywood Pao but informed CM has the incorrect number.  CM spoke with grand daughter Michel Santee who verified POA number with CM.  Cm reviewed CM consult for home health services.  CM informed pt is followed by [facility]. Cm discussed the difference of home health and personal care services.  CM informed by grand daughter Dr Margaretmary Bayley "would not sign the FL2" and is no longer pt's pcp per his certified letter sent to the pt. CM informed grand daughter that a new pcp would be needed to assist with providing a home health agency with possible [facility] RN and [facility]HSW to assist with community placement. CM reviewed how to obtain one via united health care website or customer service line  CM discussed medicare guidelines Grand daughter reports pt has "lots of things wrong with her" and " waiting for tests"  Cm offered to have EDP/PA visit her Cm spoke with EDP/PA, Dahlia Client who reports treatable out patient UTI noted and pt not verbalilizing complaints with orientation to person and place.  Cm to assess pt

## 2012-11-17 NOTE — ED Provider Notes (Signed)
CSN: [id number]     Arrival date & time 11/17/12  1323 History     First MD Initiated Contact with Patient 11/17/12 1511     Chief Complaint  Patient presents with  . Nursing Home Placement    (Consider location/radiation/quality/duration/timing/severity/associated sxs/prior Treatment) The history is provided by the patient and medical records. No language interpreter was used.    [name] is a 79 y.o. female  with a hx of DM, PE, hyperlipidemia, RA, dementia presents to the Emergency Department via EMS without complaint.  Pt states she has 24 hour nursing care at home.  She reports that yesterday she did not like her nurse and requested a new one. This AM no nurse showed up therefore she called her daughter and requested that she contact the home health agency to request another nurse.  Pt reports that daughter then called EMS to have the patient transported to the ED for nursing home placement.  Pt is without somatic complaint.  She reports that she does not ambulate at home and uses a wheelchair.  Pt denies fever, chills, headache, neck pain, chest pain, SOB, abd pain, N/V/D, weakness, dizziness, syncope, dysuria, hematuria.      Past Medical History  Diagnosis Date  . Diabetes mellitus   . PE (pulmonary embolism) 2011  . Ovarian cancer   . Diabetes mellitus   . Hyperlipidemia   . Rheumatoid arthritis    Past Surgical History  Procedure Laterality Date  . Total hip arthroplasty     Family History  Problem Relation Age of Onset  . Diabetes type II     History  Substance Use Topics  . Smoking status: Former Games developer  . Smokeless tobacco: Former Neurosurgeon  . Alcohol Use: No   OB History   Grav Para Term Preterm Abortions TAB SAB Ect Mult Living                 Review of Systems  Constitutional: Negative for fever, diaphoresis, appetite change, fatigue and unexpected weight change.  HENT: Negative for mouth sores and neck stiffness.   Eyes: Negative for visual disturbance.   Respiratory: Negative for cough, chest tightness, shortness of breath and wheezing.   Cardiovascular: Negative for chest pain.  Gastrointestinal: Negative for nausea, vomiting, abdominal pain, diarrhea and constipation.  Endocrine: Negative for polydipsia, polyphagia and polyuria.  Genitourinary: Negative for dysuria, urgency, frequency and hematuria.  Musculoskeletal: Negative for back pain.  Skin: Negative for rash.  Allergic/Immunologic: Negative for immunocompromised state.  Neurological: Negative for syncope, light-headedness and headaches.  Hematological: Does not bruise/bleed easily.  Psychiatric/Behavioral: Negative for sleep disturbance. The patient is not nervous/anxious.     Allergies  Review of patient's allergies indicates no known allergies.  Home Medications   Current Outpatient Rx  Name  Route  Sig  Dispense  Refill  . busPIRone (BUSPAR) 15 MG tablet   Oral   Take 15 mg by mouth every morning.          Marland Kitchen glimepiride (AMARYL) 2 MG tablet   Oral   Take 2 mg by mouth daily before breakfast.         . omeprazole (PRILOSEC) 20 MG capsule   Oral   Take 20 mg by mouth every morning.          . simvastatin (ZOCOR) 20 MG tablet   Oral   Take 20 mg by mouth every evening.         . traMADol (ULTRAM) 50 MG  tablet   Oral   Take 50 mg by mouth every 6 (six) hours as needed for pain.         . Travoprost, BAK Free, (TRAVATAN) 0.004 % SOLN ophthalmic solution   Both Eyes   Place 1 drop into both eyes at bedtime.         . vitamin E 200 UNIT capsule   Oral   Take 200 Units by mouth every evening.          . zinc gluconate 50 MG tablet   Oral   Take 50 mg by mouth every evening.          . cephALEXin (KEFLEX) 500 MG capsule   Oral   Take 1 capsule (500 mg total) by mouth 4 (four) times daily.   40 capsule   0    BP 134/56  Pulse 65  Temp(Src) 98.5 F (36.9 C) (Oral)  Resp 17  SpO2 98% Physical Exam  Nursing note and vitals  reviewed. Constitutional: She appears well-developed and well-nourished. No distress.  Awake, alert, nontoxic appearance  HENT:  Head: Normocephalic and atraumatic.  Right Ear: Hearing, tympanic membrane, external ear and ear canal normal.  Left Ear: Hearing, tympanic membrane, external ear and ear canal normal.  Nose: Nose normal. No mucosal edema.  Mouth/Throat: Uvula is midline, oropharynx is clear and moist and mucous membranes are normal. Mucous membranes are not dry. No oropharyngeal exudate, posterior oropharyngeal edema, posterior oropharyngeal erythema or tonsillar abscesses.  Eyes: Conjunctivae are normal. Pupils are equal, round, and reactive to light. No scleral icterus.  Neck: Normal range of motion. Neck supple.  Cardiovascular: Normal rate, regular rhythm, S1 normal, S2 normal, normal heart sounds and intact distal pulses.   No murmur heard. Pulses:      Radial pulses are 2+ on the right side, and 2+ on the left side.       Dorsalis pedis pulses are 2+ on the right side, and 2+ on the left side.       Posterior tibial pulses are 2+ on the right side, and 2+ on the left side.  Capillary refill < 3 sec  Pulmonary/Chest: Effort normal and breath sounds normal. No respiratory distress. She has no wheezes. She has no rales.  Abdominal: Soft. Normal appearance and bowel sounds are normal. She exhibits no distension and no mass. There is no tenderness. There is no rebound, no guarding and no CVA tenderness.  Musculoskeletal: Normal range of motion. She exhibits no edema and no tenderness.  Lymphadenopathy:    She has no cervical adenopathy.  Neurological: She is alert. She exhibits normal muscle tone. Coordination normal. GCS eye subscore is 4. GCS verbal subscore is 5. GCS motor subscore is 6.  Speech is clear and goal oriented Moves extremities without ataxia Pt is alert to person and place.  She is unable to orient to time reporting that it is 1907 and that Laural Benes is president.   She is also able to relay correctly the events of the last several days including how she found herself in the ER.    Skin: Skin is warm and dry. She is not diaphoretic. No erythema.  Psychiatric: She has a normal mood and affect.    ED Course   Procedures (including critical care time)  Labs Reviewed  CBC WITH DIFFERENTIAL - Abnormal; Notable for the following:    Hemoglobin 11.6 (*)    All other components within normal limits  BASIC METABOLIC PANEL - Abnormal;  Notable for the following:    Glucose, Bld 106 (*)    GFR calc non Af Amer 77 (*)    GFR calc Af Amer 89 (*)    All other components within normal limits  URINALYSIS, ROUTINE W REFLEX MICROSCOPIC - Abnormal; Notable for the following:    APPearance CLOUDY (*)    Nitrite POSITIVE (*)    Leukocytes, UA LARGE (*)    All other components within normal limits  URINE MICROSCOPIC-ADD ON - Abnormal; Notable for the following:    Bacteria, UA FEW (*)    All other components within normal limits  URINE CULTURE   No results found. 1. UTI (lower urinary tract infection)     MDM  Guinevere Ferrari presents via EMS for nursing home placement.  She is without complaints and resting comfortably, alert, oriented x2, nontoxic, nonseptic appearing.    Discussed with Dr Margaretmary Bayley. He reports that she was discharged from the practice 2 weeks ago and he does not know who the home health agency is.    Discussed with Social Work and Case Management who reports that pt needs a PCP for placement and they will assist with this.    5:44 PM Granddaughter at bedside reports no changes in patients mental status, but expresses concerns that since the home help quit they have no one to care for her grandmother.  I explained that the patient does not meet admission criteria and that we cannot admit to a facility from the ED.  Granddaughter is frustrated but understanding of this.    UA with evidence of UTI, but no flank pain , CVA tenderness, fever,  tachycardia or leukocytosis to indicate urosepsis or pyelonephritis.  No change from pts baseline mental status.  Pt will be d/c home with family who reports they will be able to be with her tonight.  Orders placed for Home Health and Social Work.  Pt and granddaughter have been given a list of Home Health agencies who can assist with care during the day.  Pt reports that she is ready to go home.  She will be d/c with PO antibiotic and a urine culture has been sent.    At this time there does not appear to be any evidence of an acute emergency medical condition and the patient appears stable for discharge with appropriate outpatient follow up.  Diagnosis was discussed with patient and granddaughter who verbalizes understanding and is agreeable to discharge.   Dr. Estell Harpin was consulted and agrees with the plan.     Dahlia Client Arti Trang, PA-C 11/17/12 1746

## 2012-11-17 NOTE — Progress Notes (Addendum)
[facility] ED CM ans ED SW spoke with pt initially by herself awaiting return of grand daughter Michel Santee Cm asked pt where she was and was informed "the hospital", what day is it, "wednesday" and why here Pt reports her home nurse (personal care services (PCS)) "was suppose to be there at 10 am but did not arrive" and "because of that I'm in the emergency room".  When asked if she would like to go to nursing home or her home, she informed CM she wanted to "go to my home" Pt continued to tell Cm she has a manual and electric wheel chair at home. Reports not being able to use her electric wheel chair because of a missing electrical cord.  Pt stated she had not been walking in "six months" Pt remember having home services "in the past" Pt states she has "all the equipment I need at home" Pt confirms Dr Chestine Spore was her doctor but is no longer Pt said her daughter informed her of this Cm inquired about a certified letter from pcp and pt states "he may have sent it to my daughter but I did not see it", "she is my POA"   Pt reports when she has assist of PCS she likes to get in her wheel chair and go to the store Pt informed CM her daughter manages a group home and "don't have time for me" CM inquired about other support systems and she reports her grand daughter helps but has to work. Pt informed CM and SW that the grand daughter was "heavy set", wearing brown blouse and pants and was with a "tall man with a plaid shirt"  Grand daughter and her "fiance" returned to the room Crowder daughter had on a brown sweater and brown pants and the female had on a plaid shirt. CM reviewed CM consult to assist with community resources for home discharge [place] daughter called and placed POS, Haywood Pao on speaker phone. CM reviewed cm consult for community resources for discharge home. POA began speaking She stated the pt was "not returning home" She informed CM and SW "you will keep her there" and "Donnika you will not take her home". POA  began to discuss her concerns with Dr Chestine Spore, APS reports, "she can't take care of herself" CM reviewed medicare guidelines with POA, availability of other private duty nursing agencies other than [facility]. POA stated "we are not paying for private duty nursing services and she can't either", " we are not doing it anymore", " I am calling the police"  CM discussed pt's right to be discharged where she chooses. CM inquired of pt during this interaction where she wanted to go and she again voiced "I want to go home if I can get a nurse or someone to be with me."  Granddaughter began showing sheets of paper to ED SW related pcs, APS. POA disconnected the line  Donnika given [place] [facility] agency list and private duty nursing list CM offered Florentina Jenny contact information for MD home visits but she states her mother, POA had already faxed information to Dr Redmond School without a response.  The Female agreed to stay with the pt for the night.  CM encouraged Donnika to call CM when a Pcp is found and CM would assist with home health services Petition for Guardianship discussed with Donnika. CM business card provided to [facility]  ED PA updated at 1740 and orders requested for home services pending return call from grand daughter

## 2012-11-17 NOTE — Progress Notes (Signed)
CSW responded to consult from EDPA concerning pts family wanting pt placed in a nursing facility.  Per EDPA pt medically cleared and was oriented and had home health nursing services in place.  EDPA requested CSW follow up to see what kind of services were in place.  CSW consulted with [facility] concerning pts home health services.  EDCM reported that pt had not been seen by [facility] CM since 06/2011 and confirmed that pt was not active with [facility].  CM placed a call to [phone] to consult with pts daughter and Avon Gully, Haywood Pao with CSW concerning pts current services.  Person answering the phone reported that it was the wrong number.  EDCM called  pts grand daughter who was at pts bedside  verified the number.    CSW and [facility] met with pt at her bedside.  Her granddaughter had stepped out.  Pt was alert and friendly.  Pt was oriented to person, place, time, and situation.  Pt reported that she had a nurse who usually comes in at 10 am to help her with her ADLs and when she had not shown up by 11, "I called my daughter and because of that I'm in the emergency room".  Pt reported that she did not want to go a nursing facility but confirmed that she did live alone,  " I want to go home and I will be all right if I have someone there to help me".    Pt reported that she had a manual and electric wheelchair at home but had not been able to use her electric wheel chair because "all of my extension cords are missing".  Pt reported that she enjoyed getting in her electric wheelchair when she had some assistance and "ride down the street to the store".  Pt reported that her son in law bought all of her foods and that the home health caregiver prepared meals for her.  Pt was able to confirm that Dr. Chestine Spore was no longer her doctor and that she had not seen him in several months.  Pt reported that she had not received anything from Dr. Chestine Spore stating that he could no longer be her Dr. But that  Her daughter told informed her of  this.  EDCM discussed with pt that she needed to secure a new primary doctor.    Pt was able to adequately describe her granddaughter Michel Santee as "heavy set wearing brown pants and brown shirt" and her fiancee as "tall man with a plaid shirt".  She as CSW to look and see if they were in the hallway because they could give more information.   Granddaughter and fiancee returned to the room.  CM discussed community resources for discharge home.  Granddaugher called her mother, Haywood Pao, and placed her on speaker phone.    Ms. Cletus Gash stated that pt "was not coming home".  She informed EDCM and CSW "you will keep her there".  CSW attempted to explain to [facility] that pt was medically stable and appeared competent and that she had no reason to be kept in the ED and that we could not place her from the ED, but with services in place that pt could be placed in a nursing facility from home if that was decided.  HCPOA stated, "you can place her from the hospital!"  "she can't take care of herself"  "She is not competent!" "we are not paying for private duty nursing services and she can't either".  "We are not  doing it anymore" " I'm calling the police".  "Do I have to call channel [employer] news too?"   HCPOA  Reported that APS was already involved due to her concerns about the pts former doctor and gave the name of Darryl Valentina Lucks as APS worker.   HCPOA told her mother, "you know you can't take care of yourself"  And informed her daughter, Michel Santee, you will not take her home".  When CSW discussed with HCPOA that APS would have to contacted again if pt was not allowed to come home,  HCPOA disconnected the call.  CSW spoke with pt and granddaughter after [facility] left to get referral information for family.  Jethro Poling, pts granddaughter's fiancee stated, "I will stay with her tonight if no one else can.  There is no need to call APS, just give [place] the information we need for services, and we will work this out".  Pts  granddaughter, Comer Locket, reported that she would follow up with pts insurance to get information about another primary care doctor.   CSW gave family information concerning local nursing home resources.  Guardianship was discussed with granddaughter if there were still concerns about pts competency. EDCM gave granddaughter resources and encouraged her to follow up with [facility] once a primary care dr was secured for assistance with services.  Granddaughter and fiancee left to prepare for pts return home.  Marva Panda, Theresia Majors  [phone]  .11/17/2012 7:13 pm

## 2012-11-17 NOTE — ED Notes (Signed)
Per EMS: Pt from home for nursing home placement.  Hx of dementia.  Family can't take care of her and the caregiver quit.  Have been trying to get into a nursing home since May but her PCP will not sign FL2.  Pt's family reported Dr to the board and now doctor is refusing to refill her medications.

## 2012-11-20 LAB — URINE CULTURE

## 2012-11-21 ENCOUNTER — Telehealth (HOSPITAL_COMMUNITY): Payer: Self-pay | Admitting: Emergency Medicine

## 2012-11-21 NOTE — Progress Notes (Signed)
ED Antimicrobial Stewardship Positive Culture Follow Up   [name] is an 79 y.o. female who presented to [facility] on 11/17/2012 with a chief complaint of  Chief Complaint  Patient presents with  . Nursing Home Placement   (found to have +UA)  Recent Results (from the past 720 hour(s))  URINE CULTURE     Status: None   Collection Time    11/17/12  2:41 PM      Result Value Range Status   Specimen Description URINE, RANDOM   Final   Special Requests NONE   Final   Culture  Setup Time     Final   Value: 11/17/2012 21:06     Performed at [facility] Count     Final   Value: >=100,000 COLONIES/ML     Performed at [facility]   Culture     Final   Value: ESCHERICHIA COLI     MORGANELLA MORGANII     Performed at [facility]   Report Status 11/20/2012 FINAL   Final   Organism ID, Bacteria ESCHERICHIA COLI   Final   Organism ID, Bacteria MORGANELLA MORGANII   Final    [x]  Treated with Keflex, organism resistant to prescribed antimicrobial []  Patient discharged originally without antimicrobial agent and treatment is now indicated  New antibiotic prescription: Bactrim 1DS tablet PO twice daily for 7 days  ED Provider: Rhea Bleacher, PA-C  [name] 11/21/2012, 11:57 AM Infectious Diseases Pharmacist Phone# [phone]

## 2012-11-21 NOTE — ED Notes (Signed)
Post ED Visit - Positive Culture Follow-up: Successful Patient Follow-Up  Culture assessed and recommendations reviewed by: []  Wes Dulaney, Pharm.D., BCPS []  Celedonio Miyamoto, Pharm.D., BCPS []  Georgina Pillion, Pharm.D., BCPS []  [place], [place].D., BCPS, AAHIVP []  Estella Husk, Pharm.D., BCPS, AAHIVP [x]  [name], Pharm.D.  Positive urine culture  []  Patient discharged without antimicrobial prescription and treatment is now indicated [x]  Organism is resistant to prescribed ED discharge antimicrobial []  Patient with positive blood cultures  Changes discussed with ED provider: Rhea Bleacher PA-C New antibiotic prescription: Bactrim DS 1 tab PO BID x 7 days    Kylie A Holland 11/21/2012, 1:00 PM

## 2013-05-16 ENCOUNTER — Non-Acute Institutional Stay (SKILLED_NURSING_FACILITY): Payer: PRIVATE HEALTH INSURANCE | Admitting: Internal Medicine

## 2013-05-16 ENCOUNTER — Encounter: Payer: Self-pay | Admitting: Internal Medicine

## 2013-05-16 ENCOUNTER — Other Ambulatory Visit: Payer: Self-pay | Admitting: *Deleted

## 2013-05-16 DIAGNOSIS — E785 Hyperlipidemia, unspecified: Secondary | ICD-10-CM

## 2013-05-16 DIAGNOSIS — M069 Rheumatoid arthritis, unspecified: Secondary | ICD-10-CM

## 2013-05-16 DIAGNOSIS — K219 Gastro-esophageal reflux disease without esophagitis: Secondary | ICD-10-CM | POA: Insufficient documentation

## 2013-05-16 DIAGNOSIS — F411 Generalized anxiety disorder: Secondary | ICD-10-CM

## 2013-05-16 DIAGNOSIS — F419 Anxiety disorder, unspecified: Secondary | ICD-10-CM | POA: Insufficient documentation

## 2013-05-16 DIAGNOSIS — E1149 Type 2 diabetes mellitus with other diabetic neurological complication: Secondary | ICD-10-CM | POA: Insufficient documentation

## 2013-05-16 DIAGNOSIS — Z993 Dependence on wheelchair: Secondary | ICD-10-CM

## 2013-05-16 DIAGNOSIS — E669 Obesity, unspecified: Secondary | ICD-10-CM | POA: Insufficient documentation

## 2013-05-16 DIAGNOSIS — I10 Essential (primary) hypertension: Secondary | ICD-10-CM

## 2013-05-16 NOTE — Assessment & Plan Note (Signed)
Well balanced appropriate diet

## 2013-05-16 NOTE — Assessment & Plan Note (Signed)
Continue simvastatin; have no baseline for herso oerder FLP and BMP

## 2013-05-16 NOTE — Assessment & Plan Note (Signed)
Contnue amaryl 2 mg daily; will order Hba1c to see where we are;no records

## 2013-05-16 NOTE — Assessment & Plan Note (Signed)
On  No RA specific drugs; continue tramadol for pain

## 2013-05-16 NOTE — Assessment & Plan Note (Signed)
Continue buspar

## 2013-05-16 NOTE — Progress Notes (Signed)
MRN: [id number] Name: [name]  Sex: female Age: 80 y.o. DOB: [birth year]  [place] #: [name] Facility/Room: 101B Level Of Care: SNF Provider: Inocencio Homes D Emergency Contacts: Extended Emergency Contact Information Primary Emergency Contact: [name], [place] Phone: [phone] Work Phone: [phone] Relation: Daughter Secondary Emergency Contact: [name] Phone: [phone] Relation: Grandaughter  Code Status: FULL  Allergies: Review of patient's allergies indicates no known allergies.  Chief Complaint  Patient presents with  . nursing home admission    HPI: Patient is 80 y.o. female who is WC bound who is admitted to residential status.  Past Medical History  Diagnosis Date  . PE (pulmonary embolism) 2011  . Ovarian cancer   . Hyperlipidemia   . Rheumatoid arthritis(714.0)   . GERD (gastroesophageal reflux disease)   . Hypertension   . Anxiety   . Diabetes mellitus   . Diabetes mellitus     Past Surgical History  Procedure Laterality Date  . Total hip arthroplasty        Medication List       This list is accurate as of: 05/16/13 11:38 PM.  Always use your most recent med list.               busPIRone 15 MG tablet  Commonly known as:  BUSPAR  Take 15 mg by mouth 2 (two) times daily.     glimepiride 2 MG tablet  Commonly known as:  AMARYL  Take 2 mg by mouth daily before breakfast.     nystatin cream  Commonly known as:  MYCOSTATIN  Apply 1 application topically 2 (two) times daily.     nystatin powder  Commonly known as:  MYCOSTATIN  Apply topically 2 (two) times daily.     omeprazole 20 MG capsule  Commonly known as:  PRILOSEC  Take 20 mg by mouth every morning.     simvastatin 20 MG tablet  Commonly known as:  ZOCOR  Take 20 mg by mouth every evening.     traMADol 50 MG tablet  Commonly known as:  ULTRAM  Take 50 mg by mouth every 6  (six) hours as needed for pain.     Travoprost (BAK Free) 0.004 % Soln ophthalmic solution  Commonly known as:  TRAVATAN  Place 1 drop into both eyes at bedtime.     vitamin E 200 UNIT capsule  Take 200 Units by mouth every evening.     zinc gluconate 50 MG tablet  Take 50 mg by mouth every evening.        Meds ordered this encounter  Medications  . nystatin cream (MYCOSTATIN)    Sig: Apply 1 application topically 2 (two) times daily.  Marland Kitchen nystatin (MYCOSTATIN) powder    Sig: Apply topically 2 (two) times daily.    Immunization History  Administered Date(s) Administered  . Tdap 07/01/2011, 06/11/2012    History  Substance Use Topics  . Smoking status: Former Research scientist (life sciences)  . Smokeless tobacco: Former Systems developer  . Alcohol Use: No    Family history is noncontributory    Review of Systems  DATA OBTAINED: from patient; no c/o except wants to leave to go home GENERAL: Feels well no fevers, fatigue, appetite changes SKIN: No itching, rash or wounds EYES: No eye pain, redness, discharge EARS: No earache, tinnitus, change in hearing NOSE: No congestion, drainage or bleeding  MOUTH/THROAT: No mouth or tooth  pain, No sore throat, No difficulty chewing or swallowing  RESPIRATORY: No cough, wheezing, SOB CARDIAC: No chest pain, palpitations, lower extremity edema  GI: No abdominal pain, No N/V/D or constipation, No heartburn or reflux  GU: No dysuria, frequency or urgency, or incontinence  MUSCULOSKELETAL: No unrelieved bone/joint pain NEUROLOGIC: No headache, dizziness or focal weakness PSYCHIATRIC: No overt anxiety or sadness. Sleeps well. No behavior issue.   Filed Vitals:   05/16/13 1505  BP: 128/74  Pulse: 76  Temp: 97.2 F (36.2 C)  Resp: 18    Physical Exam  GENERAL APPEARANCE: Alert, conversant. Appropriately groomed. No acute distress.  SKIN: No diaphoresis rash HEAD: Normocephalic, atraumatic  EYES: Conjunctiva/lids clear. Pupils round, reactive. EOMs intact.   EARS: External exam WNL, canals clear. Hearing grossly normal.  NOSE: No deformity or discharge.  MOUTH/THROAT: Lips w/o lesions  RESPIRATORY: Breathing is even, unlabored. Lung sounds are clear   CARDIOVASCULAR: Heart RRR no murmurs, rubs or gallops. No peripheral edema.  GASTROINTESTINAL: Abdomen is soft, non-tender, not distended w/ normal bowel sounds GENITOURINARY: Bladder non tender, not distended  MUSCULOSKELETAL: No abnormal joints c/w RA NEUROLOGIC: Oriented X2. Cranial nerves 2-12 grossly intact. Moves all extremities no tremor. PSYCHIATRIC: undeclared dementia probable, no behavioral issues  Patient Active Problem List   Diagnosis Date Noted  . Obesity, unspecified 05/16/2013  . GERD (gastroesophageal reflux disease)   . Hypertension   . Anxiety   . DM (diabetes mellitus), type 2 with neurological complications   . Constipation 06/23/2011  . Urinary tract infection 06/21/2011  . Hypoglycemia 06/21/2011  . Hyperlipidemia 06/21/2011  . Rheumatoid arthritis 06/21/2011  . Hx of ovarian cancer 06/21/2011  . Hx pulmonary embolism 06/21/2011  . Fever 06/21/2011  . Wheelchair bound 06/21/2011    CBC    Component Value Date/Time   WBC 4.2 11/17/2012 1440   RBC 4.35 11/17/2012 1440   HGB 11.6* 11/17/2012 1440   HCT 36.1 11/17/2012 1440   PLT 184 11/17/2012 1440   MCV 83.0 11/17/2012 1440   LYMPHSABS 1.3 11/17/2012 1440   MONOABS 0.2 11/17/2012 1440   EOSABS 0.1 11/17/2012 1440   BASOSABS 0.0 11/17/2012 1440    CMP     Component Value Date/Time   NA 137 11/17/2012 1440   K 5.0 11/17/2012 1440   CL 105 11/17/2012 1440   CO2 26 11/17/2012 1440   GLUCOSE 106* 11/17/2012 1440   BUN 10 11/17/2012 1440   CREATININE 0.66 11/17/2012 1440   CALCIUM 8.7 11/17/2012 1440   PROT 7.6 06/21/2011 0135   ALBUMIN 2.3* 06/21/2011 0135   AST 21 06/21/2011 0135   ALT 12 06/21/2011 0135   ALKPHOS 101 06/21/2011 0135   BILITOT 0.3 06/21/2011 0135   GFRNONAA 77* 11/17/2012 1440   GFRAA 89* 11/17/2012  1440    Assessment and Plan  Hypertension Controlled on no meds  GERD (gastroesophageal reflux disease) Continue omeprazole  Rheumatoid arthritis On  No RA specific drugs; continue tramadol for pain  Hyperlipidemia Continue simvastatin; have no baseline for herso oerder FLP and BMP  Anxiety Continue buspar  DM (diabetes mellitus), type 2 with neurological complications Contnue amaryl 2 mg daily; will order Hba1c to see where we are;no records  Wheelchair bound OT/PT  Obesity, unspecified Well balanced appropriate diet    [name], MD

## 2013-05-16 NOTE — Assessment & Plan Note (Signed)
OT/PT

## 2013-05-16 NOTE — Assessment & Plan Note (Signed)
Continue omeprazole 

## 2013-05-16 NOTE — Assessment & Plan Note (Signed)
Controlled on no meds

## 2013-05-20 ENCOUNTER — Other Ambulatory Visit: Payer: Self-pay | Admitting: *Deleted

## 2013-05-20 MED ORDER — TRAMADOL HCL 50 MG PO TABS: 50.0000 mg | ORAL_TABLET | Freq: Four times a day (QID) | ORAL | Status: DC | PRN

## 2013-05-20 NOTE — Telephone Encounter (Signed)
[facility] 

## 2013-06-14 ENCOUNTER — Non-Acute Institutional Stay (SKILLED_NURSING_FACILITY): Payer: PRIVATE HEALTH INSURANCE | Admitting: Nurse Practitioner

## 2013-06-14 ENCOUNTER — Encounter: Payer: Self-pay | Admitting: Nurse Practitioner

## 2013-06-14 DIAGNOSIS — R413 Other amnesia: Secondary | ICD-10-CM

## 2013-06-14 DIAGNOSIS — F411 Generalized anxiety disorder: Secondary | ICD-10-CM

## 2013-06-14 DIAGNOSIS — F419 Anxiety disorder, unspecified: Secondary | ICD-10-CM

## 2013-06-14 DIAGNOSIS — M069 Rheumatoid arthritis, unspecified: Secondary | ICD-10-CM

## 2013-06-14 DIAGNOSIS — E785 Hyperlipidemia, unspecified: Secondary | ICD-10-CM

## 2013-06-14 DIAGNOSIS — K219 Gastro-esophageal reflux disease without esophagitis: Secondary | ICD-10-CM

## 2013-06-14 DIAGNOSIS — I10 Essential (primary) hypertension: Secondary | ICD-10-CM

## 2013-06-14 DIAGNOSIS — E1149 Type 2 diabetes mellitus with other diabetic neurological complication: Secondary | ICD-10-CM

## 2013-06-14 DIAGNOSIS — K5900 Constipation, unspecified: Secondary | ICD-10-CM

## 2013-06-14 NOTE — Progress Notes (Signed)
Patient ID: [name], female   DOB: 11-07-1923, 80 y.o.   MRN: [id number]    Nursing Home Location:  [place] of Service: SNF (31)  PCP: [name], MD  No Known Allergies  Chief Complaint  Patient presents with  . Medical Managment of Chronic Issues    HPI:  80 year old female who is WC bound with PMH of memory loss, HTN, Hyperlipidemia, GERD, DM, constipation who is now a LTR of [facility] and is being seen today for routine follow up on chronic conditions; staff without any concerns at this time   Review of Systems:  Review of Systems  Constitutional: Negative for fever, chills and malaise/fatigue.  Respiratory: Negative for cough.   Cardiovascular: Negative for chest pain.  Gastrointestinal: Negative for heartburn, abdominal pain and constipation.  Genitourinary: Negative for dysuria.  Musculoskeletal: Negative for joint pain and myalgias.  Skin: Negative.   Neurological: Negative for dizziness, weakness and headaches.  Psychiatric/Behavioral: Positive for memory loss. Negative for depression. The patient is not nervous/anxious.      Past Medical History  Diagnosis Date  . PE (pulmonary embolism) 2011  . Ovarian cancer   . Hyperlipidemia   . Rheumatoid arthritis(714.0)   . GERD (gastroesophageal reflux disease)   . Hypertension   . Anxiety   . Diabetes mellitus   . Diabetes mellitus   . Hx of ovarian cancer 06/21/2011   Past Surgical History  Procedure Laterality Date  . Total hip arthroplasty     Social History:   reports that she has quit smoking. She has quit using smokeless tobacco. She reports that she does not drink alcohol or use illicit drugs.  Family History  Problem Relation Age of Onset  . Diabetes type II      Medications: Patient's Medications  New Prescriptions   No medications on file  Previous Medications   BUSPIRONE (BUSPAR) 15 MG TABLET    Take 15 mg by mouth 2 (two) times daily.    GLIMEPIRIDE (AMARYL) 2 MG TABLET    Take 2 mg by mouth daily before breakfast.   NYSTATIN (MYCOSTATIN) POWDER    Apply topically 2 (two) times daily.   NYSTATIN CREAM (MYCOSTATIN)    Apply 1 application topically 2 (two) times daily.   OMEPRAZOLE (PRILOSEC) 20 MG CAPSULE    Take 20 mg by mouth every morning.    SIMVASTATIN (ZOCOR) 20 MG TABLET    Take 20 mg by mouth every evening.   TRAMADOL (ULTRAM) 50 MG TABLET    Take 1 tablet (50 mg total) by mouth every 6 (six) hours as needed.   TRAVOPROST, BAK FREE, (TRAVATAN) 0.004 % SOLN OPHTHALMIC SOLUTION    Place 1 drop into both eyes at bedtime.   VITAMIN E 200 UNIT CAPSULE    Take 200 Units by mouth every evening.    ZINC GLUCONATE 50 MG TABLET    Take 50 mg by mouth every evening.   Modified Medications   No medications on file  Discontinued Medications   No medications on file     Physical Exam:  Filed Vitals:   06/14/13 1330  BP: 111/65  Pulse: 74  Temp: 97.1 F (36.2 C)  Resp: 20  Weight: 181 lb (82.101 kg)   Physical Exam  Constitutional: She is well-developed, well-nourished, and in no distress.  HENT:  Head: Normocephalic and atraumatic.  Mouth/Throat: Oropharynx is clear and moist. No oropharyngeal exudate.  Eyes: Conjunctivae  and EOM are normal. Pupils are equal, round, and reactive to light.  Neck: Normal range of motion. Neck supple.  Cardiovascular: Normal rate, regular rhythm and normal heart sounds.   Pulmonary/Chest: Effort normal and breath sounds normal. No respiratory distress.  Abdominal: Soft. Bowel sounds are normal. She exhibits no distension.  Musculoskeletal: She exhibits no edema and no tenderness.  Neurological: She is alert.  Oriented to self only   Skin: Skin is warm and dry.  Psychiatric: Affect normal.      Labs reviewed: Basic Metabolic Panel:  Recent Labs  11/17/12 1440  NA 137  K 5.0  CL 105  CO2 26  GLUCOSE 106*  BUN 10  CREATININE 0.66  CALCIUM 8.7   Liver Function  Tests: No results found for this basename: AST, ALT, ALKPHOS, BILITOT, PROT, ALBUMIN,  in the last 8760 hours No results found for this basename: LIPASE, AMYLASE,  in the last 8760 hours No results found for this basename: AMMONIA,  in the last 8760 hours CBC:  Recent Labs  11/17/12 1440  WBC 4.2  NEUTROABS 2.5  HGB 11.6*  HCT 36.1  MCV 83.0  PLT 184    Assessment/Plan 1. Hypertension -not on any hypertensive medications, blood pressure at goal   2. GERD (gastroesophageal reflux disease) -stable on omeprazole 20 mg daily   3. DM (diabetes mellitus), type 2 with neurological complications cbgs in am rn from 60-165 fasting; will stop amaryl due to risk of hypoglycemia and cont to monitor -will get A1C at this time  4. Hyperlipidemia -on zocor; will follow up fasting lipids  5. Anxiety -currently control on buspar  6. Rheumatoid arthritis - on no RA medications; does not complain of pain, has ultram PRN   7. Constipation -no complaints of constipation at this time, has facility protocol if needed   8. Memory loss Will get TSH, CBC, CMP at this time  -cognitive assessment shows moderate memory loss

## 2013-06-22 ENCOUNTER — Encounter: Payer: Self-pay | Admitting: Internal Medicine

## 2013-06-22 ENCOUNTER — Non-Acute Institutional Stay (SKILLED_NURSING_FACILITY): Payer: PRIVATE HEALTH INSURANCE | Admitting: Internal Medicine

## 2013-06-22 DIAGNOSIS — B372 Candidiasis of skin and nail: Secondary | ICD-10-CM

## 2013-06-22 NOTE — Progress Notes (Signed)
MRN: [id number] Name: [name]  Sex: female Age: 80 y.o. DOB: 04-07-1924  [place] #: [name] Facility/Room: 101B Level Of Care: SNF Provider: Inocencio Homes D Emergency Contacts: Extended Emergency Contact Information Primary Emergency Contact: [name], [name] of [place] Phone: [phone] Work Phone: [phone] Relation: Daughter Secondary Emergency Contact: [name] [place] of [place] Phone: [phone] Relation: Grandaughter  Code Status: FULL  Allergies: Review of patient's allergies indicates no known allergies.  Chief Complaint  Patient presents with  . Acute Visit    HPI: Patient is 80 y.o. female who the nurses asked me to see because she had some redness in the skin fold under her R breast.  Past Medical History  Diagnosis Date  . PE (pulmonary embolism) 2011  . Ovarian cancer   . Hyperlipidemia   . Rheumatoid arthritis(714.0)   . GERD (gastroesophageal reflux disease)   . Hypertension   . Anxiety   . Diabetes mellitus   . Diabetes mellitus   . Hx of ovarian cancer 06/21/2011    Past Surgical History  Procedure Laterality Date  . Total hip arthroplasty        Medication List       This list is accurate as of: 06/22/13  2:18 PM.  Always use your most recent med list.               busPIRone 15 MG tablet  Commonly known as:  BUSPAR  Take 15 mg by mouth 2 (two) times daily.     glimepiride 2 MG tablet  Commonly known as:  AMARYL  Take 2 mg by mouth daily before breakfast.     nystatin cream  Commonly known as:  MYCOSTATIN  Apply 1 application topically 2 (two) times daily.     nystatin powder  Commonly known as:  MYCOSTATIN  Apply topically 2 (two) times daily.     omeprazole 20 MG capsule  Commonly known as:  PRILOSEC  Take 20 mg by mouth every morning.     simvastatin 20 MG tablet  Commonly known as:  ZOCOR  Take 20 mg by mouth every evening.     traMADol 50 MG  tablet  Commonly known as:  ULTRAM  Take 1 tablet (50 mg total) by mouth every 6 (six) hours as needed.     Travoprost (BAK Free) 0.004 % Soln ophthalmic solution  Commonly known as:  TRAVATAN  Place 1 drop into both eyes at bedtime.     vitamin E 200 UNIT capsule  Take 200 Units by mouth every evening.     zinc gluconate 50 MG tablet  Take 50 mg by mouth every evening.        No orders of the defined types were placed in this encounter.    Immunization History  Administered Date(s) Administered  . Tdap 07/01/2011, 06/11/2012    History  Substance Use Topics  . Smoking status: Former Research scientist (life sciences)  . Smokeless tobacco: Former Systems developer  . Alcohol Use: No    Review of Systems  DATA OBTAINED: from patient GENERAL: Feels well no fevers, fatigue, appetite changes SKIN: No itching, rash is better; admits powder has been used there recently HEENT: No complaint RESPIRATORY: No cough, wheezing, SOB CARDIAC: No chest pain, palpitations, lower extremity edema  GI: No abdominal pain, No N/V/D or constipation, No heartburn or reflux  GU: No dysuria, frequency or urgency, or incontinence  MUSCULOSKELETAL: No unrelieved bone/joint  pain NEUROLOGIC: No headache, dizziness or focal weakness PSYCHIATRIC: No overt anxiety or sadness. Sleeps well.   Filed Vitals:   06/22/13 1417  BP: 113/65  Pulse: 75  Temp: 99 F (37.2 C)  Resp: 20    Physical Exam  GENERAL APPEARANCE: Alert, conversant. Appropriately groomed. No acute distress  SKIN: No redness , heat or breakdown under R breast HEENT: Unremarkable RESPIRATORY: Breathing is even, unlabored. Lung sounds are clear   CARDIOVASCULAR: Heart RRR no murmurs, rubs or gallops. No peripheral edema  GASTROINTESTINAL: Abdomen is soft, non-tender, not distended w/ normal bowel sounds.  GENITOURINARY: Bladder non tender, not distended  MUSCULOSKELETAL: No abnormal joints or musculature NEUROLOGIC: Cranial nerves 2-12 grossly intact. Moves all  extremities no tremor. PSYCHIATRIC: Mood and affect appropriate to situation, no behavioral issues  Patient Active Problem List   Diagnosis Date Noted  . Obesity, unspecified 05/16/2013  . GERD (gastroesophageal reflux disease)   . Hypertension   . Anxiety   . DM (diabetes mellitus), type 2 with neurological complications   . Constipation 06/23/2011  . Hyperlipidemia 06/21/2011  . Rheumatoid arthritis 06/21/2011  . Hx pulmonary embolism 06/21/2011  . Wheelchair bound 06/21/2011    CBC    Component Value Date/Time   WBC 4.2 11/17/2012 1440   RBC 4.35 11/17/2012 1440   HGB 11.6* 11/17/2012 1440   HCT 36.1 11/17/2012 1440   PLT 184 11/17/2012 1440   MCV 83.0 11/17/2012 1440   LYMPHSABS 1.3 11/17/2012 1440   MONOABS 0.2 11/17/2012 1440   EOSABS 0.1 11/17/2012 1440   BASOSABS 0.0 11/17/2012 1440    CMP     Component Value Date/Time   NA 137 11/17/2012 1440   K 5.0 11/17/2012 1440   CL 105 11/17/2012 1440   CO2 26 11/17/2012 1440   GLUCOSE 106* 11/17/2012 1440   BUN 10 11/17/2012 1440   CREATININE 0.66 11/17/2012 1440   CALCIUM 8.7 11/17/2012 1440   PROT 7.6 06/21/2011 0135   ALBUMIN 2.3* 06/21/2011 0135   AST 21 06/21/2011 0135   ALT 12 06/21/2011 0135   ALKPHOS 101 06/21/2011 0135   BILITOT 0.3 06/21/2011 0135   GFRNONAA 77* 11/17/2012 1440   GFRAA 89* 11/17/2012 1440    Assessment and Plan  INTERTRIGO- under R breast; nystatin powder has been used daily after washing and the problem is resolving . Will continue powder for another week.  Claudia Duos, MD

## 2013-07-22 ENCOUNTER — Non-Acute Institutional Stay (SKILLED_NURSING_FACILITY): Payer: PRIVATE HEALTH INSURANCE | Admitting: Nurse Practitioner

## 2013-07-22 DIAGNOSIS — K5900 Constipation, unspecified: Secondary | ICD-10-CM

## 2013-07-22 DIAGNOSIS — E1149 Type 2 diabetes mellitus with other diabetic neurological complication: Secondary | ICD-10-CM

## 2013-07-22 DIAGNOSIS — F03918 Unspecified dementia, unspecified severity, with other behavioral disturbance: Secondary | ICD-10-CM

## 2013-07-22 DIAGNOSIS — K219 Gastro-esophageal reflux disease without esophagitis: Secondary | ICD-10-CM

## 2013-07-22 DIAGNOSIS — E785 Hyperlipidemia, unspecified: Secondary | ICD-10-CM

## 2013-07-22 DIAGNOSIS — F0391 Unspecified dementia with behavioral disturbance: Secondary | ICD-10-CM | POA: Insufficient documentation

## 2013-07-22 NOTE — Progress Notes (Signed)
Patient ID: [name], female   DOB: Jan 24, 1924, 80 y.o.   MRN: [id number]    Nursing Home Location:  [place] of Service: SNF (31)  PCP: [name], MD  No Known Allergies  Chief Complaint  Patient presents with  . Medical Managment of Chronic Issues    HPI:  80 year old female who is WC bound with PMH of memory loss, HTN, Hyperlipidemia, GERD, DM, constipation who is now a LTR of [facility] and is being seen today for routine follow up on chronic conditions; staff without any concerns at this time reports pt with ongoing behaviors, calling out , etc   Review of Systems:  Review of Systems  Constitutional: Negative for fever, chills and malaise/fatigue.  HENT:       Denies pain in mouth  Eyes: Negative for blurred vision.  Respiratory: Negative for cough.   Cardiovascular: Negative for chest pain.  Gastrointestinal: Negative for heartburn, abdominal pain and constipation.  Genitourinary: Negative for dysuria.  Musculoskeletal: Negative for joint pain and myalgias.  Skin: Negative.   Neurological: Negative for dizziness, weakness and headaches.  Psychiatric/Behavioral: Positive for memory loss. Negative for depression. The patient is not nervous/anxious.      Past Medical History  Diagnosis Date  . PE (pulmonary embolism) 2011  . Ovarian cancer   . Hyperlipidemia   . Rheumatoid arthritis(714.0)   . GERD (gastroesophageal reflux disease)   . Hypertension   . Anxiety   . Diabetes mellitus   . Diabetes mellitus   . Hx of ovarian cancer 06/21/2011   Past Surgical History  Procedure Laterality Date  . Total hip arthroplasty     Social History:   reports that she has quit smoking. She has quit using smokeless tobacco. She reports that she does not drink alcohol or use illicit drugs.  Family History  Problem Relation Age of Onset  . Diabetes type II      Medications: Patient's Medications  New Prescriptions   No  medications on file  Previous Medications   BUSPIRONE (BUSPAR) 15 MG TABLET    Take 15 mg by mouth 2 (two) times daily.    NYSTATIN (MYCOSTATIN) POWDER    Apply topically 2 (two) times daily.   NYSTATIN CREAM (MYCOSTATIN)    Apply 1 application topically 2 (two) times daily.   OMEPRAZOLE (PRILOSEC) 20 MG CAPSULE    Take 20 mg by mouth every morning.    SIMVASTATIN (ZOCOR) 20 MG TABLET    Take 20 mg by mouth every evening.   TRAMADOL (ULTRAM) 50 MG TABLET    Take 1 tablet (50 mg total) by mouth every 6 (six) hours as needed.   TRAVOPROST, BAK FREE, (TRAVATAN) 0.004 % SOLN OPHTHALMIC SOLUTION    Place 1 drop into both eyes at bedtime.   VITAMIN E 200 UNIT CAPSULE    Take 200 Units by mouth every evening.    ZINC GLUCONATE 50 MG TABLET    Take 50 mg by mouth every evening.   Modified Medications   No medications on file  Discontinued Medications   GLIMEPIRIDE (AMARYL) 2 MG TABLET    Take 2 mg by mouth daily before breakfast.     Physical Exam:  Filed Vitals:   07/22/13 1034  BP: 108/52  Pulse: 68  Temp: 97.2 F (36.2 C)  Resp: 20  Weight: 165 lb (74.844 kg)    Physical Exam  Constitutional: She is well-developed, well-nourished, and  in no distress.  HENT:  Head: Normocephalic and atraumatic.  Mouth/Throat: Oropharynx is clear and moist. No oropharyngeal exudate.  Dentures to upper but lower teeth have decay and are broken   Eyes: Conjunctivae and EOM are normal. Pupils are equal, round, and reactive to light.  Neck: Normal range of motion. Neck supple.  Cardiovascular: Normal rate, regular rhythm and normal heart sounds.   Pulmonary/Chest: Effort normal and breath sounds normal. No respiratory distress.  Abdominal: Soft. Bowel sounds are normal. She exhibits no distension.  Musculoskeletal: She exhibits no edema and no tenderness.  Neurological: She is alert.  Oriented to self only   Skin: Skin is warm and dry.  Psychiatric: Affect normal.     Labs reviewed: Basic  Metabolic Panel:  Recent Labs  11/17/12 1440  NA 137  K 5.0  CL 105  CO2 26  GLUCOSE 106*  BUN 10  CREATININE 0.66  CALCIUM 8.7   Liver Function Tests: No results found for this basename: AST, ALT, ALKPHOS, BILITOT, PROT, ALBUMIN,  in the last 8760 hours No results found for this basename: LIPASE, AMYLASE,  in the last 8760 hours No results found for this basename: AMMONIA,  in the last 8760 hours CBC:  Recent Labs  11/17/12 1440  WBC 4.2  NEUTROABS 2.5  HGB 11.6*  HCT 36.1  MCV 83.0  PLT 184   CMP with Estimated GFR    Result: 07/18/2013 3:56 PM   ( Status: F )       Sodium 137     135-145 mEq/L SLN   Potassium 3.4   L 3.5-5.3 mEq/L SLN   Chloride 104     96-112 mEq/L SLN   CO2 25     19-32 mEq/L SLN   Glucose 166   H 70-99 mg/dL SLN   BUN 11     6-23 mg/dL SLN   Creatinine 0.86     0.50-1.10 mg/dL SLN   Bilirubin, Total 0.3     0.2-1.2 mg/dL SLN   Alkaline Phosphatase 48     39-117 U/L SLN   AST/SGOT 11     0-37 U/L SLN   ALT/SGPT <8     0-35 U/L SLN   Total Protein 6.5     6.0-8.3 g/dL SLN   Albumin 2.9   L 3.5-5.2 g/dL SLN   Calcium 8.6     8.4-10.5 mg/dL SLN   Est GFR, African American 70      mL/min SLN   Est GFR, NonAfrican American 61      mL/min SLN C CBC NO Diff (Complete Blood Count)    Result: 07/18/2013 2:57 PM   ( Status: F )       WBC 4.0     4.0-10.5 K/uL SLN   RBC 4.30     3.87-5.11 MIL/uL SLN   Hemoglobin 10.8   L 12.0-15.0 g/dL SLN   Hematocrit 33.2   L 36.0-46.0 % SLN   MCV 77.2   L 78.0-100.0 fL SLN   MCH 25.1   L 26.0-34.0 pg SLN   MCHC 32.5     30.0-36.0 g/dL SLN   RDW 16.6   H 11.5-15.5 % SLN   Platelet Count 157     150-400 K/uL SLN   Lipid Profile    Result: 07/18/2013 3:56 PM   ( Status: F )       Cholesterol 152     0-200 mg/dL SLN C Triglyceride 152   H <150 mg/dL SLN  HDL Cholesterol 39   L >39 mg/dL SLN   Total Chol/HDL Ratio 3.9      Ratio SLN   VLDL Cholesterol (Calc) 30     0-40 mg/dL SLN   LDL Cholesterol (Calc) 83       0-99 mg/dL SLN C TSH, Ultrasensitive    Result: 07/18/2013 3:38 PM   ( Status: F )       TSH 1.398     0.350-4.500 uIU/mL SLN   Hemoglobin A1C    Result: 07/18/2013 3:20 PM   ( Status: F )       Hemoglobin A1C 6.6   H <5.7 % SLN C Estimated Average Glucose 143   H <117 mg/dL SLN    Assessment/Plan 1. DM (diabetes mellitus), type 2 with neurological complications -stable at this time, conts off medication, fasting blood sugars between 110-140s, will dc am cbgs -A1c at 6.6, will cont to follow   2. GERD (gastroesophageal reflux disease) -stable at this time  3. Constipation -remains controlled on medications  4. Hyperlipidemia - LDL at goal on zocor  5. Dementia with behavioral disturbance -with reports of frequently calling out, pt on medication for anxiety -will start namenda XR titration   6. Weight loss -conts to be followed by RD -conts on supplements  -with poor detention which could be contributing to decrease PO intake- will have pt added to dentist list

## 2013-07-29 ENCOUNTER — Encounter: Payer: Self-pay | Admitting: Nurse Practitioner

## 2013-07-29 ENCOUNTER — Non-Acute Institutional Stay (SKILLED_NURSING_FACILITY): Payer: PRIVATE HEALTH INSURANCE | Admitting: Nurse Practitioner

## 2013-07-29 DIAGNOSIS — IMO0002 Reserved for concepts with insufficient information to code with codable children: Secondary | ICD-10-CM

## 2013-07-29 DIAGNOSIS — R451 Restlessness and agitation: Secondary | ICD-10-CM

## 2013-07-29 DIAGNOSIS — F03918 Unspecified dementia, unspecified severity, with other behavioral disturbance: Secondary | ICD-10-CM

## 2013-07-29 DIAGNOSIS — F0391 Unspecified dementia with behavioral disturbance: Secondary | ICD-10-CM

## 2013-07-29 NOTE — Progress Notes (Signed)
Patient ID: [name], female   DOB: 30-Aug-1923, 80 y.o.   MRN: [id number]    Nursing Home Location:  [place] of Service: SNF (31)  PCP: [name], MD  No Known Allergies  Chief Complaint  Patient presents with  . Acute Visit    HPI:  80 year old female who is WC bound with PMH of memory loss, HTN, Hyperlipidemia, GERD, DM, constipation who is being seen today at the request of nursing. pts family would like her started on something due to increase anxiety and agitation; granddaughter and staff reports this has slowly gotten worse, pt  more frequently annoyed and tells people she needs to go to work and go other places. Not being able to be redirected easily causing the pt more anxiety.   Review of Systems:  ROS FROM STAFF, GRANDDAUGHTER, AND PT Review of Systems  Constitutional: Negative for fever, chills and malaise/fatigue.  Eyes: Negative for blurred vision.  Respiratory: Negative for cough and shortness of breath.   Cardiovascular: Negative for chest pain and leg swelling.  Gastrointestinal: Negative for heartburn, nausea, vomiting, abdominal pain and constipation.  Genitourinary: Negative for dysuria, urgency and frequency.  Musculoskeletal: Negative for joint pain and myalgias.  Skin: Negative.   Neurological: Negative for dizziness, weakness and headaches.  Psychiatric/Behavioral: Positive for memory loss. Negative for depression. The patient is not nervous/anxious.      Past Medical History  Diagnosis Date  . PE (pulmonary embolism) 2011  . Ovarian cancer   . Hyperlipidemia   . Rheumatoid arthritis(714.0)   . GERD (gastroesophageal reflux disease)   . Hypertension   . Anxiety   . Diabetes mellitus   . Diabetes mellitus   . Hx of ovarian cancer 06/21/2011   Past Surgical History  Procedure Laterality Date  . Total hip arthroplasty     Social History:   reports that she has quit smoking. She has quit using smokeless tobacco.  She reports that she does not drink alcohol or use illicit drugs.  Family History  Problem Relation Age of Onset  . Diabetes type II      Medications: Patient's Medications  New Prescriptions   No medications on file  Previous Medications   BUSPIRONE (BUSPAR) 15 MG TABLET    Take 15 mg by mouth 2 (two) times daily.    MEMANTINE (NAMENDA TITRATION PACK) TABLET PACK    Take by mouth See admin instructions. 5 mg/day for =1 week; 5 mg twice daily for =1 week; 15 mg/day given in 5 mg and 10 mg separated doses for =1 week; then 10 mg twice daily   NYSTATIN (MYCOSTATIN) POWDER    Apply topically 2 (two) times daily.   NYSTATIN CREAM (MYCOSTATIN)    Apply 1 application topically 2 (two) times daily.   OMEPRAZOLE (PRILOSEC) 20 MG CAPSULE    Take 20 mg by mouth every morning.    SIMVASTATIN (ZOCOR) 20 MG TABLET    Take 20 mg by mouth every evening.   TRAMADOL (ULTRAM) 50 MG TABLET    Take 1 tablet (50 mg total) by mouth every 6 (six) hours as needed.   TRAVOPROST, BAK FREE, (TRAVATAN) 0.004 % SOLN OPHTHALMIC SOLUTION    Place 1 drop into both eyes at bedtime.   VITAMIN E 200 UNIT CAPSULE    Take 200 Units by mouth every evening.    ZINC GLUCONATE 50 MG TABLET    Take 50 mg by mouth every evening.  Modified Medications   No medications on file  Discontinued Medications   No medications on file     Physical Exam:  Filed Vitals:   07/29/13 1629  BP: 115/62  Pulse: 70  Temp: 98.4 F (36.9 C)  Resp: 20   Physical Exam  Constitutional: She is well-developed, well-nourished, and in no distress.  HENT:  Head: Normocephalic and atraumatic.  Right Ear: External ear normal.  Left Ear: External ear normal.  Nose: Nose normal.  Mouth/Throat: Oropharynx is clear and moist. No oropharyngeal exudate.  Dentures to upper but lower teeth have decay and are broken -- will be seen by dental next month  Eyes: Conjunctivae and EOM are normal. Pupils are equal, round, and reactive to light.  Neck:  Normal range of motion. Neck supple.  Cardiovascular: Normal rate, regular rhythm and normal heart sounds.   Pulmonary/Chest: Effort normal and breath sounds normal. No respiratory distress.  Abdominal: Soft. Bowel sounds are normal. She exhibits no distension.  Musculoskeletal: She exhibits no edema and no tenderness.  Neurological: She is alert.  Oriented to self only   Skin: Skin is warm and dry.  Psychiatric: Affect normal.      Labs reviewed: Basic Metabolic Panel:  Recent Labs  11/17/12 1440  NA 137  K 5.0  CL 105  CO2 26  GLUCOSE 106*  BUN 10  CREATININE 0.66  CALCIUM 8.7   CBC:  Recent Labs  11/17/12 1440  WBC 4.2  NEUTROABS 2.5  HGB 11.6*  HCT 36.1  MCV 83.0  PLT 184    CMP with Estimated GFR    Result: 07/18/2013 3:56 PM   ( Status: F )       Sodium 137     135-145 mEq/L SLN   Potassium 3.4   L 3.5-5.3 mEq/L SLN   Chloride 104     96-112 mEq/L SLN   CO2 25     19-32 mEq/L SLN   Glucose 166   H 70-99 mg/dL SLN   BUN 11     6-23 mg/dL SLN   Creatinine 0.86     0.50-1.10 mg/dL SLN   Bilirubin, Total 0.3     0.2-1.2 mg/dL SLN   Alkaline Phosphatase 48     39-117 U/L SLN   AST/SGOT 11     0-37 U/L SLN   ALT/SGPT <8     0-35 U/L SLN   Total Protein 6.5     6.0-8.3 g/dL SLN   Albumin 2.9   L 3.5-5.2 g/dL SLN   Calcium 8.6     8.4-10.5 mg/dL SLN   Est GFR, African American 70      mL/min SLN   Est GFR, NonAfrican American 61      mL/min SLN C CBC NO Diff (Complete Blood Count)    Result: 07/18/2013 2:57 PM   ( Status: F )       WBC 4.0     4.0-10.5 K/uL SLN   RBC 4.30     3.87-5.11 MIL/uL SLN   Hemoglobin 10.8   L 12.0-15.0 g/dL SLN   Hematocrit 33.2   L 36.0-46.0 % SLN   MCV 77.2   L 78.0-100.0 fL SLN   MCH 25.1   L 26.0-34.0 pg SLN   MCHC 32.5     30.0-36.0 g/dL SLN   RDW 16.6   H 11.5-15.5 % SLN   Platelet Count 157     150-400 K/uL SLN   Lipid Profile    Result:  07/18/2013 3:56 PM   ( Status: F )       Cholesterol 152      0-200 mg/dL SLN C Triglyceride 152   H <150 mg/dL SLN   HDL Cholesterol 39   L >39 mg/dL SLN   Total Chol/HDL Ratio 3.9      Ratio SLN   VLDL Cholesterol (Calc) 30     0-40 mg/dL SLN   LDL Cholesterol (Calc) 83     0-99 mg/dL SLN C TSH, Ultrasensitive    Result: 07/18/2013 3:38 PM   ( Status: F )       TSH 1.398     0.350-4.500 uIU/mL SLN   Hemoglobin A1C    Result: 07/18/2013 3:20 PM   ( Status: F )       Hemoglobin A1C 6.6   H <5.7 % SLN C Estimated Average Glucose 143   H <117 mg/dL SLN  Assessment/Plan 1. Dementia with behavioral disturbance -with worsening behaviors at night, conts namenda titration which should help this -will start Risperdal 0.25 qhs to help with behaviors and anxiety related to dementia  2. Agitation -granddaughter reports she gets frequent UTIs - was recently treated for UTI so will get UA C&S for follow up

## 2013-08-03 ENCOUNTER — Non-Acute Institutional Stay (SKILLED_NURSING_FACILITY): Payer: PRIVATE HEALTH INSURANCE | Admitting: Internal Medicine

## 2013-08-03 ENCOUNTER — Encounter: Payer: Self-pay | Admitting: Internal Medicine

## 2013-08-03 DIAGNOSIS — N390 Urinary tract infection, site not specified: Secondary | ICD-10-CM

## 2013-08-03 NOTE — Progress Notes (Signed)
MRN: [id number] Name: [name]  Sex: female Age: 80 y.o. DOB: 1923/12/09  [place] #: [name] Facility/Room: 101B Level Of Care: SNF Provider: [name] Emergency Contacts: Extended Emergency Contact Information Primary Emergency Contact: [name], [place] Phone: [phone] Work Phone: [phone] Relation: Daughter Secondary Emergency Contact: [name] Phone: [phone] Relation: Grandaughter   Allergies: Review of patient's allergies indicates no known allergies.  Chief Complaint  Patient presents with  . Acute Visit    HPI: Patient is 80 y.o. female who is being seen today for treatment of a UTI.  Past Medical History  Diagnosis Date  . PE (pulmonary embolism) 2011  . Ovarian cancer   . Hyperlipidemia   . Rheumatoid arthritis(714.0)   . GERD (gastroesophageal reflux disease)   . Hypertension   . Anxiety   . Diabetes mellitus   . Diabetes mellitus   . Hx of ovarian cancer 06/21/2011    Past Surgical History  Procedure Laterality Date  . Total hip arthroplasty        Medication List       This list is accurate as of: 08/03/13 11:59 PM.  Always use your most recent med list.               busPIRone 15 MG tablet  Commonly known as:  BUSPAR  Take 15 mg by mouth 2 (two) times daily.     memantine tablet pack  Commonly known as:  NAMENDA TITRATION PACK  Take by mouth See admin instructions. 5 mg/day for =1 week; 5 mg twice daily for =1 week; 15 mg/day given in 5 mg and 10 mg separated doses for =1 week; then 10 mg twice daily     nystatin cream  Commonly known as:  MYCOSTATIN  Apply 1 application topically 2 (two) times daily.     nystatin powder  Commonly known as:  MYCOSTATIN  Apply topically 2 (two) times daily.     omeprazole 20 MG capsule  Commonly known as:  PRILOSEC  Take 20 mg by mouth every morning.     simvastatin 20 MG tablet   Commonly known as:  ZOCOR  Take 20 mg by mouth every evening.     traMADol 50 MG tablet  Commonly known as:  ULTRAM  Take 1 tablet (50 mg total) by mouth every 6 (six) hours as needed.     Travoprost (BAK Free) 0.004 % Soln ophthalmic solution  Commonly known as:  TRAVATAN  Place 1 drop into both eyes at bedtime.     vitamin E 200 UNIT capsule  Take 200 Units by mouth every evening.     zinc gluconate 50 MG tablet  Take 50 mg by mouth every evening.        No orders of the defined types were placed in this encounter.    Immunization History  Administered Date(s) Administered  . Tdap 07/01/2011, 06/11/2012    History  Substance Use Topics  . Smoking status: Former Research scientist (life sciences)  . Smokeless tobacco: Former Systems developer  . Alcohol Use: No    Review of Systems  DATA OBTAINED: from nurse- sx were MS change  GENERAL: Feels well no fevers, fatigue, appetite changes SKIN: No itching, rash HEENT: No complaint RESPIRATORY: No cough, wheezing, SOB CARDIAC: No chest pain, palpitations, lower extremity edema  GI: No abdominal pain, No N/V/D or constipation, No heartburn or reflux  GU:  No dysuria, frequency or urgency, or incontinence  MUSCULOSKELETAL: No unrelieved bone/joint pain NEUROLOGIC: No headache, dizziness or focal weakness PSYCHIATRIC: No overt anxiety or sadness. Sleeps well.   Filed Vitals:   08/03/13 1525  BP: 120/64  Pulse: 69  Temp: 97.1 F (36.2 C)  Resp: 19    Physical Exam  GENERAL APPEARANCE: Alert,mod conversant. Appropriately groomed. No acute distress  SKIN: No diaphoresis rash, or wounds HEENT: Unremarkable RESPIRATORY: Breathing is even, unlabored. Lung sounds are clear   CARDIOVASCULAR: Heart RRR no murmurs, rubs or gallops. No peripheral edema  GASTROINTESTINAL: Abdomen is soft, non-tender, not distended w/ normal bowel sounds.  GENITOURINARY: Bladder non tender, not distended  MUSCULOSKELETAL: No abnormal joints or musculature NEUROLOGIC: Cranial  nerves 2-12 grossly intact. Moves all extremities no tremor. PSYCHIATRIC: dementia, no behavioral issues  Patient Active Problem List   Diagnosis Date Noted  . Dementia with behavioral disturbance 07/22/2013  . Obesity, unspecified 05/16/2013  . GERD (gastroesophageal reflux disease)   . Hypertension   . Anxiety   . DM (diabetes mellitus), type 2 with neurological complications   . Constipation 06/23/2011  . Hyperlipidemia 06/21/2011  . Rheumatoid arthritis 06/21/2011  . Hx pulmonary embolism 06/21/2011  . Wheelchair bound 06/21/2011    CBC    Component Value Date/Time   WBC 4.2 11/17/2012 1440   RBC 4.35 11/17/2012 1440   HGB 11.6* 11/17/2012 1440   HCT 36.1 11/17/2012 1440   PLT 184 11/17/2012 1440   MCV 83.0 11/17/2012 1440   LYMPHSABS 1.3 11/17/2012 1440   MONOABS 0.2 11/17/2012 1440   EOSABS 0.1 11/17/2012 1440   BASOSABS 0.0 11/17/2012 1440    CMP     Component Value Date/Time   NA 137 11/17/2012 1440   K 5.0 11/17/2012 1440   CL 105 11/17/2012 1440   CO2 26 11/17/2012 1440   GLUCOSE 106* 11/17/2012 1440   BUN 10 11/17/2012 1440   CREATININE 0.66 11/17/2012 1440   CALCIUM 8.7 11/17/2012 1440   PROT 7.6 06/21/2011 0135   ALBUMIN 2.3* 06/21/2011 0135   AST 21 06/21/2011 0135   ALT 12 06/21/2011 0135   ALKPHOS 101 06/21/2011 0135   BILITOT 0.3 06/21/2011 0135   GFRNONAA 77* 11/17/2012 1440   GFRAA 89* 11/17/2012 1440    Assessment and Plan  UTI - pt grew out > 50,000 Proteus and an MIC was done. Will treat since pt symptomatic. Amixil 875 BID for 7 days.  [name], MD

## 2013-08-04 ENCOUNTER — Encounter: Payer: Self-pay | Admitting: Internal Medicine

## 2013-08-12 ENCOUNTER — Non-Acute Institutional Stay (SKILLED_NURSING_FACILITY): Payer: PRIVATE HEALTH INSURANCE | Admitting: Nurse Practitioner

## 2013-08-12 DIAGNOSIS — K5900 Constipation, unspecified: Secondary | ICD-10-CM

## 2013-08-12 DIAGNOSIS — F03918 Unspecified dementia, unspecified severity, with other behavioral disturbance: Secondary | ICD-10-CM

## 2013-08-12 DIAGNOSIS — F419 Anxiety disorder, unspecified: Secondary | ICD-10-CM

## 2013-08-12 DIAGNOSIS — K219 Gastro-esophageal reflux disease without esophagitis: Secondary | ICD-10-CM

## 2013-08-12 DIAGNOSIS — F411 Generalized anxiety disorder: Secondary | ICD-10-CM

## 2013-08-12 DIAGNOSIS — E785 Hyperlipidemia, unspecified: Secondary | ICD-10-CM

## 2013-08-12 DIAGNOSIS — I10 Essential (primary) hypertension: Secondary | ICD-10-CM

## 2013-08-12 DIAGNOSIS — F0391 Unspecified dementia with behavioral disturbance: Secondary | ICD-10-CM

## 2013-08-12 DIAGNOSIS — E1149 Type 2 diabetes mellitus with other diabetic neurological complication: Secondary | ICD-10-CM

## 2013-08-12 NOTE — Progress Notes (Signed)
Patient ID: [name], female   DOB: [birth year], 80 y.o.   MRN: [id number]    Nursing Home Location:  [place] of Service: SNF (31)  PCP: Claudia Spurling, MD  No Known Allergies  Chief Complaint  Patient presents with  . Medical Management of Chronic Issues    HPI:  80 year old female who is WC bound with PMH of memory loss, HTN, Hyperlipidemia, GERD, DM, constipation who is being seen today for routine follow up on chronic conditions.  In the last month pt was seen for agitation which was worse at night, started on namenda and then Risperdal for behaviors- which have improved significantly; also treated for UTI. Overall pt is doing well, she has no complaints and staff without concerns.   Review of Systems:  Review of Systems  Constitutional: Negative for fever, chills and malaise/fatigue.  Eyes: Negative for blurred vision.  Respiratory: Negative for cough and shortness of breath.   Cardiovascular: Negative for chest pain and leg swelling.  Gastrointestinal: Negative for heartburn, nausea, vomiting, abdominal pain, diarrhea and constipation.  Genitourinary: Negative for dysuria, urgency and frequency.  Musculoskeletal: Negative for joint pain and myalgias.  Skin: Negative.   Neurological: Negative for dizziness, weakness and headaches.  Psychiatric/Behavioral: Positive for memory loss. Negative for depression. The patient is not nervous/anxious.     Past Medical History  Diagnosis Date  . PE (pulmonary embolism) 2011  . Ovarian cancer   . Hyperlipidemia   . Rheumatoid arthritis(714.0)   . GERD (gastroesophageal reflux disease)   . Hypertension   . Anxiety   . Diabetes mellitus   . Diabetes mellitus   . Hx of ovarian cancer 06/21/2011   Past Surgical History  Procedure Laterality Date  . Total hip arthroplasty     Social History:   reports that she has quit smoking. She has quit using smokeless tobacco. She reports that she does not drink  alcohol or use illicit drugs.  Family History  Problem Relation Age of Onset  . Diabetes type II      Medications: Patient's Medications  New Prescriptions   No medications on file  Previous Medications   BUSPIRONE (BUSPAR) 15 MG TABLET    Take 15 mg by mouth 2 (two) times daily.    MEMANTINE HCL ER (NAMENDA XR) 28 MG CP24    Take 28 mg by mouth daily.   NYSTATIN (MYCOSTATIN) POWDER    Apply topically 2 (two) times daily.   NYSTATIN CREAM (MYCOSTATIN)    Apply 1 application topically 2 (two) times daily.   OMEPRAZOLE (PRILOSEC) 20 MG CAPSULE    Take 20 mg by mouth every morning.    RISPERIDONE (RISPERDAL) 0.25 MG TABLET    Take 0.25 mg by mouth at bedtime.   SIMVASTATIN (ZOCOR) 20 MG TABLET    Take 20 mg by mouth every evening.   TRAMADOL (ULTRAM) 50 MG TABLET    Take 1 tablet (50 mg total) by mouth every 6 (six) hours as needed.   TRAVOPROST, BAK FREE, (TRAVATAN) 0.004 % SOLN OPHTHALMIC SOLUTION    Place 1 drop into both eyes at bedtime.   VITAMIN E 200 UNIT CAPSULE    Take 200 Units by mouth every evening.    ZINC GLUCONATE 50 MG TABLET    Take 50 mg by mouth every evening.   Modified Medications   No medications on file  Discontinued Medications   MEMANTINE (NAMENDA TITRATION PACK) TABLET PACK  Take by mouth See admin instructions. 5 mg/day for =1 week; 5 mg twice daily for =1 week; 15 mg/day given in 5 mg and 10 mg separated doses for =1 week; then 10 mg twice daily     Physical Exam:  Filed Vitals:   08/12/13 1204  BP: 123/67  Pulse: 72  Temp: 97.1 F (36.2 C)  Resp: 18  Weight: 169 lb (76.658 kg)   Physical Exam  Constitutional: She is well-developed, well-nourished, and in no distress.  HENT:  Head: Normocephalic and atraumatic.  Right Ear: External ear normal.  Left Ear: External ear normal.  Nose: Nose normal.  Mouth/Throat: Oropharynx is clear and moist. No oropharyngeal exudate.  Poor dentition expected to see dentist soon  Eyes: Conjunctivae and EOM are  normal. Pupils are equal, round, and reactive to light.  Neck: Normal range of motion. Neck supple.  Cardiovascular: Normal rate, regular rhythm and normal heart sounds.   Pulmonary/Chest: Effort normal and breath sounds normal. No respiratory distress.  Abdominal: Soft. Bowel sounds are normal. She exhibits no distension. There is no tenderness. There is no rebound.  Musculoskeletal: She exhibits no edema and no tenderness.  Neurological: She is alert.  Oriented to self only   Skin: Skin is warm and dry.  Psychiatric: Affect normal.      Labs reviewed: Basic Metabolic Panel:  Recent Labs  11/17/12 1440  NA 137  K 5.0  CL 105  CO2 26  GLUCOSE 106*  BUN 10  CREATININE 0.66  CALCIUM 8.7    Recent Labs  11/17/12 1440  WBC 4.2  NEUTROABS 2.5  HGB 11.6*  HCT 36.1  MCV 83.0  PLT 184  Result: 07/18/2013 3:56 PM ( Status: F )  Sodium 137 135-145 mEq/L SLN  Potassium 3.4 L 3.5-5.3 mEq/L SLN  Chloride 104 96-112 mEq/L SLN  CO2 25 19-32 mEq/L SLN  Glucose 166 H 70-99 mg/dL SLN  BUN 11 6-23 mg/dL SLN  Creatinine 0.86 0.50-1.10 mg/dL SLN  Bilirubin, Total 0.3 0.2-1.2 mg/dL SLN  Alkaline Phosphatase 48 39-117 U/L SLN  AST/SGOT 11 0-37 U/L SLN  ALT/SGPT <8 0-35 U/L SLN  Total Protein 6.5 6.0-8.3 g/dL SLN  Albumin 2.9 L 3.5-5.2 g/dL SLN  Calcium 8.6 8.4-10.5 mg/dL SLN  Est GFR, African American 70 mL/min SLN  Est GFR, NonAfrican American 61 mL/min SLN C  CBC NO Diff (Complete Blood Count)  Result: 07/18/2013 2:57 PM ( Status: F )  WBC 4.0 4.0-10.5 K/uL SLN  RBC 4.30 3.87-5.11 MIL/uL SLN  Hemoglobin 10.8 L 12.0-15.0 g/dL SLN  Hematocrit 33.2 L 36.0-46.0 % SLN  MCV 77.2 L 78.0-100.0 fL SLN  MCH 25.1 L 26.0-34.0 pg SLN  MCHC 32.5 30.0-36.0 g/dL SLN  RDW 16.6 H 11.5-15.5 % SLN  Platelet Count 157 150-400 K/uL SLN  Lipid Profile  Result: 07/18/2013 3:56 PM ( Status: F )  Cholesterol 152 0-200 mg/dL SLN C  Triglyceride 152 H <150 mg/dL SLN  HDL Cholesterol 39 L >39 mg/dL  SLN  Total Chol/HDL Ratio 3.9 Ratio SLN  VLDL Cholesterol (Calc) 30 0-40 mg/dL SLN  LDL Cholesterol (Calc) 83 0-99 mg/dL SLN C  TSH, Ultrasensitive  Result: 07/18/2013 3:38 PM ( Status: F )  TSH 1.398 0.350-4.500 uIU/mL SLN  Hemoglobin A1C  Result: 07/18/2013 3:20 PM ( Status: F )  Hemoglobin A1C 6.6 H <5.7 % SLN C  Estimated Average Glucose 143 H <117 mg/dL SLN  Assessment/Plan  Assessment/Plan 1. Hypertension Stable at this time  2. Dementia  with behavioral disturbance -conts with namenda and risperdal, no adverse effects noted staff reports pt behaviors have imporoved  3. DM (diabetes mellitus), type 2 with neurological complications -diet controlled A1c at 6.6  4. GERD (gastroesophageal reflux disease) -without complaints  5. Constipation Without complaints  6. Anxiety -has remained stable, conts on buspar  7. Hyperlipidemia -LDL at goal on zocor

## 2013-09-09 ENCOUNTER — Non-Acute Institutional Stay (SKILLED_NURSING_FACILITY): Payer: PRIVATE HEALTH INSURANCE | Admitting: Nurse Practitioner

## 2013-09-09 DIAGNOSIS — F419 Anxiety disorder, unspecified: Secondary | ICD-10-CM

## 2013-09-09 DIAGNOSIS — K5900 Constipation, unspecified: Secondary | ICD-10-CM

## 2013-09-09 DIAGNOSIS — F0391 Unspecified dementia with behavioral disturbance: Secondary | ICD-10-CM

## 2013-09-09 DIAGNOSIS — D649 Anemia, unspecified: Secondary | ICD-10-CM

## 2013-09-09 DIAGNOSIS — E1149 Type 2 diabetes mellitus with other diabetic neurological complication: Secondary | ICD-10-CM

## 2013-09-09 DIAGNOSIS — F03918 Unspecified dementia, unspecified severity, with other behavioral disturbance: Secondary | ICD-10-CM

## 2013-09-09 DIAGNOSIS — D509 Iron deficiency anemia, unspecified: Secondary | ICD-10-CM | POA: Insufficient documentation

## 2013-09-09 DIAGNOSIS — F411 Generalized anxiety disorder: Secondary | ICD-10-CM

## 2013-09-09 DIAGNOSIS — I10 Essential (primary) hypertension: Secondary | ICD-10-CM

## 2013-09-09 DIAGNOSIS — K219 Gastro-esophageal reflux disease without esophagitis: Secondary | ICD-10-CM

## 2013-09-09 NOTE — Progress Notes (Signed)
Patient ID: [name], female   DOB: 03/03/1924, 80 y.o.   MRN: [id number]    Nursing Home Location:  [place] of Service: SNF (31)  PCP: [name], MD  No Known Allergies  Chief Complaint  Patient presents with  . Medical Management of Chronic Issues    HPI:  80 year old female who is WC bound with PMH of memory loss, HTN, Hyperlipidemia, GERD, DM, constipation who is being seen today for routine follow up on chronic conditions. Still having some agitation but overall better. Overall pt is doing well, she has no complaints and Williams without new concerns.    Review of Systems:  Review of Systems  Unable to perform ROS: dementia  Constitutional: Negative for fever, chills and malaise/fatigue.  Eyes: Negative for blurred vision.  Respiratory: Negative for cough and shortness of breath.   Cardiovascular: Negative for chest pain and leg swelling.  Gastrointestinal: Negative for heartburn, abdominal pain, diarrhea and constipation.  Genitourinary: Negative for dysuria, urgency and frequency.  Musculoskeletal: Negative for joint pain and myalgias.  Skin: Negative.   Neurological: Negative for dizziness, weakness and headaches.  Psychiatric/Behavioral: Positive for memory loss. Negative for depression. The patient is not nervous/anxious.      Past Medical History  Diagnosis Date  . PE (pulmonary embolism) 2011  . Ovarian cancer   . Hyperlipidemia   . Rheumatoid arthritis(714.0)   . GERD (gastroesophageal reflux disease)   . Hypertension   . Anxiety   . Diabetes mellitus   . Diabetes mellitus   . Hx of ovarian cancer 06/21/2011   Past Surgical History  Procedure Laterality Date  . Total hip arthroplasty     Social History:   reports that she has quit smoking. She has quit using smokeless tobacco. She reports that she does not drink alcohol or use illicit drugs.  Family History  Problem Relation Age of Onset  . Diabetes type II       Medications: Patient's Medications  New Prescriptions   No medications on file  Previous Medications   BUSPIRONE (BUSPAR) 15 MG TABLET    Take 15 mg by mouth 2 (two) times daily. For anxiety   MEMANTINE HCL ER (NAMENDA XR) 28 MG CP24    Take 28 mg by mouth daily. For dementia   NYSTATIN (MYCOSTATIN) POWDER    Apply topically 2 (two) times daily.   NYSTATIN CREAM (MYCOSTATIN)    Apply 1 application topically 2 (two) times daily.   OMEPRAZOLE (PRILOSEC) 20 MG CAPSULE    Take 20 mg by mouth every morning. For esophageal reflux   SIMVASTATIN (ZOCOR) 20 MG TABLET    Take 20 mg by mouth every evening. For hyperlipidemia   TRAMADOL (ULTRAM) 50 MG TABLET    Take 1 tablet (50 mg total) by mouth every 6 (six) hours as needed.   VITAMIN E 200 UNIT CAPSULE    Take 200 Units by mouth every evening.   Modified Medications   No medications on file  Discontinued Medications   RISPERIDONE (RISPERDAL) 0.25 MG TABLET    Take 0.25 mg by mouth at bedtime.   TRAVOPROST, BAK FREE, (TRAVATAN) 0.004 % SOLN OPHTHALMIC SOLUTION    Place 1 drop into both eyes at bedtime.   ZINC GLUCONATE 50 MG TABLET    Take 50 mg by mouth every evening.      Physical Exam:  Filed Vitals:   09/09/13 1030  BP: 112/56  Pulse: 62  Temp: 98 F (36.7 C)  Resp: 18  Weight: 172 lb (78.019 kg)    Physical Exam  Constitutional: She is well-developed, well-nourished, and in no distress.  HENT:  Head: Normocephalic and atraumatic.  Mouth/Throat: Oropharynx is clear and moist. No oropharyngeal exudate.  Poor dentition   Eyes: Conjunctivae and EOM are normal. Pupils are equal, round, and reactive to light.  Neck: Normal range of motion. Neck supple.  Cardiovascular: Normal rate, regular rhythm and normal heart sounds.   Pulmonary/Chest: Effort normal and breath sounds normal. No respiratory distress.  Abdominal: Soft. Bowel sounds are normal.  Musculoskeletal: She exhibits no edema and no tenderness.  Neurological: She is  alert.  Oriented to self only   Skin: Skin is warm and dry.  Psychiatric: Affect normal.     Labs reviewed: Basic Metabolic Panel:  Recent Labs  11/17/12 1440  NA 137  K 5.0  CL 105  CO2 26  GLUCOSE 106*  BUN 10  CREATININE 0.66  CALCIUM 8.7   Liver Function Tests: No results found for this basename: AST, ALT, ALKPHOS, BILITOT, PROT, ALBUMIN,  in the last 8760 hours No results found for this basename: LIPASE, AMYLASE,  in the last 8760 hours No results found for this basename: AMMONIA,  in the last 8760 hours CBC:  Recent Labs  11/17/12 1440  WBC 4.2  NEUTROABS 2.5  HGB 11.6*  HCT 36.1  MCV 83.0  PLT 184  CBC with Diff    Result: 08/17/2013 2:43 PM   ( Status: F )     C WBC 4.3     4.0-10.5 K/uL SLN   RBC 4.10     3.87-5.11 MIL/uL SLN   Hemoglobin 10.3   L 12.0-15.0 g/dL SLN   Hematocrit 31.6   L 36.0-46.0 % SLN   MCV 77.1   L 78.0-100.0 fL SLN   MCH 25.1   L 26.0-34.0 pg SLN   MCHC 32.6     30.0-36.0 g/dL SLN   RDW 17.3   H 11.5-15.5 % SLN   Platelet Count 161     150-400 K/uL SLN   Granulocyte % 58     43-77 % SLN   Absolute Gran 2.5     1.7-7.7 K/uL SLN   Lymph % 29     12-46 % SLN   Absolute Lymph 1.2     0.7-4.0 K/uL SLN   Mono % 7     3-12 % SLN   Absolute Mono 0.3     0.1-1.0 K/uL SLN   Eos % 5     0-5 % SLN   Absolute Eos 0.2     0.0-0.7 K/uL SLN   Baso % 1     0-1 % SLN   Absolute Baso 0.0     0.0-0.1 K/uL SLN   Smear Review Criteria for review not met  SLN   Basic Metabolic Panel    Result: 08/17/2013 2:58 PM   ( Status: F )       Sodium 140     135-145 mEq/L SLN   Potassium 4.0     3.5-5.3 mEq/L SLN   Chloride 109     96-112 mEq/L SLN   CO2 24     19-32 mEq/L SLN   Glucose 107   H 70-99 mg/dL SLN   BUN 16     6-23 mg/dL SLN   Creatinine 0.86     0.50-1.10 mg/dL SLN   Calcium 8.5  8.4-10.5 mg/dL SLN   Lipid Profile    Result: 08/17/2013 2:58 PM   ( Status: F )       Cholesterol 153     0-200 mg/dL SLN C Triglyceride 145      <150 mg/dL SLN   HDL Cholesterol 37   L >39 mg/dL SLN   Total Chol/HDL Ratio 4.1      Ratio SLN   VLDL Cholesterol (Calc) 29     0-40 mg/dL SLN   LDL Cholesterol (Calc) 87     0-99 mg/dL SLN C Hemoglobin A1C    Result: 08/17/2013 4:39 PM   ( Status: F )       Hemoglobin A1C 6.5   H <5.7 % SLN C Estimated Average Glucose 140   H <117 mg/dL SLN  Assessment/Plan 1. Hypertension Not on medication at this time  2. Constipation -controlled at this time   3. GERD (gastroesophageal reflux disease) -does not have complaints on omeprazole   4. Dementia with behavioral disturbance -stable at this time, with ongoing behaviors but improved   5. Anxiety Stable at this time  6. Anemia -will follow up cbc with iron studies due to mircocytic anemia   7. Diabetes -A1c 6.5, not on any diabetic medications at this time

## 2013-09-23 ENCOUNTER — Non-Acute Institutional Stay (SKILLED_NURSING_FACILITY): Payer: PRIVATE HEALTH INSURANCE | Admitting: Nurse Practitioner

## 2013-09-23 DIAGNOSIS — D509 Iron deficiency anemia, unspecified: Secondary | ICD-10-CM

## 2013-09-23 DIAGNOSIS — F0391 Unspecified dementia with behavioral disturbance: Secondary | ICD-10-CM

## 2013-09-23 DIAGNOSIS — F03918 Unspecified dementia, unspecified severity, with other behavioral disturbance: Secondary | ICD-10-CM

## 2013-09-23 DIAGNOSIS — F419 Anxiety disorder, unspecified: Secondary | ICD-10-CM

## 2013-09-23 DIAGNOSIS — F411 Generalized anxiety disorder: Secondary | ICD-10-CM

## 2013-09-23 NOTE — Progress Notes (Signed)
Patient ID: [name], female   DOB: Feb 01, 1924, 80 y.o.   MRN: [id number]  Nursing Home Location:  [facility]   Place of Service: SNF (31)  Chief Complaint  Patient presents with  . Acute Visit    HPI:  80 year old female who is WC bound with PMH of memory loss, HTN, Hyperlipidemia, GERD, DM, constipation who is being seen today due to lab results and increase agitation.  Pts risperdal was stopped by psych about 2 weeks ago and since then behaviors have worsen. Pt is more agitation at night, hitting her bedside table, needing frequent visits to room by nursing staff. Pt with history of anxiety and on buspar.  Pt also with new microcytic anemia, blood work reveals iron def Pt denies chest pains, palpitations, dizziness but does report shortness of breath at times.   Review of Systems:  Review of Systems  Constitutional: Negative for malaise/fatigue.  Respiratory: Positive for shortness of breath (at times hard to take a deep breath). Negative for cough, hemoptysis, sputum production and wheezing.   Cardiovascular: Negative for chest pain and palpitations.  Gastrointestinal: Negative for abdominal pain, diarrhea, constipation and blood in stool.  Genitourinary: Negative for dysuria.  Musculoskeletal: Negative for myalgias.  Skin: Negative.   Neurological: Negative for dizziness, weakness and headaches.  Psychiatric/Behavioral: Positive for memory loss. Negative for depression. The patient is nervous/anxious.     Medications: Patient's Medications  New Prescriptions   No medications on file  Previous Medications   BUSPIRONE (BUSPAR) 15 MG TABLET    Take 15 mg by mouth 2 (two) times daily. For anxiety   MEMANTINE HCL ER (NAMENDA XR) 28 MG CP24    Take 28 mg by mouth daily. For dementia   NYSTATIN (MYCOSTATIN) POWDER    Apply topically 2 (two) times daily.   NYSTATIN CREAM (MYCOSTATIN)    Apply 1 application topically 2 (two) times daily.   OMEPRAZOLE (PRILOSEC) 20  MG CAPSULE    Take 20 mg by mouth every morning. For esophageal reflux   SIMVASTATIN (ZOCOR) 20 MG TABLET    Take 20 mg by mouth every evening. For hyperlipidemia   TRAMADOL (ULTRAM) 50 MG TABLET    Take 1 tablet (50 mg total) by mouth every 6 (six) hours as needed.   VITAMIN E 200 UNIT CAPSULE    Take 200 Units by mouth every evening.   Modified Medications   No medications on file  Discontinued Medications   No medications on file     Physical Exam:  Filed Vitals:   09/23/13 1726  BP: 130/74  Pulse: 80  Temp: 98 F (36.7 C)  Resp: 20    Physical Exam  Constitutional: She is well-developed, well-nourished, and in no distress.  Neck: Normal range of motion. Neck supple.  Cardiovascular: Normal rate, regular rhythm and normal heart sounds.   Pulmonary/Chest: Effort normal and breath sounds normal. No respiratory distress.  Abdominal: Soft. Bowel sounds are normal.  Musculoskeletal: She exhibits no edema and no tenderness.  Neurological: She is alert.  Oriented to self only   Skin: Skin is warm and dry.  Psychiatric: Affect normal.      Labs reviewed/Significant Diagnostic Results: CBC with Diff  Result: 08/17/2013 2:43 PM ( Status: F ) C  WBC 4.3 4.0-10.5 K/uL SLN  RBC 4.10 3.87-5.11 MIL/uL SLN  Hemoglobin 10.3 L 12.0-15.0 g/dL SLN  Hematocrit 31.6 L 36.0-46.0 % SLN  MCV 77.1 L 78.0-100.0 fL SLN  MCH 25.1  L 26.0-34.0 pg SLN  MCHC 32.6 30.0-36.0 g/dL SLN  RDW 17.3 H 11.5-15.5 % SLN  Platelet Count 161 150-400 K/uL SLN  Granulocyte % 58 43-77 % SLN  Absolute Gran 2.5 1.7-7.7 K/uL SLN  Lymph % 29 12-46 % SLN  Absolute Lymph 1.2 0.7-4.0 K/uL SLN  Mono % 7 3-12 % SLN  Absolute Mono 0.3 0.1-1.0 K/uL SLN  Eos % 5 0-5 % SLN  Absolute Eos 0.2 0.0-0.7 K/uL SLN  Baso % 1 0-1 % SLN  Absolute Baso 0.0 0.0-0.1 K/uL SLN  Smear Review Criteria for review not met SLN  Basic Metabolic Panel  Result: 2/29/7989 2:58 PM ( Status: F )  Sodium 140 135-145 mEq/L SLN  Potassium  4.0 3.5-5.3 mEq/L SLN  Chloride 109 96-112 mEq/L SLN  CO2 24 19-32 mEq/L SLN  Glucose 107 H 70-99 mg/dL SLN  BUN 16 6-23 mg/dL SLN  Creatinine 0.86 0.50-1.10 mg/dL SLN  Calcium 8.5 8.4-10.5 mg/dL SLN  Lipid Profile  Result: 08/17/2013 2:58 PM ( Status: F )  Cholesterol 153 0-200 mg/dL SLN C  Triglyceride 145 <150 mg/dL SLN  HDL Cholesterol 37 L >39 mg/dL SLN  Total Chol/HDL Ratio 4.1 Ratio SLN  VLDL Cholesterol (Calc) 29 0-40 mg/dL SLN  LDL Cholesterol (Calc) 87 0-99 mg/dL SLN C  Hemoglobin A1C  Result: 08/17/2013 4:39 PM ( Status: F )  Hemoglobin A1C 6.5 H <5.7 % SLN C  Estimated Average Glucose 140 H <117 mg/dL SLN  Iron and IBC    Result: 09/13/2013 3:29 PM   ( Status: F )       Iron 32   L 42-145 ug/dL SLN   UIBC 158     125-400 ug/dL SLN   TIBC 190   L 250-470 ug/dL SLN   %SAT 17   L 20-55 % SLN   CBC with Diff    Result: 09/13/2013 3:29 PM   ( Status: F )       WBC 5.6     4.0-10.5 K/uL SLN   RBC 4.22     3.87-5.11 MIL/uL SLN   Hemoglobin 10.6   L 12.0-15.0 g/dL SLN   Hematocrit 32.2   L 36.0-46.0 % SLN   MCV 76.3   L 78.0-100.0 fL SLN   MCH 25.1   L 26.0-34.0 pg SLN   MCHC 32.9     30.0-36.0 g/dL SLN   RDW 17.0   H 11.5-15.5 % SLN   Platelet Count 219     150-400 K/uL SLN   Granulocyte % 54     43-77 % SLN   Absolute Gran 3.0     1.7-7.7 K/uL SLN   Lymph % 36     12-46 % SLN   Absolute Lymph 2.0     0.7-4.0 K/uL SLN   Mono % 7     3-12 % SLN   Absolute Mono 0.4     0.1-1.0 K/uL SLN   Eos % 3     0-5 % SLN   Absolute Eos 0.2     0.0-0.7 K/uL SLN   Baso % 0     0-1 % SLN   Absolute Baso 0.0     0.0-0.1 K/uL SLN   Smear Review Criteria for review not met  SLN   Ferritin    Result: 09/13/2013 3:58 PM   ( Status: F )       Ferritin 158     10-291 ng/mL SLN  Assessment/Plan  1. Dementia with  behavioral disturbance -worsening behaviors after dc from Risperdal, worse at night  -may be related to anxiety but most likely due to progressive dementia mixed with anxiety  -  will try SSRI in attempt to help behaviors however may need restart to Risperdal since staff reports she was not having behaviors (or was having them minimally) on medication and now behaviors have become worse.  2. Anxiety -will titrate and wean off buspar since this is not effective, will start zoloft 25 mg and increase to 50 mg daily  3. Anemia, iron deficiency -no signs of blood loss, will hemoccult stools times 3 -start ferrous sulfate 325 mg BID with meals and add colace to prevent constipation

## 2013-09-24 ENCOUNTER — Inpatient Hospital Stay (HOSPITAL_COMMUNITY)
Admission: EM | Admit: 2013-09-24 | Discharge: 2013-09-30 | DRG: 291 | Disposition: A | Discharge status: Home / Self Care | Payer: PRIVATE HEALTH INSURANCE | Attending: Internal Medicine | Admitting: Internal Medicine

## 2013-09-24 ENCOUNTER — Emergency Department (HOSPITAL_COMMUNITY): Payer: PRIVATE HEALTH INSURANCE

## 2013-09-24 ENCOUNTER — Encounter (HOSPITAL_COMMUNITY): Payer: Self-pay | Admitting: Emergency Medicine

## 2013-09-24 DIAGNOSIS — F03918 Unspecified dementia, unspecified severity, with other behavioral disturbance: Secondary | ICD-10-CM

## 2013-09-24 DIAGNOSIS — Z86711 Personal history of pulmonary embolism: Secondary | ICD-10-CM

## 2013-09-24 DIAGNOSIS — E1142 Type 2 diabetes mellitus with diabetic polyneuropathy: Secondary | ICD-10-CM | POA: Diagnosis present

## 2013-09-24 DIAGNOSIS — Z993 Dependence on wheelchair: Secondary | ICD-10-CM

## 2013-09-24 DIAGNOSIS — I10 Essential (primary) hypertension: Secondary | ICD-10-CM | POA: Diagnosis present

## 2013-09-24 DIAGNOSIS — Z833 Family history of diabetes mellitus: Secondary | ICD-10-CM

## 2013-09-24 DIAGNOSIS — F419 Anxiety disorder, unspecified: Secondary | ICD-10-CM

## 2013-09-24 DIAGNOSIS — I5023 Acute on chronic systolic (congestive) heart failure: Secondary | ICD-10-CM | POA: Diagnosis present

## 2013-09-24 DIAGNOSIS — F0390 Unspecified dementia without behavioral disturbance: Secondary | ICD-10-CM | POA: Diagnosis present

## 2013-09-24 DIAGNOSIS — Z87891 Personal history of nicotine dependence: Secondary | ICD-10-CM

## 2013-09-24 DIAGNOSIS — T502X5A Adverse effect of carbonic-anhydrase inhibitors, benzothiadiazides and other diuretics, initial encounter: Secondary | ICD-10-CM | POA: Diagnosis present

## 2013-09-24 DIAGNOSIS — N390 Urinary tract infection, site not specified: Secondary | ICD-10-CM | POA: Diagnosis present

## 2013-09-24 DIAGNOSIS — E669 Obesity, unspecified: Secondary | ICD-10-CM

## 2013-09-24 DIAGNOSIS — D509 Iron deficiency anemia, unspecified: Secondary | ICD-10-CM | POA: Diagnosis present

## 2013-09-24 DIAGNOSIS — M069 Rheumatoid arthritis, unspecified: Secondary | ICD-10-CM | POA: Diagnosis present

## 2013-09-24 DIAGNOSIS — E785 Hyperlipidemia, unspecified: Secondary | ICD-10-CM | POA: Diagnosis present

## 2013-09-24 DIAGNOSIS — E1149 Type 2 diabetes mellitus with other diabetic neurological complication: Secondary | ICD-10-CM | POA: Diagnosis present

## 2013-09-24 DIAGNOSIS — Z96649 Presence of unspecified artificial hip joint: Secondary | ICD-10-CM

## 2013-09-24 DIAGNOSIS — Z7401 Bed confinement status: Secondary | ICD-10-CM

## 2013-09-24 DIAGNOSIS — F411 Generalized anxiety disorder: Secondary | ICD-10-CM | POA: Diagnosis present

## 2013-09-24 DIAGNOSIS — I509 Heart failure, unspecified: Secondary | ICD-10-CM | POA: Diagnosis present

## 2013-09-24 DIAGNOSIS — I5043 Acute on chronic combined systolic (congestive) and diastolic (congestive) heart failure: Principal | ICD-10-CM

## 2013-09-24 DIAGNOSIS — K219 Gastro-esophageal reflux disease without esophagitis: Secondary | ICD-10-CM | POA: Diagnosis present

## 2013-09-24 DIAGNOSIS — Z6832 Body mass index (BMI) 32.0-32.9, adult: Secondary | ICD-10-CM

## 2013-09-24 DIAGNOSIS — I959 Hypotension, unspecified: Secondary | ICD-10-CM | POA: Diagnosis not present

## 2013-09-24 DIAGNOSIS — F0391 Unspecified dementia with behavioral disturbance: Secondary | ICD-10-CM

## 2013-09-24 DIAGNOSIS — E440 Moderate protein-calorie malnutrition: Secondary | ICD-10-CM | POA: Insufficient documentation

## 2013-09-24 DIAGNOSIS — Z8543 Personal history of malignant neoplasm of ovary: Secondary | ICD-10-CM

## 2013-09-24 DIAGNOSIS — I5021 Acute systolic (congestive) heart failure: Secondary | ICD-10-CM

## 2013-09-24 DIAGNOSIS — J9600 Acute respiratory failure, unspecified whether with hypoxia or hypercapnia: Secondary | ICD-10-CM | POA: Diagnosis present

## 2013-09-24 DIAGNOSIS — E871 Hypo-osmolality and hyponatremia: Secondary | ICD-10-CM | POA: Diagnosis present

## 2013-09-24 DIAGNOSIS — D649 Anemia, unspecified: Secondary | ICD-10-CM

## 2013-09-24 LAB — CBC WITH DIFFERENTIAL/PLATELET
Basophils Absolute: 0 10*3/uL (ref 0.0–0.1)
Basophils Relative: 1 % (ref 0–1)
EOS ABS: 0.2 10*3/uL (ref 0.0–0.7)
EOS PCT: 3 % (ref 0–5)
HCT: 35.3 % — ABNORMAL LOW (ref 36.0–46.0)
Hemoglobin: 11.7 g/dL — ABNORMAL LOW (ref 12.0–15.0)
Lymphocytes Relative: 30 % (ref 12–46)
Lymphs Abs: 1.6 10*3/uL (ref 0.7–4.0)
MCH: 25.5 pg — AB (ref 26.0–34.0)
MCHC: 33.1 g/dL (ref 30.0–36.0)
MCV: 77.1 fL — ABNORMAL LOW (ref 78.0–100.0)
MONOS PCT: 5 % (ref 3–12)
Monocytes Absolute: 0.3 10*3/uL (ref 0.1–1.0)
NEUTROS PCT: 61 % (ref 43–77)
Neutro Abs: 3.4 10*3/uL (ref 1.7–7.7)
PLATELETS: 193 10*3/uL (ref 150–400)
RBC: 4.58 MIL/uL (ref 3.87–5.11)
RDW: 15.2 % (ref 11.5–15.5)
WBC: 5.5 10*3/uL (ref 4.0–10.5)

## 2013-09-24 LAB — COMPREHENSIVE METABOLIC PANEL
ALT: 6 U/L (ref 0–35)
AST: 14 U/L (ref 0–37)
Albumin: 2.9 g/dL — ABNORMAL LOW (ref 3.5–5.2)
Alkaline Phosphatase: 81 U/L (ref 39–117)
BUN: 17 mg/dL (ref 6–23)
CHLORIDE: 84 meq/L — AB (ref 96–112)
CO2: 22 meq/L (ref 19–32)
CREATININE: 0.83 mg/dL (ref 0.50–1.10)
Calcium: 9.7 mg/dL (ref 8.4–10.5)
GFR calc Af Amer: 71 mL/min — ABNORMAL LOW (ref >90)
GFR, EST NON AFRICAN AMERICAN: 61 mL/min — AB (ref >90)
GLUCOSE: 143 mg/dL — AB (ref 70–99)
Potassium: 5.1 mEq/L (ref 3.7–5.3)
Sodium: 121 mEq/L — CL (ref 137–147)
Total Protein: 8.6 g/dL — ABNORMAL HIGH (ref 6.0–8.3)

## 2013-09-24 LAB — URINE MICROSCOPIC-ADD ON

## 2013-09-24 LAB — PRO B NATRIURETIC PEPTIDE: PRO B NATRI PEPTIDE: 1029 pg/mL — AB (ref 0–450)

## 2013-09-24 LAB — URINALYSIS, ROUTINE W REFLEX MICROSCOPIC
BILIRUBIN URINE: NEGATIVE
GLUCOSE, UA: NEGATIVE mg/dL
KETONES UR: NEGATIVE mg/dL
Nitrite: NEGATIVE
PH: 7 (ref 5.0–8.0)
Protein, ur: NEGATIVE mg/dL
Specific Gravity, Urine: 1.008 (ref 1.005–1.030)
Urobilinogen, UA: 0.2 mg/dL (ref 0.0–1.0)

## 2013-09-24 LAB — TROPONIN I: Troponin I: <0.3 ng/mL (ref <0.30)

## 2013-09-24 LAB — POC OCCULT BLOOD, ED: Fecal Occult Bld: NEGATIVE

## 2013-09-24 MED ORDER — DEXTROSE 5 % IV SOLN
1.0000 g | Freq: Once | INTRAVENOUS | Status: AC
  Administered 2013-09-24: 1 g via INTRAVENOUS
  Filled 2013-09-24: qty 10

## 2013-09-24 NOTE — ED Notes (Signed)
Patient presents to ED via [facility]- patient is from [place]. Patient c/o of feeling "short of breath." Patient was laying in bed when this episode started. Per EMS patient O2 sats were 100% on room air upon their arrival- lungs clear in all fields. Patient is A&Ox4 upon arrival to ED. No acute distress noted at this time. VSS. EKG unremarkable per EMS.

## 2013-09-24 NOTE — ED Provider Notes (Signed)
CSN: [id number]     Arrival date & time 09/24/13  2151 History   First MD Initiated Contact with Patient 09/24/13 2203     Chief Complaint  Patient presents with  . Shortness of Breath     (Consider location/radiation/quality/duration/timing/severity/associated sxs/prior Treatment) HPI Comments: Level V caveat for dementia. Patient from nursing facility with one hour history of feeling shortness of breath and chest tightness. This happened while she was at rest. Patient is bedbound. EMS reports patient saturation was 99% on room air lungs are clear. She is tachypneic and speaking in short phrases. He is oriented x1. She denies any cough or fever. Denies any blood in her stool. Denies any abdominal pain.  The history is provided by the patient and the EMS personnel. The history is limited by the condition of the patient.    Past Medical History  Diagnosis Date  . PE (pulmonary embolism) 2011  . Ovarian cancer   . Hyperlipidemia   . Rheumatoid arthritis(714.0)   . GERD (gastroesophageal reflux disease)   . Hypertension   . Anxiety   . Diabetes mellitus   . Diabetes mellitus   . Hx of ovarian cancer 06/21/2011   Past Surgical History  Procedure Laterality Date  . Total hip arthroplasty     Family History  Problem Relation Age of Onset  . Diabetes type II     History  Substance Use Topics  . Smoking status: Former Research scientist (life sciences)  . Smokeless tobacco: Former Systems developer  . Alcohol Use: No   OB History   Grav Para Term Preterm Abortions TAB SAB Ect Mult Living                 Review of Systems  Unable to perform ROS: Dementia  Respiratory: Positive for shortness of breath.       Allergies  Review of patient's allergies indicates no known allergies.  Home Medications   Prior to Admission medications   Medication Sig Start Date End Date Taking? Authorizing Ahijah Devery  busPIRone (BUSPAR) 10 MG tablet Take 10 mg by mouth 2 (two) times daily.   Yes Historical Yassir Enis, MD  docusate  sodium (COLACE) 100 MG capsule Take 100 mg by mouth 2 (two) times daily.   Yes Historical Wanita Derenzo, MD  ferrous sulfate 325 (65 FE) MG EC tablet Take 325 mg by mouth 2 (two) times daily.   Yes Historical Daelan Gatt, MD  liver oil-zinc oxide (DESITIN) 40 % ointment Apply 1 application topically 2 (two) times daily. Apply to sacrum   Yes Historical Bri Wakeman, MD  Memantine HCl ER (NAMENDA XR) 28 MG CP24 Take 28 mg by mouth daily. For dementia   Yes Historical Floris Neuhaus, MD  nystatin (MYCOSTATIN) powder Apply topically 2 (two) times daily.   Yes Historical Jeslyn Amsler, MD  omeprazole (PRILOSEC) 20 MG capsule Take 20 mg by mouth every morning. For esophageal reflux   Yes Historical Kaileb Monsanto, MD  simvastatin (ZOCOR) 20 MG tablet Take 20 mg by mouth every evening. For hyperlipidemia   Yes Historical Emmalee Solivan, MD  traMADol (ULTRAM) 50 MG tablet Take 50 mg by mouth every 6 (six) hours as needed for moderate pain.   Yes Historical Lasundra Hascall, MD  vitamin E 200 UNIT capsule Take 200 Units by mouth every evening.    Yes Historical Kyandra Mcclaine, MD   BP 119/54  Pulse 68  Temp(Src) 97.9 F (36.6 C) (Oral)  Resp 20  Wt 163 lb 11.2 oz (74.254 kg)  SpO2 100% Physical Exam  Constitutional: She appears well-developed  and well-nourished. She appears distressed.  Morbidly obese, mildly increased work of breathing, speaking in short phrases  HENT:  Head: Normocephalic and atraumatic.  Mouth/Throat: Oropharynx is clear and moist. No oropharyngeal exudate.  Eyes: Conjunctivae and EOM are normal. Pupils are equal, round, and reactive to light.  Neck: Normal range of motion. Neck supple.  Cardiovascular: Normal rate, regular rhythm and normal heart sounds.   No murmur heard. Pulmonary/Chest: Breath sounds normal. She is in respiratory distress. She has no wheezes.  Lungs are clear without wheezing  Abdominal: Soft. There is no tenderness. There is no rebound and no guarding.  Genitourinary:  Brown stool without gross  blood. No hemorrhoids. Chaperone present  Musculoskeletal: Normal range of motion. She exhibits no edema and no tenderness.  No peripheral edema  Neurological: She is alert. No cranial nerve deficit. She exhibits normal muscle tone. Coordination normal.  Oriented x1, moving all extremities and following commands    ED Course  Procedures (including critical care time) Labs Review Labs Reviewed  COMPREHENSIVE METABOLIC PANEL - Abnormal; Notable for the following:    Sodium 121 (*)    Chloride 84 (*)    Glucose, Bld 143 (*)    Total Protein 8.6 (*)    Albumin 2.9 (*)    Total Bilirubin <0.2 (*)    GFR calc non Af Amer 61 (*)    GFR calc Af Amer 71 (*)    All other components within normal limits  PRO B NATRIURETIC PEPTIDE - Abnormal; Notable for the following:    Pro B Natriuretic peptide (BNP) 1029.0 (*)    All other components within normal limits  URINALYSIS, ROUTINE W REFLEX MICROSCOPIC - Abnormal; Notable for the following:    APPearance CLOUDY (*)    Hgb urine dipstick SMALL (*)    Leukocytes, UA LARGE (*)    All other components within normal limits  URINE MICROSCOPIC-ADD ON - Abnormal; Notable for the following:    Bacteria, UA FEW (*)    All other components within normal limits  CBC WITH DIFFERENTIAL - Abnormal; Notable for the following:    Hemoglobin 11.7 (*)    HCT 35.3 (*)    MCV 77.1 (*)    MCH 25.5 (*)    All other components within normal limits  MRSA PCR SCREENING  TROPONIN I  CBC WITH DIFFERENTIAL  POC OCCULT BLOOD, ED    Imaging Review Dg Chest Portable 1 View  09/24/2013   CLINICAL DATA:  Shortness of breath.  EXAM: PORTABLE CHEST - 1 VIEW  COMPARISON:  Chest radiograph performed 08/28/2011  FINDINGS: The lungs are hypoexpanded. Vascular congestion is noted. Mild bilateral airspace opacities may reflect atelectasis or possibly mild pneumonia. No definite pleural effusion or pneumothorax is seen.  The cardiomediastinal silhouette is borderline normal in  size. No acute osseous abnormalities are identified. Degenerative change is noted at the right glenohumeral joint, with chronic superior subluxation of both humeral heads, reflecting underlying chronic rotator cuff tears.  IMPRESSION: Lungs hypoexpanded. Vascular congestion seen. Mild bilateral airspace opacities may reflect atelectasis or possibly mild pneumonia, though difficult to fully assess given lung hypoexpansion.   Electronically Signed   By: Garald Balding M.D.   On: 09/24/2013 23:10     EKG Interpretation   Date/Time:  Saturday September 24 2013 21:56:25 EDT Ventricular Rate:  74 PR Interval:  247 QRS Duration: 154 QT Interval:  412 QTC Calculation: 457 R Axis:   -13 Text Interpretation:  Sinus rhythm Prolonged PR interval  Left bundle  branch block Non-specific ST-t changes Confirmed by Wyvonnia Dusky  MD, STEPHEN  [phone]) on 09/24/2013 10:06:13 PM      MDM   Final diagnoses:  Hyponatremia  Acute on chronic systolic congestive heart failure   Sudden onset SOB with chest tightness. EKG unchanged.  Tachypnea, no hypoxia, lungs clear.  CXR with vascular congestion. Echo 2012 with EF 40-45%.  Patient on no diuretics at home.  Sodium 121 likely due to volume overload.  Lasix given. Rocephin given for UTI. Hx PE but no tachycardia or hypoxia on exam today.  Dyspnea likely 2/2 volume overload. Admission d/w Dr. Arnoldo Morale.   Ezequiel Essex, MD 09/25/13 [phone]

## 2013-09-25 DIAGNOSIS — I5023 Acute on chronic systolic (congestive) heart failure: Secondary | ICD-10-CM | POA: Diagnosis present

## 2013-09-25 DIAGNOSIS — E871 Hypo-osmolality and hyponatremia: Secondary | ICD-10-CM | POA: Diagnosis present

## 2013-09-25 DIAGNOSIS — I5043 Acute on chronic combined systolic (congestive) and diastolic (congestive) heart failure: Secondary | ICD-10-CM

## 2013-09-25 DIAGNOSIS — Z833 Family history of diabetes mellitus: Secondary | ICD-10-CM | POA: Diagnosis not present

## 2013-09-25 DIAGNOSIS — Z6832 Body mass index (BMI) 32.0-32.9, adult: Secondary | ICD-10-CM | POA: Diagnosis not present

## 2013-09-25 DIAGNOSIS — T502X5A Adverse effect of carbonic-anhydrase inhibitors, benzothiadiazides and other diuretics, initial encounter: Secondary | ICD-10-CM | POA: Diagnosis present

## 2013-09-25 DIAGNOSIS — E1142 Type 2 diabetes mellitus with diabetic polyneuropathy: Secondary | ICD-10-CM | POA: Diagnosis present

## 2013-09-25 DIAGNOSIS — I10 Essential (primary) hypertension: Secondary | ICD-10-CM

## 2013-09-25 DIAGNOSIS — E1149 Type 2 diabetes mellitus with other diabetic neurological complication: Secondary | ICD-10-CM

## 2013-09-25 DIAGNOSIS — D509 Iron deficiency anemia, unspecified: Secondary | ICD-10-CM

## 2013-09-25 DIAGNOSIS — I509 Heart failure, unspecified: Secondary | ICD-10-CM

## 2013-09-25 DIAGNOSIS — M069 Rheumatoid arthritis, unspecified: Secondary | ICD-10-CM

## 2013-09-25 DIAGNOSIS — I5021 Acute systolic (congestive) heart failure: Secondary | ICD-10-CM | POA: Insufficient documentation

## 2013-09-25 DIAGNOSIS — N390 Urinary tract infection, site not specified: Secondary | ICD-10-CM

## 2013-09-25 DIAGNOSIS — Z993 Dependence on wheelchair: Secondary | ICD-10-CM | POA: Diagnosis not present

## 2013-09-25 DIAGNOSIS — J9600 Acute respiratory failure, unspecified whether with hypoxia or hypercapnia: Secondary | ICD-10-CM | POA: Diagnosis present

## 2013-09-25 DIAGNOSIS — F0390 Unspecified dementia without behavioral disturbance: Secondary | ICD-10-CM | POA: Diagnosis present

## 2013-09-25 DIAGNOSIS — E785 Hyperlipidemia, unspecified: Secondary | ICD-10-CM

## 2013-09-25 DIAGNOSIS — R0602 Shortness of breath: Secondary | ICD-10-CM | POA: Diagnosis present

## 2013-09-25 DIAGNOSIS — Z86711 Personal history of pulmonary embolism: Secondary | ICD-10-CM | POA: Diagnosis not present

## 2013-09-25 DIAGNOSIS — Z96649 Presence of unspecified artificial hip joint: Secondary | ICD-10-CM | POA: Diagnosis not present

## 2013-09-25 DIAGNOSIS — F411 Generalized anxiety disorder: Secondary | ICD-10-CM | POA: Diagnosis present

## 2013-09-25 DIAGNOSIS — Z87891 Personal history of nicotine dependence: Secondary | ICD-10-CM | POA: Diagnosis not present

## 2013-09-25 DIAGNOSIS — I959 Hypotension, unspecified: Secondary | ICD-10-CM | POA: Diagnosis not present

## 2013-09-25 DIAGNOSIS — K219 Gastro-esophageal reflux disease without esophagitis: Secondary | ICD-10-CM | POA: Diagnosis present

## 2013-09-25 DIAGNOSIS — Z8543 Personal history of malignant neoplasm of ovary: Secondary | ICD-10-CM | POA: Diagnosis not present

## 2013-09-25 DIAGNOSIS — Z7401 Bed confinement status: Secondary | ICD-10-CM | POA: Diagnosis not present

## 2013-09-25 DIAGNOSIS — E440 Moderate protein-calorie malnutrition: Secondary | ICD-10-CM | POA: Diagnosis present

## 2013-09-25 LAB — GLUCOSE, CAPILLARY
GLUCOSE-CAPILLARY: 153 mg/dL — AB (ref 70–99)
Glucose-Capillary: 169 mg/dL — ABNORMAL HIGH (ref 70–99)
Glucose-Capillary: 182 mg/dL — ABNORMAL HIGH (ref 70–99)

## 2013-09-25 LAB — MRSA PCR SCREENING: MRSA by PCR: NEGATIVE

## 2013-09-25 MED ORDER — DOCUSATE SODIUM 100 MG PO CAPS
100.0000 mg | ORAL_CAPSULE | Freq: Two times a day (BID) | ORAL | Status: DC
  Administered 2013-09-25 – 2013-09-30 (×6): 100 mg via ORAL
  Filled 2013-09-25 (×13): qty 1

## 2013-09-25 MED ORDER — FUROSEMIDE 20 MG PO TABS
20.0000 mg | ORAL_TABLET | Freq: Two times a day (BID) | ORAL | Status: DC
  Administered 2013-09-25: 20 mg via ORAL
  Filled 2013-09-25 (×3): qty 1

## 2013-09-25 MED ORDER — FUROSEMIDE 10 MG/ML IJ SOLN
20.0000 mg | Freq: Once | INTRAMUSCULAR | Status: AC
  Administered 2013-09-25: 20 mg via INTRAVENOUS
  Filled 2013-09-25: qty 2

## 2013-09-25 MED ORDER — VITAMIN E 45 MG (100 UNIT) PO CAPS
200.0000 [IU] | ORAL_CAPSULE | Freq: Every evening | ORAL | Status: DC
  Administered 2013-09-25 – 2013-09-29 (×5): 200 [IU] via ORAL
  Filled 2013-09-25 (×6): qty 2

## 2013-09-25 MED ORDER — SODIUM CHLORIDE 0.9 % IJ SOLN
3.0000 mL | Freq: Two times a day (BID) | INTRAMUSCULAR | Status: DC
  Administered 2013-09-25 – 2013-09-30 (×11): 3 mL via INTRAVENOUS

## 2013-09-25 MED ORDER — FERROUS SULFATE 325 (65 FE) MG PO TBEC
325.0000 mg | DELAYED_RELEASE_TABLET | Freq: Two times a day (BID) | ORAL | Status: DC
  Administered 2013-09-25 – 2013-09-30 (×11): 325 mg via ORAL
  Filled 2013-09-25 (×13): qty 1

## 2013-09-25 MED ORDER — SODIUM CHLORIDE 0.9 % IV SOLN: 250.0000 mL | INTRAVENOUS | Status: DC | PRN

## 2013-09-25 MED ORDER — INSULIN ASPART 100 UNIT/ML ~~LOC~~ SOLN: 0.0000 [IU] | Freq: Every day | SUBCUTANEOUS | Status: DC

## 2013-09-25 MED ORDER — FUROSEMIDE 10 MG/ML IJ SOLN
20.0000 mg | Freq: Two times a day (BID) | INTRAMUSCULAR | Status: DC
  Administered 2013-09-25: 20 mg via INTRAVENOUS
  Filled 2013-09-25 (×3): qty 2

## 2013-09-25 MED ORDER — BIOTENE DRY MOUTH MT LIQD
15.0000 mL | Freq: Two times a day (BID) | OROMUCOSAL | Status: DC
  Administered 2013-09-26 – 2013-09-30 (×8): 15 mL via OROMUCOSAL

## 2013-09-25 MED ORDER — SIMVASTATIN 20 MG PO TABS
20.0000 mg | ORAL_TABLET | Freq: Every evening | ORAL | Status: DC
  Administered 2013-09-25 – 2013-09-29 (×5): 20 mg via ORAL
  Filled 2013-09-25 (×6): qty 1

## 2013-09-25 MED ORDER — BUSPIRONE HCL 10 MG PO TABS
10.0000 mg | ORAL_TABLET | Freq: Two times a day (BID) | ORAL | Status: DC
  Administered 2013-09-25 – 2013-09-30 (×12): 10 mg via ORAL
  Filled 2013-09-25 (×13): qty 1

## 2013-09-25 MED ORDER — INSULIN ASPART 100 UNIT/ML ~~LOC~~ SOLN
0.0000 [IU] | Freq: Three times a day (TID) | SUBCUTANEOUS | Status: DC
  Administered 2013-09-25: 3 [IU] via SUBCUTANEOUS
  Administered 2013-09-26: 2 [IU] via SUBCUTANEOUS
  Administered 2013-09-26: 3 [IU] via SUBCUTANEOUS
  Administered 2013-09-26: 2 [IU] via SUBCUTANEOUS
  Administered 2013-09-27: 3 [IU] via SUBCUTANEOUS
  Administered 2013-09-27 – 2013-09-28 (×2): 2 [IU] via SUBCUTANEOUS
  Administered 2013-09-29: 3 [IU] via SUBCUTANEOUS

## 2013-09-25 MED ORDER — SODIUM CHLORIDE 0.9 % IJ SOLN: 3.0000 mL | INTRAMUSCULAR | Status: DC | PRN

## 2013-09-25 MED ORDER — PANTOPRAZOLE SODIUM 40 MG PO TBEC
40.0000 mg | DELAYED_RELEASE_TABLET | Freq: Every day | ORAL | Status: DC
  Administered 2013-09-25 – 2013-09-30 (×6): 40 mg via ORAL
  Filled 2013-09-25 (×7): qty 1

## 2013-09-25 MED ORDER — POTASSIUM CHLORIDE CRYS ER 10 MEQ PO TBCR
10.0000 meq | EXTENDED_RELEASE_TABLET | Freq: Every day | ORAL | Status: DC
  Administered 2013-09-25 – 2013-09-29 (×5): 10 meq via ORAL
  Filled 2013-09-25 (×5): qty 1

## 2013-09-25 MED ORDER — MEMANTINE HCL ER 28 MG PO CP24
28.0000 mg | ORAL_CAPSULE | Freq: Every day | ORAL | Status: DC
  Administered 2013-09-25 – 2013-09-30 (×6): 28 mg via ORAL
  Filled 2013-09-25 (×6): qty 28

## 2013-09-25 MED ORDER — DEXTROSE 5 % IV SOLN
1.0000 g | INTRAVENOUS | Status: DC
  Administered 2013-09-25 – 2013-09-29 (×5): 1 g via INTRAVENOUS
  Filled 2013-09-25 (×6): qty 10

## 2013-09-25 MED ORDER — LISINOPRIL 10 MG PO TABS
10.0000 mg | ORAL_TABLET | Freq: Every day | ORAL | Status: DC
  Administered 2013-09-25 – 2013-09-26 (×2): 10 mg via ORAL
  Filled 2013-09-25 (×3): qty 1

## 2013-09-25 MED ORDER — FUROSEMIDE 10 MG/ML IJ SOLN
20.0000 mg | Freq: Once | INTRAMUSCULAR | Status: AC
  Administered 2013-09-25: 20 mg via INTRAVENOUS

## 2013-09-25 MED ORDER — ENOXAPARIN SODIUM 30 MG/0.3ML ~~LOC~~ SOLN
30.0000 mg | SUBCUTANEOUS | Status: DC
  Administered 2013-09-25 – 2013-09-26 (×2): 30 mg via SUBCUTANEOUS
  Filled 2013-09-25 (×3): qty 0.3

## 2013-09-25 NOTE — Progress Notes (Signed)
MD notified that patient has history of diabetes.  CBGs ACHS and sliding scale insulin ordered.  Patient alert and oriented x1 (to self), disoriented x3 throughout shift.  Vital signs stable.  Patient incontinent during shift.  Bilateral feet remain in heel protectors.  Sacral dressing protocol started.  Patient denies any questions or concerns at this time.  Will continue to monitor.

## 2013-09-25 NOTE — H&P (Signed)
[facility] History and Physical  [name] SNK:[id number] DOB: [birth year] DOA: 09/24/2013  Referring physician:  PCP: [name], MD  Specialists:   Chief Complaint: SOB  HPI: [name] is a 80 y.o. female resident of the [facility] SNF with a history of Chronic systolic CHF, HTN, DM2, and Hyperlipidemia who was brought to the ED with complaints of sudden onset of SOB and orthopnea.    She denies having any chest pain or fevers or chills. She reports having chest tightness.  In the ED she was found to have a Pro BNP of 1029.0 and a chest X-Ray with Pulmonary Vascular congestion, and she was also found to have Hyponatremia with a Sodium level of 121.     Review of Systems:  Constitutional: No Weight Loss, No Weight Gain, Night Sweats, Fevers, Chills, Fatigue, or Generalized Weakness HEENT: No Headaches, Difficulty Swallowing,Tooth/Dental Problems,Sore Throat,  No Sneezing, Rhinitis, Ear Ache, Nasal Congestion, or Post Nasal Drip,  Cardio-vascular:  No Chest pain, +Orthopnea, PND, +Edema in lower extremities, Anasarca, Dizziness, Palpitations  Resp: +Dyspnea, No DOE, No Cough, No Hemoptysis, No Wheezing.    GI: No Heartburn, Indigestion, Abdominal Pain, Nausea, Vomiting, Diarrhea, Change in Bowel Habits,  Loss of Appetite  GU: No Dysuria, Change in Color of Urine, No Urgency or Frequency.  No flank pain.  Musculoskeletal: No Joint Pain or Swelling.  No Decreased Range of Motion. No Back Pain.  Neurologic: No Syncope, No Seizures, Muscle Weakness, Paresthesia, Vision Disturbance or Loss, No Diplopia, No Vertigo, +Difficulty Walking,  Skin: No Rash or Lesions. Psych: No Change in Mood or Affect. No Depression or Anxiety. No Memory loss. No Confusion or Hallucinations   Past Medical History  Diagnosis Date  . PE (pulmonary embolism) 2011  . Ovarian cancer   . Hyperlipidemia   . Rheumatoid arthritis(714.0)   . GERD (gastroesophageal reflux disease)   . Hypertension   .  Anxiety   . Diabetes mellitus   . Diabetes mellitus   . Hx of ovarian cancer 06/21/2011      Past Surgical History  Procedure Laterality Date  . Total hip arthroplasty         Prior to Admission medications   Medication Sig Start Date End Date Taking? Authorizing Provider  busPIRone (BUSPAR) 10 MG tablet Take 10 mg by mouth 2 (two) times daily.   Yes Historical Provider, MD  docusate sodium (COLACE) 100 MG capsule Take 100 mg by mouth 2 (two) times daily.   Yes Historical Provider, MD  ferrous sulfate 325 (65 FE) MG EC tablet Take 325 mg by mouth 2 (two) times daily.   Yes Historical Provider, MD  liver oil-zinc oxide (DESITIN) 40 % ointment Apply 1 application topically 2 (two) times daily. Apply to sacrum   Yes Historical Provider, MD  Memantine HCl ER (NAMENDA XR) 28 MG CP24 Take 28 mg by mouth daily. For dementia   Yes Historical Provider, MD  nystatin (MYCOSTATIN) powder Apply topically 2 (two) times daily.   Yes Historical Provider, MD  omeprazole (PRILOSEC) 20 MG capsule Take 20 mg by mouth every morning. For esophageal reflux   Yes Historical Provider, MD  simvastatin (ZOCOR) 20 MG tablet Take 20 mg by mouth every evening. For hyperlipidemia   Yes Historical Provider, MD  traMADol (ULTRAM) 50 MG tablet Take 50 mg by mouth every 6 (six) hours as needed for moderate pain.   Yes Historical Provider, MD  vitamin E 200 UNIT capsule Take 200 Units by mouth  every evening.    Yes Historical Provider, MD    No Known Allergies   Social History:  Resident of [facility],   Wheelchair Bound.     reports that she has quit smoking. She has quit using smokeless tobacco. She reports that she does not drink alcohol or use illicit drugs.     Family History  Problem Relation Age of Onset  . Diabetes type II         Physical Exam:  GEN:  Pleasant Elderly Thin 80 y.o. [place] American female examined and in no acute distress; cooperative with exam Filed Vitals:   09/25/13 0000  09/25/13 0015 09/25/13 0030 09/25/13 0100  BP: 114/61 117/61 124/91 119/54  Pulse: 64 74 73 68  Temp:    97.9 F (36.6 C)  TempSrc:    Oral  Resp: 19 19 28 20   Weight:    74.254 kg (163 lb 11.2 oz)  SpO2: 100% 100% 100% 100%   Blood pressure 119/54, pulse 68, temperature 97.9 F (36.6 C), temperature source Oral, resp. rate 20, weight 74.254 kg (163 lb 11.2 oz), SpO2 100.00%. PSYCH: She is alert and oriented x3; does not appear anxious does not appear depressed; affect is normal HEENT: Normocephalic and Atraumatic, Mucous membranes pink; PERRLA; EOM intact; Fundi:  Benign;  No scleral icterus, Nares: Patent, Oropharynx: Clear,Poor sparse Dentition, Neck:  FROM, no cervical lymphadenopathy nor thyromegaly or carotid bruit; no JVD; Breasts:: Not examined CHEST WALL: No tenderness CHEST:  Decreased Breath sound and Bibasilar Rales Present,  No Rhonchi, No Wheezes.   HEART: Regular rate and rhythm; no murmurs rubs or gallops BACK: No kyphosis or scoliosis; no CVA tenderness ABDOMEN: Positive Bowel Sounds, soft non-tender; no masses, no organomegaly. Rectal Exam: Not done EXTREMITIES: No cyanosis, clubbing,  2+EDEMA BLEs;   No ulcerations. Genitalia: not examined PULSES: 2+ and symmetric SKIN: Normal hydration no rash or ulceration CNS:  Alert and oriented x 3, Weakness in Both Lower Legs,  But No focal Deficits Vascular: pulses palpable throughout    Labs on Admission:  Basic Metabolic Panel:  Recent Labs Lab 09/24/13 2206  NA 121*  K 5.1  CL 84*  CO2 22  GLUCOSE 143*  BUN 17  CREATININE 0.83  CALCIUM 9.7   Liver Function Tests:  Recent Labs Lab 09/24/13 2206  AST 14  ALT 6  ALKPHOS 81  BILITOT <0.2*  PROT 8.6*  ALBUMIN 2.9*   No results found for this basename: LIPASE, AMYLASE,  in the last 168 hours No results found for this basename: AMMONIA,  in the last 168 hours CBC:  Recent Labs Lab 09/24/13 2337  WBC 5.5  NEUTROABS 3.4  HGB 11.7*  HCT 35.3*  MCV  77.1*  PLT 193   Cardiac Enzymes:  Recent Labs Lab 09/24/13 2250  TROPONINI <0.30    BNP (last 3 results)  Recent Labs  09/24/13 2250  PROBNP 1029.0*   CBG: No results found for this basename: GLUCAP,  in the last 168 hours  Radiological Exams on Admission: Dg Chest Portable 1 View  09/24/2013   CLINICAL DATA:  Shortness of breath.  EXAM: PORTABLE CHEST - 1 VIEW  COMPARISON:  Chest radiograph performed 08/28/2011  FINDINGS: The lungs are hypoexpanded. Vascular congestion is noted. Mild bilateral airspace opacities may reflect atelectasis or possibly mild pneumonia. No definite pleural effusion or pneumothorax is seen.  The cardiomediastinal silhouette is borderline normal in size. No acute osseous abnormalities are identified. Degenerative change is noted at  the right glenohumeral joint, with chronic superior subluxation of both humeral heads, reflecting underlying chronic rotator cuff tears.  IMPRESSION: Lungs hypoexpanded. Vascular congestion seen. Mild bilateral airspace opacities may reflect atelectasis or possibly mild pneumonia, though difficult to fully assess given lung hypoexpansion.   Electronically Signed   By: Garald Balding M.D.   On: 09/24/2013 23:10      EKG: Independently reviewed.     Assessment/Plan:   80 y.o. female with  Principal Problem:   Hyponatremia Active Problems:   Acute on chronic systolic congestive heart failure   Urinary tract infection   Hyperlipidemia   Rheumatoid arthritis(714.0)   Hypertension   DM (diabetes mellitus), type 2 with neurological complications   Anemia     1.   Acute on Chronic CHF-  Placed on the Acute CHF Protocol, to Diurese with Lasix 20 mg IV q 12 hrs X 4 doses.   2 D ECHO in AM.     2.   Hyponatremia-   Due to Volume Overload,   Given IV Lasix, Monitor Sodium levels carefully.       3.   UTI- Urine C+S sent, placed on IV Rocephin .     4.   HTN-  Currently Normotensive,  And is on No HTN medications, Monitor  BPs.    5.   Hyperlipidemia- on Statin Rx.    6.  Iron Deficiency Anemia-  contniue Iron Rx.    7.   DM2-  Diet Controlled ,  Monitor glucose levels and check HbA1C.    8.   RA-  On Tramadol for pain.     9.  DVT Prophylaxis with Lovenox.       Code Status:  FULL CODE   Family Communication:   No Family Present  Disposition Plan:    Inpatient   Time spent:  [place] [facility] Pager [phone]  If 7PM-7AM, please contact night-coverage [email or web address] Password [email or web address] 09/25/2013, 1:32 AM

## 2013-09-25 NOTE — Progress Notes (Signed)
[facility] PROGRESS NOTE  Claudia HERDT RCV:[id number] DOB: 29-Jul-1923 DOA: 09/24/2013 PCP: [name], MD  Assessment/Plan: 1. Acute on chronic combined systolic and diastolic congestive heart failure -Transthoracic echocardiogram performed on 03/05/2011 showed an ejection fraction of 45% with grade 1 diastolic dysfunction -Initial EKG reveals sinus rhythm with the presence of a left elbow branch block that had been noted on previous EKGs -Continue IV Lasix 20 mg twice a day -Monitor ins and outs -Ordered 2D echo  2.  Hyponatremia -Likely secondary to hypervolemic state from CHF, as she presents with a sodium of 121 -Administration IV Lasix -Repeat lab work in a.m.  3.  Urinary tract infection.  -Initial urinalysis showing the presence of leukocytes with a few bacteria -Patient started on empiric IV antibiotic therapy with ceftriaxone  4.   Acute respiratory failure -Evidence by a respiratory rate of 28 and a new oxygen requirement -Secondary to CHF, improved after the administration of diuretic therapy  5.  Type 2 diabetes mellitus -Perform Accu-Cheks q. a.c. and each bedtime with sliding scale coverage  6.  Hypertension -Blood pressure stable, continue lisinopril 10 mg by mouth daily  Code Status: Full Family Communication:  Disposition Plan: Continue IV diuresis    Antibiotics:  Ceftriaxone  HPI/Subjective: Patient is a pleasant 80 year old female, nursing home resident who was transferred from her skilled nursing facility to the emergency department overnight with complaints of shortness of breath. Initial workup included a chest x-ray which showed pulmonary vascular congestion. Radiology reporting mild bilateralopacities which could reflect atelectasis or mild pneumonia however difficult to fully assess given hypoexpansion. Her labs revealed the presence of hyponatremia having a sodium of 121. Back in August of 2014 she had a normal sodium of 137. Symptoms  felt to be secondary to acute decompensated congestive heart failure that likely lead to hyponatremia from volume overload state. She was administered IV Lasix admitted to telemetry. This morning she reports feeling a little better however has ongoing dyspnea.   Objective: Filed Vitals:   09/25/13 1137  BP: 110/64  Pulse: 79  Temp:   Resp: 18    Intake/Output Summary (Last 24 hours) at 09/25/13 1150 Last data filed at 09/25/13 1143  Gross per 24 hour  Intake    363 ml  Output    215 ml  Net    148 ml   Filed Weights   09/25/13 0100  Weight: 74.254 kg (163 lb 11.2 oz)    Exam:   General:  Patient is in no acute distress, states feeling a little better  Cardiovascular: Regular rate and rhythm, 2/6 SEM  Respiratory: Positive bilateral crackles, normal inspiratory effort  Abdomen: Soft nontender nondistended  Musculoskeletal: Positive edema  Data Reviewed: Basic Metabolic Panel:  Recent Labs Lab 09/24/13 2206  NA 121*  K 5.1  CL 84*  CO2 22  GLUCOSE 143*  BUN 17  CREATININE 0.83  CALCIUM 9.7   Liver Function Tests:  Recent Labs Lab 09/24/13 2206  AST 14  ALT 6  ALKPHOS 81  BILITOT <0.2*  PROT 8.6*  ALBUMIN 2.9*   No results found for this basename: LIPASE, AMYLASE,  in the last 168 hours No results found for this basename: AMMONIA,  in the last 168 hours CBC:  Recent Labs Lab 09/24/13 2337  WBC 5.5  NEUTROABS 3.4  HGB 11.7*  HCT 35.3*  MCV 77.1*  PLT 193   Cardiac Enzymes:  Recent Labs Lab 09/24/13 2250  TROPONINI <0.30   BNP (last 3  results)  Recent Labs  09/24/13 2250  PROBNP 1029.0*   CBG: No results found for this basename: GLUCAP,  in the last 168 hours  Recent Results (from the past 240 hour(s))  MRSA PCR SCREENING     Status: None   Collection Time    09/25/13  1:26 AM      Result Value Ref Range Status   MRSA by PCR NEGATIVE  NEGATIVE Final   Comment:            The GeneXpert MRSA Assay (FDA     approved for  NASAL specimens     only), is one component of a     comprehensive MRSA colonization     surveillance program. It is not     intended to diagnose MRSA     infection nor to guide or     monitor treatment for     MRSA infections.     Studies: Dg Chest Portable 1 View  09/24/2013   CLINICAL DATA:  Shortness of breath.  EXAM: PORTABLE CHEST - 1 VIEW  COMPARISON:  Chest radiograph performed 08/28/2011  FINDINGS: The lungs are hypoexpanded. Vascular congestion is noted. Mild bilateral airspace opacities may reflect atelectasis or possibly mild pneumonia. No definite pleural effusion or pneumothorax is seen.  The cardiomediastinal silhouette is borderline normal in size. No acute osseous abnormalities are identified. Degenerative change is noted at the right glenohumeral joint, with chronic superior subluxation of both humeral heads, reflecting underlying chronic rotator cuff tears.  IMPRESSION: Lungs hypoexpanded. Vascular congestion seen. Mild bilateral airspace opacities may reflect atelectasis or possibly mild pneumonia, though difficult to fully assess given lung hypoexpansion.   Electronically Signed   By: Garald Balding M.D.   On: 09/24/2013 23:10    Scheduled Meds: . busPIRone  10 mg Oral BID  . cefTRIAXone (ROCEPHIN)  IV  1 g Intravenous Q24H  . docusate sodium  100 mg Oral BID  . enoxaparin (LOVENOX) injection  30 mg Subcutaneous Q24H  . ferrous sulfate  325 mg Oral BID WC  . furosemide  20 mg Intravenous BID  . furosemide  20 mg Intravenous Once  . insulin aspart  0-15 Units Subcutaneous TID WC  . insulin aspart  0-5 Units Subcutaneous QHS  . lisinopril  10 mg Oral Daily  . Memantine HCl ER  28 mg Oral Daily  . pantoprazole  40 mg Oral Daily  . potassium chloride  10 mEq Oral Daily  . simvastatin  20 mg Oral QPM  . sodium chloride  3 mL Intravenous Q12H  . vitamin E  200 Units Oral QPM   Continuous Infusions:   Principal Problem:   Hyponatremia Active Problems:   Urinary  tract infection   Hyperlipidemia   Rheumatoid arthritis(714.0)   Hypertension   DM (diabetes mellitus), type 2 with neurological complications   Anemia   Acute on chronic systolic congestive heart failure    Time spent: 30 min    Kelvin Cellar  [facility] Pager [phone]. If 7PM-7AM, please contact night-coverage at [email or web address], password [facility] 09/25/2013, 11:50 AM  LOS: 1 day

## 2013-09-26 ENCOUNTER — Inpatient Hospital Stay (HOSPITAL_COMMUNITY): Payer: PRIVATE HEALTH INSURANCE

## 2013-09-26 DIAGNOSIS — F0391 Unspecified dementia with behavioral disturbance: Secondary | ICD-10-CM

## 2013-09-26 DIAGNOSIS — F03918 Unspecified dementia, unspecified severity, with other behavioral disturbance: Secondary | ICD-10-CM

## 2013-09-26 DIAGNOSIS — I369 Nonrheumatic tricuspid valve disorder, unspecified: Secondary | ICD-10-CM

## 2013-09-26 LAB — GLUCOSE, CAPILLARY
GLUCOSE-CAPILLARY: 139 mg/dL — AB (ref 70–99)
GLUCOSE-CAPILLARY: 145 mg/dL — AB (ref 70–99)
GLUCOSE-CAPILLARY: 164 mg/dL — AB (ref 70–99)
Glucose-Capillary: 154 mg/dL — ABNORMAL HIGH (ref 70–99)

## 2013-09-26 LAB — BASIC METABOLIC PANEL
BUN: 18 mg/dL (ref 6–23)
CO2: 21 meq/L (ref 19–32)
Calcium: 9.1 mg/dL (ref 8.4–10.5)
Chloride: 84 mEq/L — ABNORMAL LOW (ref 96–112)
Creatinine, Ser: 0.89 mg/dL (ref 0.50–1.10)
GFR calc Af Amer: 65 mL/min — ABNORMAL LOW (ref >90)
GFR, EST NON AFRICAN AMERICAN: 56 mL/min — AB (ref >90)
GLUCOSE: 126 mg/dL — AB (ref 70–99)
POTASSIUM: 4.3 meq/L (ref 3.7–5.3)
SODIUM: 120 meq/L — AB (ref 137–147)

## 2013-09-26 LAB — TROPONIN I: Troponin I: <0.3 ng/mL (ref <0.30)

## 2013-09-26 MED ORDER — ADULT MULTIVITAMIN W/MINERALS CH
1.0000 | ORAL_TABLET | Freq: Every day | ORAL | Status: DC
  Administered 2013-09-26 – 2013-09-30 (×5): 1 via ORAL
  Filled 2013-09-26 (×5): qty 1

## 2013-09-26 MED ORDER — GLUCERNA SHAKE PO LIQD: 237.0000 mL | Freq: Every day | ORAL | Status: DC | PRN

## 2013-09-26 MED ORDER — FUROSEMIDE 20 MG PO TABS
20.0000 mg | ORAL_TABLET | Freq: Two times a day (BID) | ORAL | Status: DC
  Administered 2013-09-26 (×2): 20 mg via ORAL
  Filled 2013-09-26 (×5): qty 1

## 2013-09-26 MED ORDER — ENOXAPARIN SODIUM 40 MG/0.4ML ~~LOC~~ SOLN
40.0000 mg | SUBCUTANEOUS | Status: DC
  Administered 2013-09-27 – 2013-09-30 (×3): 40 mg via SUBCUTANEOUS
  Filled 2013-09-26 (×5): qty 0.4

## 2013-09-26 MED ORDER — ACETAMINOPHEN 325 MG PO TABS: 650.0000 mg | ORAL_TABLET | Freq: Four times a day (QID) | ORAL | Status: DC | PRN

## 2013-09-26 NOTE — Progress Notes (Signed)
Na level 120, no s/s, MD aware, will continue to monitor, Thanks Arvella Nigh RN

## 2013-09-26 NOTE — Progress Notes (Signed)
Echocardiogram 2D Echocardiogram has been performed.  [name] 09/26/2013, 10:19 AM

## 2013-09-26 NOTE — Progress Notes (Signed)
Utilization review completed. Bertha Stanfill, RN, BSN. 

## 2013-09-26 NOTE — Progress Notes (Signed)
INITIAL NUTRITION ASSESSMENT  DOCUMENTATION CODES Per approved criteria  -Non-severe (moderate) malnutrition in the context of chronic illness  Pt meets criteria for Moderate MALNUTRITION in the context of Chronic Illness as evidenced by 10% weight loss in less than 4 months, estimated intake < 75% of estimated energy needs for > 1 month, and mild to moderate muscle wasting per physical exam  INTERVENTION: Provide Glucerna Shake once daily prn Provide Multivitamin with minerals daily Encourage PO intake  NUTRITION DIAGNOSIS: Inadequate oral intake related to decreased appetite evidenced by 10% weight loss in less than 4 months.   Goal: Pt to meet >/= 90% of their estimated nutrition needs   Monitor:  PO intake, weight trend, labs  Reason for Assessment: Malnutrition Screening Tool, score of 3  80 y.o. female  Admitting Dx: Hyponatremia  ASSESSMENT: 80 y.o. female resident of the [facility] with a history of Chronic systolic CHF, HTN, DM2, and Hyperlipidemia who was brought to the ED with complaints of sudden onset of SOB and orthopnea. In the ED she was found to have a chest X-Ray with Pulmonary Vascular congestion, and Hyponatremia with a Sodium level of 121.   Weight history shows that pt has lost 10% of her body weight within the past 4 months. Pt reports that she noticed a decrease in her appetite and she reports eating about 50% less than usual for the past couple months. Pt states that she used to weigh 280 lbs and has gradually lost weight over several years due to aging and eating less. Pt states she ate most of her meal today. Per nursing notes pt ate 75% of breakfast today.    Nutrition Focused Physical Exam:  Subcutaneous Fat:  Orbital Region: mild wasting Upper Arm Region: mild wasting Thoracic and Lumbar Region: NA  Muscle:  Temple Region: moderate wasting Clavicle Bone Region: mild wasting Clavicle and Acromion Bone Region: wnl Scapular Bone Region:  NA Dorsal Hand: wnl Patellar Region: wnl Anterior Thigh Region: wnl Posterior Calf Region: wnl  Edema: +1 RLE and LLE edema   Height: Ht Readings from Last 1 Encounters:  09/25/13 5' (1.524 m)    Weight: Wt Readings from Last 1 Encounters:  09/26/13 163 lb 5.8 oz (74.1 kg)    Ideal Body Weight: 100 lbs  % Ideal Body Weight: 163%  Wt Readings from Last 10 Encounters:  09/26/13 163 lb 5.8 oz (74.1 kg)  09/09/13 172 lb (78.019 kg)  08/12/13 169 lb (76.658 kg)  07/22/13 165 lb (74.844 kg)  06/14/13 181 lb (82.101 kg)  06/21/11 182 lb 5.1 oz (82.7 kg)  05/22/11 190 lb (86.183 kg)    Usual Body Weight: 180 lbs  % Usual Body Weight: 91%  BMI:  Body mass index is 31.9 kg/(m^2). Obese  Estimated Nutritional Needs: Kcal: 1600-1800 Protein: 70-80 grams Fluid: 1.6 L/day  Skin: +1 RLE and LLE edema; stage 1 pressure ulcer of buttocks  Diet Order: Cardiac  EDUCATION NEEDS: -No education needs identified at this time   Intake/Output Summary (Last 24 hours) at 09/26/13 1454 Last data filed at 09/26/13 0836  Gross per 24 hour  Intake    410 ml  Output      0 ml  Net    410 ml    Last BM: 6/21  Labs:   Recent Labs Lab 09/24/13 2206 09/26/13 0253  NA 121* 120*  K 5.1 4.3  CL 84* 84*  CO2 22 21  BUN 17 18  CREATININE 0.83 0.89  CALCIUM 9.7 9.1  GLUCOSE 143* 126*    CBG (last 3)   Recent Labs  09/25/13 2050 09/26/13 0607 09/26/13 1110  GLUCAP 153* 139* 145*    Scheduled Meds: . antiseptic oral rinse  15 mL Mouth Rinse BID  . busPIRone  10 mg Oral BID  . cefTRIAXone (ROCEPHIN)  IV  1 g Intravenous Q24H  . docusate sodium  100 mg Oral BID  . [START ON 09/27/2013] enoxaparin (LOVENOX) injection  40 mg Subcutaneous Q24H  . ferrous sulfate  325 mg Oral BID WC  . furosemide  20 mg Oral BID  . insulin aspart  0-15 Units Subcutaneous TID WC  . insulin aspart  0-5 Units Subcutaneous QHS  . lisinopril  10 mg Oral Daily  . Memantine HCl ER  28 mg  Oral Daily  . pantoprazole  40 mg Oral Daily  . potassium chloride  10 mEq Oral Daily  . simvastatin  20 mg Oral QPM  . sodium chloride  3 mL Intravenous Q12H  . vitamin E  200 Units Oral QPM    Continuous Infusions:   Past Medical History  Diagnosis Date  . PE (pulmonary embolism) 2011  . Ovarian cancer   . Hyperlipidemia   . Rheumatoid arthritis(714.0)   . GERD (gastroesophageal reflux disease)   . Hypertension   . Anxiety   . Diabetes mellitus   . Diabetes mellitus   . Hx of ovarian cancer 06/21/2011    Past Surgical History  Procedure Laterality Date  . Total hip arthroplasty      Pryor Ochoa RD, LDN Inpatient Clinical Dietitian Pager: [phone] After Hours Pager: [phone]

## 2013-09-26 NOTE — Plan of Care (Signed)
Problem: Phase I Progression Outcomes Goal: EF % per last Echo/documented,Core Reminder form on chart Outcome: Progressing EF 45-50% per echo on 03/05/11, New echo performed on 6/22

## 2013-09-26 NOTE — Progress Notes (Signed)
80 year old female- resident of [place].  CSW Psychosocial Assessment to follow tomorrow. Will plan return to [facility] if this is plan of patient and family. CSW met briefly with patient- she was noted to be alert and at times oriented but other times quite confused. She was unable to participate in interview with any clarity and was constantly trying to take her telemetry cords off . CSW will talk to patient's family in a.m and complete the assessment  Butch Penny T. [place], [place]

## 2013-09-26 NOTE — Progress Notes (Signed)
[facility] PROGRESS NOTE  [name] WCB:[id number] DOB: July 23, 1923 DOA: 09/24/2013 PCP: [name], MD  Assessment/Plan: 1. Acute on chronic combined systolic and diastolic congestive heart failure -Transthoracic echocardiogram performed on 03/05/2011 showed an ejection fraction of 45% with grade 1 diastolic dysfunction -Initial EKG reveals sinus rhythm with the presence of a left elbow branch block that had been noted on previous EKGs -Patient thinks she may be feeling a little better, wt this morning at 74.1 kg, 78 kg on 09/09/2013.   -Pending echo -Will transition to oral lasix at 20 mg PO BID  2.  Hyponatremia -Likely secondary to hypervolemic state from CHF, as she presents with a sodium of 120 -Repeat lab work in a.m.  3.  Urinary tract infection.  -Initial urinalysis showing the presence of leukocytes with a few bacteria -Patient started on empiric IV antibiotic therapy with ceftriaxone  4.   Acute respiratory failure -Evidence by a respiratory rate of 28 and a new oxygen requirement -Secondary to CHF, improved after the administration of diuretic therapy  5.  Type 2 diabetes mellitus -Perform Accu-Cheks q. a.c. and each bedtime with sliding scale coverage  6.  Hypertension -Blood pressure stable, continue lisinopril 10 mg by mouth daily  Code Status: Full Family Communication:  Disposition Plan: Continue diuresis    Antibiotics:  Ceftriaxone  HPI/Subjective: Patient is a pleasant 80 year old female, nursing home resident who was transferred from her skilled nursing facility to the emergency department overnight with complaints of shortness of breath. Initial workup included a chest x-ray which showed pulmonary vascular congestion. Radiology reporting mild bilateralopacities which could reflect atelectasis or mild pneumonia however difficult to fully assess given hypoexpansion. Her labs revealed the presence of hyponatremia having a sodium of 121. Back in  August of 2014 she had a normal sodium of 137. Symptoms felt to be secondary to acute decompensated congestive heart failure that likely lead to hyponatremia from volume overload state. She was administered IV Lasix admitted to telemetry.  This morning she reports feeling a little better, she is awake and alert, thinks she is feeling better, tolerating PO intake  Objective: Filed Vitals:   09/26/13 0950  BP: 121/60  Pulse: 83  Temp:   Resp:     Intake/Output Summary (Last 24 hours) at 09/26/13 1144 Last data filed at 09/26/13 0836  Gross per 24 hour  Intake    410 ml  Output      0 ml  Net    410 ml   Filed Weights   09/25/13 0100 09/26/13 0505  Weight: 74.254 kg (163 lb 11.2 oz) 74.1 kg (163 lb 5.8 oz)    Exam:   General:  Patient is in no acute distress, states feeling a little better  Cardiovascular: Regular rate and rhythm, 2/6 SEM  Respiratory: Positive bilateral crackles, normal inspiratory effort  Abdomen: Soft nontender nondistended  Musculoskeletal: Positive edema  Data Reviewed: Basic Metabolic Panel:  Recent Labs Lab 09/24/13 2206 09/26/13 0253  NA 121* 120*  K 5.1 4.3  CL 84* 84*  CO2 22 21  GLUCOSE 143* 126*  BUN 17 18  CREATININE 0.83 0.89  CALCIUM 9.7 9.1   Liver Function Tests:  Recent Labs Lab 09/24/13 2206  AST 14  ALT 6  ALKPHOS 81  BILITOT <0.2*  PROT 8.6*  ALBUMIN 2.9*   No results found for this basename: LIPASE, AMYLASE,  in the last 168 hours No results found for this basename: AMMONIA,  in the last 168 hours  CBC:  Recent Labs Lab 09/24/13 2337  WBC 5.5  NEUTROABS 3.4  HGB 11.7*  HCT 35.3*  MCV 77.1*  PLT 193   Cardiac Enzymes:  Recent Labs Lab 09/24/13 2250  TROPONINI <0.30   BNP (last 3 results)  Recent Labs  09/24/13 2250  PROBNP 1029.0*   CBG:  Recent Labs Lab 09/25/13 1150 09/25/13 1641 09/25/13 2050 09/26/13 0607 09/26/13 1110  GLUCAP 169* 182* 153* 139* 145*    Recent Results (from  the past 240 hour(s))  MRSA PCR SCREENING     Status: None   Collection Time    09/25/13  1:26 AM      Result Value Ref Range Status   MRSA by PCR NEGATIVE  NEGATIVE Final   Comment:            The GeneXpert MRSA Assay (FDA     approved for NASAL specimens     only), is one component of a     comprehensive MRSA colonization     surveillance program. It is not     intended to diagnose MRSA     infection nor to guide or     monitor treatment for     MRSA infections.     Studies: Dg Chest Port 1 View  09/26/2013   CLINICAL DATA:  Follow-up CHF  EXAM: PORTABLE CHEST - 1 VIEW  COMPARISON:  09/24/2013  FINDINGS: Cardiac shadow is stable. The overall inspiratory effort is poor but stable. Degenerative changes are noted above shoulder joints bilaterally. Diffuse interstitial changes as well as vascular congestion are seen  IMPRESSION: Stable vascular congestion   Electronically Signed   By: Celese Catalina M.D.   On: 09/26/2013 10:11   Dg Chest Portable 1 View  09/24/2013   CLINICAL DATA:  Shortness of breath.  EXAM: PORTABLE CHEST - 1 VIEW  COMPARISON:  Chest radiograph performed 08/28/2011  FINDINGS: The lungs are hypoexpanded. Vascular congestion is noted. Mild bilateral airspace opacities may reflect atelectasis or possibly mild pneumonia. No definite pleural effusion or pneumothorax is seen.  The cardiomediastinal silhouette is borderline normal in size. No acute osseous abnormalities are identified. Degenerative change is noted at the right glenohumeral joint, with chronic superior subluxation of both humeral heads, reflecting underlying chronic rotator cuff tears.  IMPRESSION: Lungs hypoexpanded. Vascular congestion seen. Mild bilateral airspace opacities may reflect atelectasis or possibly mild pneumonia, though difficult to fully assess given lung hypoexpansion.   Electronically Signed   By: Garald Balding M.D.   On: 09/24/2013 23:10    Scheduled Meds: . antiseptic oral rinse  15 mL Mouth  Rinse BID  . busPIRone  10 mg Oral BID  . cefTRIAXone (ROCEPHIN)  IV  1 g Intravenous Q24H  . docusate sodium  100 mg Oral BID  . [START ON 09/27/2013] enoxaparin (LOVENOX) injection  40 mg Subcutaneous Q24H  . ferrous sulfate  325 mg Oral BID WC  . furosemide  20 mg Oral BID  . insulin aspart  0-15 Units Subcutaneous TID WC  . insulin aspart  0-5 Units Subcutaneous QHS  . lisinopril  10 mg Oral Daily  . Memantine HCl ER  28 mg Oral Daily  . pantoprazole  40 mg Oral Daily  . potassium chloride  10 mEq Oral Daily  . simvastatin  20 mg Oral QPM  . sodium chloride  3 mL Intravenous Q12H  . vitamin E  200 Units Oral QPM   Continuous Infusions:   Principal Problem:   Hyponatremia  Active Problems:   Urinary tract infection   Hyperlipidemia   Rheumatoid arthritis(714.0)   Hypertension   DM (diabetes mellitus), type 2 with neurological complications   Anemia   Acute on chronic systolic congestive heart failure    Time spent: 30 min    Kelvin Cellar  [facility] Pager [phone]. If 7PM-7AM, please contact night-coverage at [email or web address], password [facility] 09/26/2013, 11:44 AM  LOS: 2 days

## 2013-09-26 NOTE — Progress Notes (Signed)
Patient complained of chest pain. MD notified. No signs of distress or discomfort noted. MD notified. New orders given. Will continue to monitor patient for further changes in condition.

## 2013-09-26 NOTE — Progress Notes (Signed)
Report given to receiving RN. Patient in bed resting. No verbal complaints and no signs or symptoms of distress or discomfort noted.  

## 2013-09-26 NOTE — Progress Notes (Signed)
Pt BP 88/46 pt asymptomatic. MD paged via [facility] text page. One hr re-check 96/50, K Schorr ordered to continue to monitor BP overnight.

## 2013-09-27 LAB — BASIC METABOLIC PANEL
BUN: 26 mg/dL — ABNORMAL HIGH (ref 6–23)
CALCIUM: 9 mg/dL (ref 8.4–10.5)
CO2: 21 mEq/L (ref 19–32)
Chloride: 81 mEq/L — ABNORMAL LOW (ref 96–112)
Creatinine, Ser: 1.15 mg/dL — ABNORMAL HIGH (ref 0.50–1.10)
GFR calc Af Amer: 48 mL/min — ABNORMAL LOW (ref >90)
GFR, EST NON AFRICAN AMERICAN: 41 mL/min — AB (ref >90)
Glucose, Bld: 112 mg/dL — ABNORMAL HIGH (ref 70–99)
Potassium: 4.8 mEq/L (ref 3.7–5.3)
SODIUM: 117 meq/L — AB (ref 137–147)

## 2013-09-27 LAB — CBC
HCT: 32.4 % — ABNORMAL LOW (ref 36.0–46.0)
Hemoglobin: 10.8 g/dL — ABNORMAL LOW (ref 12.0–15.0)
MCH: 25.2 pg — ABNORMAL LOW (ref 26.0–34.0)
MCHC: 33.3 g/dL (ref 30.0–36.0)
MCV: 75.5 fL — ABNORMAL LOW (ref 78.0–100.0)
Platelets: 215 10*3/uL (ref 150–400)
RBC: 4.29 MIL/uL (ref 3.87–5.11)
RDW: 15.4 % (ref 11.5–15.5)
WBC: 6.4 10*3/uL (ref 4.0–10.5)

## 2013-09-27 LAB — GLUCOSE, CAPILLARY
GLUCOSE-CAPILLARY: 121 mg/dL — AB (ref 70–99)
Glucose-Capillary: 115 mg/dL — ABNORMAL HIGH (ref 70–99)
Glucose-Capillary: 160 mg/dL — ABNORMAL HIGH (ref 70–99)
Glucose-Capillary: 124 mg/dL — ABNORMAL HIGH (ref 70–99)

## 2013-09-27 LAB — TSH: TSH: 0.699 u[IU]/mL (ref 0.350–4.500)

## 2013-09-27 LAB — CORTISOL: CORTISOL PLASMA: 12.3 ug/dL

## 2013-09-27 LAB — URIC ACID: URIC ACID, SERUM: 5.5 mg/dL (ref 2.4–7.0)

## 2013-09-27 LAB — OSMOLALITY: Osmolality: 248 mOsm/kg — ABNORMAL LOW (ref 275–300)

## 2013-09-27 MED ORDER — SODIUM CHLORIDE 0.9 % IV BOLUS (SEPSIS)
500.0000 mL | Freq: Once | INTRAVENOUS | Status: AC
  Administered 2013-09-27: 500 mL via INTRAVENOUS

## 2013-09-27 MED ORDER — SODIUM CHLORIDE 0.9 % IV SOLN
INTRAVENOUS | Status: DC
  Administered 2013-09-27: 11:00:00 via INTRAVENOUS

## 2013-09-27 NOTE — Progress Notes (Signed)
CRITICAL VALUE ALERT  Critical value received:  Sodium 117   Date of notification:  09/27/2013   Time of notification:  0531  Critical value read back:yes  Nurse who received alert:  Eldridge Dace RN  MD notified (1st page):  K Schorr via amion textpage  Time of first page:  [phone]  MD notified (2nd page):  Time of second page:    Time MD responded:

## 2013-09-27 NOTE — Progress Notes (Signed)
Lab called critical serum osm 248.  MD notified, will continue to monitor.

## 2013-09-27 NOTE — Progress Notes (Addendum)
[facility] PROGRESS NOTE  [name] RKY:[id number] DOB: 12-23-1923 DOA: 09/24/2013 PCP: [name], MD  Interim Summary Patient is a pleasant 80 year old female, nursing home resident who was transferred from her skilled nursing facility to the emergency department on 09/25/2013 with complaints of shortness of breath. Initial workup included a chest x-ray which showed pulmonary vascular congestion. Radiology reporting mild bilateralopacities which could reflect atelectasis or mild pneumonia however difficult to fully assess given hypoexpansion. Her labs revealed the presence of hyponatremia having a sodium of 121. Back in August of 2014 she had a normal sodium of 137. Symptoms felt to be secondary to acute decompensated congestive heart failure that likely lead to hyponatremia from volume overload state. She was administered IV Lasix and admitted to telemetry. Despite receiving lasix her sodium level have continued to trend down to 117 on AM labs of 09/27/2013. She was also hypotensive with BP of 90/46 and appeared to be somewhat more lethargic. Lasix had been discontinued, a 500 mL bolus of NS was ordered. I am concerned she may be dehydrated.                                                                                                                                                                                                                                     Assessment/Plan: 1. Acute on chronic combined systolic and diastolic congestive heart failure -Transthoracic echocardiogram performed on 03/05/2011 showed an ejection fraction of 45% with grade 1 diastolic dysfunction -Initial EKG reveals sinus rhythm with the presence of a left elbow branch block that had been noted on previous EKGs. Initial BNP 1,029. -Pending echo -Lasix stopped as she became hypotensive this morning. She does not appear clinically to have signs/symptoms of acute CHF. Oxygen sats have been good, no edema  noted on exam, no JVD noted.   2.  Hyponatremia -Not certain if this is related to acute CHF and volume overload. Her sodium continues to drop despite receiving lasix, has not had overt s/s of acute CHF.   -Possibilities include SIADH. CXR did not show masses. Will check Serums Osm, urine sodium (although she did receive diuretic therapy) Urine Osm, uric acid, serum cortisol and TSH. I think she may be dehydrated. 500 mL bolus of NS ordered, followed by NS at 60 mL/hour.  -Follow up on am labs and volume status  3.  Urinary tract infection.  -Initial urinalysis showing the presence  of leukocytes with a few bacteria -Patient started on empiric IV antibiotic therapy with ceftriaxone  4.   Acute respiratory failure -Evidence by a respiratory rate of 28 and a new oxygen requirement -Secondary to CHF, improved after the administration of diuretic therapy  5.  Type 2 diabetes mellitus -Perform Accu-Cheks q. a.c. and each bedtime with sliding scale coverage  6.  Hypotension -May be related to dehydration, coreg and lasix were discontinued  Code Status: Full Family Communication:  Disposition Plan: Giving 500 mL bolus of NS today    Antibiotics:  Ceftriaxone  HPI/Subjective: Patient appears to be more lethargic today, change from yesterday. She did answer a few of my questions and is following commands. Coreg and lasix held due to hypotension  Objective: Filed Vitals:   09/27/13 0636  BP: 90/46  Pulse: 65  Temp: 97.3 F (36.3 C)  Resp: 18    Intake/Output Summary (Last 24 hours) at 09/27/13 1009 Last data filed at 09/26/13 2126  Gross per 24 hour  Intake    290 ml  Output      0 ml  Net    290 ml   Filed Weights   09/25/13 0100 09/26/13 0505 09/27/13 0636  Weight: 74.254 kg (163 lb 11.2 oz) 74.1 kg (163 lb 5.8 oz) 75.7 kg (166 lb 14.2 oz)    Exam:   General:  Patient appears a little more lethargic, is arousable and did follow commands  Cardiovascular: Regular  rate and rhythm, 2/6 SEM  Respiratory: Positive bilateral crackles, normal inspiratory effort  Abdomen: Soft nontender nondistended  Musculoskeletal: No edema  Data Reviewed: Basic Metabolic Panel:  Recent Labs Lab 09/24/13 2206 09/26/13 0253 09/27/13 0358  NA 121* 120* 117*  K 5.1 4.3 4.8  CL 84* 84* 81*  CO2 22 21 21   GLUCOSE 143* 126* 112*  BUN 17 18 26*  CREATININE 0.83 0.89 1.15*  CALCIUM 9.7 9.1 9.0   Liver Function Tests:  Recent Labs Lab 09/24/13 2206  AST 14  ALT 6  ALKPHOS 81  BILITOT <0.2*  PROT 8.6*  ALBUMIN 2.9*   No results found for this basename: LIPASE, AMYLASE,  in the last 168 hours No results found for this basename: AMMONIA,  in the last 168 hours CBC:  Recent Labs Lab 09/24/13 2337 09/27/13 0358  WBC 5.5 6.4  NEUTROABS 3.4  --   HGB 11.7* 10.8*  HCT 35.3* 32.4*  MCV 77.1* 75.5*  PLT 193 215   Cardiac Enzymes:  Recent Labs Lab 09/24/13 2250 09/26/13 1619  TROPONINI <0.30 <0.30   BNP (last 3 results)  Recent Labs  09/24/13 2250  PROBNP 1029.0*   CBG:  Recent Labs Lab 09/26/13 0607 09/26/13 1110 09/26/13 1716 09/26/13 2201 09/27/13 0555  GLUCAP 139* 145* 164* 154* 115*    Recent Results (from the past 240 hour(s))  MRSA PCR SCREENING     Status: None   Collection Time    09/25/13  1:26 AM      Result Value Ref Range Status   MRSA by PCR NEGATIVE  NEGATIVE Final   Comment:            The GeneXpert MRSA Assay (FDA     approved for NASAL specimens     only), is one component of a     comprehensive MRSA colonization     surveillance program. It is not     intended to diagnose MRSA     infection nor to guide or  monitor treatment for     MRSA infections.     Studies: Dg Chest Port 1 View  09/26/2013   CLINICAL DATA:  Follow-up CHF  EXAM: PORTABLE CHEST - 1 VIEW  COMPARISON:  09/24/2013  FINDINGS: Cardiac shadow is stable. The overall inspiratory effort is poor but stable. Degenerative changes are noted  above shoulder joints bilaterally. Diffuse interstitial changes as well as vascular congestion are seen  IMPRESSION: Stable vascular congestion   Electronically Signed   By: Rosary Catalina M.D.   On: 09/26/2013 10:11    Scheduled Meds: . antiseptic oral rinse  15 mL Mouth Rinse BID  . busPIRone  10 mg Oral BID  . cefTRIAXone (ROCEPHIN)  IV  1 g Intravenous Q24H  . docusate sodium  100 mg Oral BID  . enoxaparin (LOVENOX) injection  40 mg Subcutaneous Q24H  . ferrous sulfate  325 mg Oral BID WC  . insulin aspart  0-15 Units Subcutaneous TID WC  . insulin aspart  0-5 Units Subcutaneous QHS  . Memantine HCl ER  28 mg Oral Daily  . multivitamin with minerals  1 tablet Oral Daily  . pantoprazole  40 mg Oral Daily  . potassium chloride  10 mEq Oral Daily  . simvastatin  20 mg Oral QPM  . sodium chloride  500 mL Intravenous Once  . sodium chloride  3 mL Intravenous Q12H  . vitamin E  200 Units Oral QPM   Continuous Infusions:   Principal Problem:   Hyponatremia Active Problems:   Urinary tract infection   Hyperlipidemia   Rheumatoid arthritis(714.0)   Hypertension   DM (diabetes mellitus), type 2 with neurological complications   Anemia   Acute on chronic systolic congestive heart failure    Time spent: 35 min    [name]  [facility] Pager [phone]. If 7PM-7AM, please contact night-coverage at [email or web address], password [facility] 09/27/2013, 10:09 AM  LOS: 3 days

## 2013-09-28 DIAGNOSIS — E440 Moderate protein-calorie malnutrition: Secondary | ICD-10-CM | POA: Insufficient documentation

## 2013-09-28 DIAGNOSIS — D649 Anemia, unspecified: Secondary | ICD-10-CM

## 2013-09-28 LAB — BASIC METABOLIC PANEL
BUN: 24 mg/dL — ABNORMAL HIGH (ref 6–23)
CALCIUM: 9.1 mg/dL (ref 8.4–10.5)
CO2: 22 mEq/L (ref 19–32)
Chloride: 89 mEq/L — ABNORMAL LOW (ref 96–112)
Creatinine, Ser: 0.95 mg/dL (ref 0.50–1.10)
GFR calc Af Amer: 60 mL/min — ABNORMAL LOW (ref >90)
GFR, EST NON AFRICAN AMERICAN: 52 mL/min — AB (ref >90)
GLUCOSE: 112 mg/dL — AB (ref 70–99)
POTASSIUM: 4.9 meq/L (ref 3.7–5.3)
SODIUM: 123 meq/L — AB (ref 137–147)

## 2013-09-28 LAB — CBC
HCT: 34.5 % — ABNORMAL LOW (ref 36.0–46.0)
Hemoglobin: 11.4 g/dL — ABNORMAL LOW (ref 12.0–15.0)
MCH: 25.6 pg — AB (ref 26.0–34.0)
MCHC: 33 g/dL (ref 30.0–36.0)
MCV: 77.5 fL — ABNORMAL LOW (ref 78.0–100.0)
Platelets: 207 10*3/uL (ref 150–400)
RBC: 4.45 MIL/uL (ref 3.87–5.11)
RDW: 15.5 % (ref 11.5–15.5)
WBC: 4.9 10*3/uL (ref 4.0–10.5)

## 2013-09-28 LAB — GLUCOSE, CAPILLARY
GLUCOSE-CAPILLARY: 172 mg/dL — AB (ref 70–99)
Glucose-Capillary: 113 mg/dL — ABNORMAL HIGH (ref 70–99)
Glucose-Capillary: 128 mg/dL — ABNORMAL HIGH (ref 70–99)
Glucose-Capillary: 103 mg/dL — ABNORMAL HIGH (ref 70–99)

## 2013-09-28 MED ORDER — IPRATROPIUM-ALBUTEROL 0.5-2.5 (3) MG/3ML IN SOLN
3.0000 mL | Freq: Once | RESPIRATORY_TRACT | Status: AC
  Administered 2013-09-28: 3 mL via RESPIRATORY_TRACT
  Filled 2013-09-28: qty 3

## 2013-09-28 MED ORDER — LORAZEPAM 0.5 MG PO TABS
0.5000 mg | ORAL_TABLET | Freq: Four times a day (QID) | ORAL | Status: DC | PRN
  Administered 2013-09-28 – 2013-09-29 (×4): 0.5 mg via ORAL
  Filled 2013-09-28 (×4): qty 1

## 2013-09-28 MED ORDER — SODIUM CHLORIDE 0.9 % IV SOLN
INTRAVENOUS | Status: DC
Start: 2013-09-28 — End: 2013-09-29
  Administered 2013-09-28: 13:00:00 via INTRAVENOUS

## 2013-09-28 NOTE — Progress Notes (Signed)
[facility] PROGRESS NOTE Interim History: Patient is a pleasant 80 year old female, nursing home resident who was transferred from her skilled nursing facility to the emergency department overnight with complaints of shortness of breath. Initial workup included a chest x-ray which showed pulmonary vascular congestion. Radiology reporting mild bilateralopacities which could reflect atelectasis or mild pneumonia however difficult to fully assess given hypoexpansion. Her labs revealed the presence of hyponatremia having a sodium of 121. Back in August of 2014 she had a normal sodium of 137. Symptoms felt to be secondary to acute decompensated congestive heart failure that likely lead to hyponatremia from volume overload state. She was administered IV Lasix admitted to telemetry.   Assessment/Plan: Acute on chronic combined systolic and diastolic congestive heart failure - Echo 03/05/2011 showed an ejection fraction of 45% with grade 1 diastolic dysfunction  - Initial EKG reveals sinus rhythm with the presence of a left elbow branch block that had been noted on previous EKGs  - Patient thinks she may be feeling a little better, wt this morning at 74.1 kg,  - Pending echo, no JVD, no lower ext edema. - hold furosemide, chloride low as well as Sodium, Urine sodium and cr. Pending to calculate FENA start IV fluids.   Hyponatremia  - Unclear etiology, Urine sodium and creatinine pending. - worsening sodium and chloride with lasix, stopped on 6.24.2015, with some improvement as well as improvement in her Cr. - start IV fluids. - Repeat lab work in a.m.   Urinary tract infection.  -Initial urinalysis showing the presence of leukocytes with a few bacteria  -Patient started on empiric IV antibiotic therapy with ceftriaxone 9 6.21.2015) - UC pending  Acute respiratory failure: - Evidence by a respiratory rate of 28 and a new oxygen requirement. - Secondary to CHF, improved after the  administration of diuretic therapy.  Type 2 diabetes mellitus  -Perform Accu-Cheks q. a.c. and each bedtime with sliding scale coverage   Hypertension  -Blood pressure stable, continue lisinopril 10 mg by mouth daily       Code Status: Full  Family Communication:  Disposition Plan: Inpatinet    Consultants:  none  Procedures:  CXR  Antibiotics:  Rocephin  HPI/Subjective: Feels weak.  Objective: Filed Vitals:   09/27/13 1417 09/27/13 2104 09/28/13 0157 09/28/13 0511  BP: 95/54 114/62  123/40  Pulse: 75 84  73  Temp: 97.5 F (36.4 C) 98.4 F (36.9 C)  97.5 F (36.4 C)  TempSrc: Oral Oral  Oral  Resp: 18 18  18   Height:      Weight:    73 kg (160 lb 15 oz)  SpO2: 100% 100% 98% 100%    Intake/Output Summary (Last 24 hours) at 09/28/13 1234 Last data filed at 09/28/13 1027  Gross per 24 hour  Intake   1300 ml  Output      0 ml  Net   1300 ml   Filed Weights   09/26/13 0505 09/27/13 0636 09/28/13 0511  Weight: 74.1 kg (163 lb 5.8 oz) 75.7 kg (166 lb 14.2 oz) 73 kg (160 lb 15 oz)    Exam:  General: Alert, awake, oriented x3, in no acute distress.  HEENT: No bruits, no goiter. -JVD Heart: Regular rate and rhythm, no lower ext edema. Lungs: Good air movement, clear Abdomen: Soft, nontender, nondistended, positive bowel sounds.     Data Reviewed: Basic Metabolic Panel:  Recent Labs Lab 09/24/13 2206 09/26/13 0253 09/27/13 0358 09/28/13 0342  NA 121* 120* 117*  123*  K 5.1 4.3 4.8 4.9  CL 84* 84* 81* 89*  CO2 22 21 21 22   GLUCOSE 143* 126* 112* 112*  BUN 17 18 26* 24*  CREATININE 0.83 0.89 1.15* 0.95  CALCIUM 9.7 9.1 9.0 9.1   Liver Function Tests:  Recent Labs Lab 09/24/13 2206  AST 14  ALT 6  ALKPHOS 81  BILITOT <0.2*  PROT 8.6*  ALBUMIN 2.9*   No results found for this basename: LIPASE, AMYLASE,  in the last 168 hours No results found for this basename: AMMONIA,  in the last 168 hours CBC:  Recent Labs Lab 09/24/13 2337  09/27/13 0358 09/28/13 0342  WBC 5.5 6.4 4.9  NEUTROABS 3.4  --   --   HGB 11.7* 10.8* 11.4*  HCT 35.3* 32.4* 34.5*  MCV 77.1* 75.5* 77.5*  PLT 193 215 207   Cardiac Enzymes:  Recent Labs Lab 09/24/13 2250 09/26/13 1619  TROPONINI <0.30 <0.30   BNP (last 3 results)  Recent Labs  09/24/13 2250  PROBNP 1029.0*   CBG:  Recent Labs Lab 09/27/13 1111 09/27/13 1705 09/27/13 2127 09/28/13 0548 09/28/13 1129  GLUCAP 160* 121* 124* 113* 128*    Recent Results (from the past 240 hour(s))  MRSA PCR SCREENING     Status: None   Collection Time    09/25/13  1:26 AM      Result Value Ref Range Status   MRSA by PCR NEGATIVE  NEGATIVE Final   Comment:            The GeneXpert MRSA Assay (FDA     approved for NASAL specimens     only), is one component of a     comprehensive MRSA colonization     surveillance program. It is not     intended to diagnose MRSA     infection nor to guide or     monitor treatment for     MRSA infections.     Studies: No results found.  Scheduled Meds: . antiseptic oral rinse  15 mL Mouth Rinse BID  . busPIRone  10 mg Oral BID  . cefTRIAXone (ROCEPHIN)  IV  1 g Intravenous Q24H  . docusate sodium  100 mg Oral BID  . enoxaparin (LOVENOX) injection  40 mg Subcutaneous Q24H  . ferrous sulfate  325 mg Oral BID WC  . insulin aspart  0-15 Units Subcutaneous TID WC  . insulin aspart  0-5 Units Subcutaneous QHS  . Memantine HCl ER  28 mg Oral Daily  . multivitamin with minerals  1 tablet Oral Daily  . pantoprazole  40 mg Oral Daily  . potassium chloride  10 mEq Oral Daily  . simvastatin  20 mg Oral QPM  . sodium chloride  3 mL Intravenous Q12H  . vitamin E  200 Units Oral QPM   Continuous Infusions: . sodium chloride Stopped (09/27/13 2009)     Charlynne Cousins  [facility] Pager [phone]. If 8PM-8AM, please contact night-coverage at [email or web address], password [facility] 09/28/2013, 12:34 PM  LOS: 4 days      **Disclaimer:  This note may have been dictated with voice recognition software. Similar sounding words can inadvertently be transcribed and this note may contain transcription errors which may not have been corrected upon publication of note.**

## 2013-09-28 NOTE — Progress Notes (Signed)
Pt complain of shortness of breathe. Pt lungs clear to auscultation, O2 sat 98% on 2Lnc. Pt in no apparent distress. New order for breathing treatment. RT in room to assess pt and give treatment. Pt now asleep, Will continue to monitor. Ronnette Hila, RN

## 2013-09-29 LAB — BASIC METABOLIC PANEL
BUN: 26 mg/dL — ABNORMAL HIGH (ref 6–23)
CO2: 22 mEq/L (ref 19–32)
Calcium: 8.8 mg/dL (ref 8.4–10.5)
Chloride: 91 mEq/L — ABNORMAL LOW (ref 96–112)
Creatinine, Ser: 0.96 mg/dL (ref 0.50–1.10)
GFR calc Af Amer: 59 mL/min — ABNORMAL LOW (ref >90)
GFR calc non Af Amer: 51 mL/min — ABNORMAL LOW (ref >90)
GLUCOSE: 119 mg/dL — AB (ref 70–99)
Potassium: 5.3 mEq/L (ref 3.7–5.3)
SODIUM: 124 meq/L — AB (ref 137–147)

## 2013-09-29 LAB — GLUCOSE, CAPILLARY
GLUCOSE-CAPILLARY: 102 mg/dL — AB (ref 70–99)
Glucose-Capillary: 160 mg/dL — ABNORMAL HIGH (ref 70–99)
Glucose-Capillary: 117 mg/dL — ABNORMAL HIGH (ref 70–99)
Glucose-Capillary: 115 mg/dL — ABNORMAL HIGH (ref 70–99)

## 2013-09-29 MED ORDER — METOPROLOL TARTRATE 12.5 MG HALF TABLET
12.5000 mg | ORAL_TABLET | Freq: Two times a day (BID) | ORAL | Status: DC
  Administered 2013-09-29 – 2013-09-30 (×2): 12.5 mg via ORAL
  Filled 2013-09-29 (×4): qty 1

## 2013-09-29 MED ORDER — LISINOPRIL 5 MG PO TABS
5.0000 mg | ORAL_TABLET | Freq: Every day | ORAL | Status: DC
  Administered 2013-09-30: 5 mg via ORAL
  Filled 2013-09-29 (×2): qty 1

## 2013-09-29 MED ORDER — SODIUM CHLORIDE 0.9 % IV SOLN
INTRAVENOUS | Status: AC
  Administered 2013-09-29 – 2013-09-30 (×2): via INTRAVENOUS

## 2013-09-29 MED ORDER — SODIUM CHLORIDE 0.9 % IV SOLN: 250.0000 mL | INTRAVENOUS | Status: DC | PRN

## 2013-09-29 NOTE — Progress Notes (Signed)
UR complete.  Courtney Robarge RN, MSN 

## 2013-09-29 NOTE — Clinical Social Work Psychosocial (Signed)
Clinical Social Work Department BRIEF PSYCHOSOCIAL ASSESSMENT 09/27/2013  Patient:  [name], [name]     Account Number:  [id number]     Admit date:  09/24/2013  Clinical Social Worker:  Iona Coach  Date/Time:  09/27/2013 11:55 AM  Referred by:  Physician  Date Referred:  09/27/2013 Referred for  Other - See comment   Other Referral:   Interview type:  Patient Other interview type:    PSYCHOSOCIAL DATA Living Status:  FACILITY Admitted from facility:  [facility] Level of care:  [place] Primary support name:  [name]  [phone] Primary support relationship to patient:  CHILD, ADULT Degree of support available:   Strong support    CURRENT CONCERNS Current Concerns  Other - See comment   Other Concerns:   Return to SNF    SOCIAL WORK ASSESSMENT / PLAN 80 year old female- resident of [place]. CSW met with patient- she stated that she is ok with returning to [facility] when medically stable.  She has been pleased with the care provided. CSW spoke to [facility] at [place]- will accept patient back when medically stable.  Fl2 placed on chart for MD's signature.  CSW is attempting to reach pateint's daughter [name] per patient's request.   Assessment/plan status:  Psychosocial Support/Ongoing Assessment of Needs Other assessment/ plan:   Information/referral to community resources:   None at this time    PATIENT'S/FAMILY'S RESPONSE TO PLAN OF CARE: Patient was sitting up in bed at time of CSW's visit. Very pleasant lady who was noted to be alert and oriented.  She stated that she has been pleased with the care provided and is agreeable to return. Patient gave approval to contact her daughter [name] but stated that she would keep her daughter posted as well regarding her progress and d/c plans.

## 2013-09-29 NOTE — Progress Notes (Signed)
[facility] PROGRESS NOTE Interim History: Patient is a pleasant 80 year old female, nursing home resident who was transferred from her skilled nursing facility to the emergency department overnight with complaints of shortness of breath. Initial workup included a chest x-ray which showed pulmonary vascular congestion. Radiology reporting mild bilateralopacities which could reflect atelectasis or mild pneumonia however difficult to fully assess given hypoexpansion. Her labs revealed the presence of hyponatremia having a sodium of 121. Back in August of 2014 she had a normal sodium of 137. Symptoms felt to be secondary to acute decompensated congestive heart failure that likely lead to hyponatremia from volume overload state. She was administered IV Lasix admitted to telemetry.   Assessment/Plan: Chronic combined systolic and diastolic congestive heart failure - Echo 6.22.2015 showedLVEF is approximately 30% with severe hypokinesis of the septum, apical, distal lateral. inferior and posterior walls. The cavity size was normal. Wall thickness was increased in a pattern of mild LVH  grade 1 diastolic dysfunction. - start low dose metoprolol and ACE-I - Patient thinks she may be feeling a little better, wt this morning at 74.1 kg,  - no JVD, no lower ext edema. - hold furosemide, chloride low as well as Sodium, Urine sodium and cr. Pending to calculate FENA start IV fluids.   Hyponatremia: - most like to due to decrease intravascular vol, improving with IV fluid, cont NS. Cortisol and TSH WNL. - Urine sodium and creatinine pending. - worsening sodium and chloride with lasix, stopped on 6.24.2015, with some improvement as well as improvement in her Cr. - Repeat lab work in a.m.   Urinary tract infection.  -Initial urinalysis showing the presence of leukocytes with a few bacteria  -Patient started on empiric IV antibiotic therapy with ceftriaxone 9 6.21.2015) - UC pending  Acute  respiratory failure: - Evidence by a respiratory rate of 28 and a new oxygen requirement. - Secondary to CHF, improved after the administration of diuretic therapy.  Type 2 diabetes mellitus  -Perform Accu-Cheks q. a.c. and each bedtime with sliding scale coverage   Hypertension  -Blood pressure stable, continue lisinopril 5 mg by mouth daily     Code Status: Full  Family Communication:  Disposition Plan: Inpatinet    Consultants:  none  Procedures:  CXR  Antibiotics:  Rocephin  HPI/Subjective: Feels better no further complains  Objective: Filed Vitals:   09/28/13 1301 09/28/13 1857 09/28/13 2039 09/29/13 0529  BP: 102/45 101/49 86/51 93/51   Pulse: 67 73 75 70  Temp: 97.7 F (36.5 C) 97.4 F (36.3 C) 98.2 F (36.8 C) 97.9 F (36.6 C)  TempSrc: Oral Oral Oral Oral  Resp: 18 18 18 20   Height:      Weight:    73.8 kg (162 lb 11.2 oz)  SpO2: 100% 100% 100% 100%    Intake/Output Summary (Last 24 hours) at 09/29/13 1013 Last data filed at 09/29/13 0923  Gross per 24 hour  Intake 1792.5 ml  Output      0 ml  Net 1792.5 ml   Filed Weights   09/27/13 0636 09/28/13 0511 09/29/13 0529  Weight: 75.7 kg (166 lb 14.2 oz) 73 kg (160 lb 15 oz) 73.8 kg (162 lb 11.2 oz)    Exam:  General: Alert, awake, oriented x3, in no acute distress.  HEENT: No bruits, no goiter. -JVD Heart: Regular rate and rhythm, no lower ext edema. Lungs: Good air movement, clear Abdomen: Soft, nontender, nondistended, positive bowel sounds.     Data Reviewed: Basic Metabolic Panel:  Recent Labs Lab 09/24/13 2206 09/26/13 0253 09/27/13 0358 09/28/13 0342 09/29/13 0347  NA 121* 120* 117* 123* 124*  K 5.1 4.3 4.8 4.9 5.3  CL 84* 84* 81* 89* 91*  CO2 22 21 21 22 22   GLUCOSE 143* 126* 112* 112* 119*  BUN 17 18 26* 24* 26*  CREATININE 0.83 0.89 1.15* 0.95 0.96  CALCIUM 9.7 9.1 9.0 9.1 8.8   Liver Function Tests:  Recent Labs Lab 09/24/13 2206  AST 14  ALT 6  ALKPHOS 81   BILITOT <0.2*  PROT 8.6*  ALBUMIN 2.9*   No results found for this basename: LIPASE, AMYLASE,  in the last 168 hours No results found for this basename: AMMONIA,  in the last 168 hours CBC:  Recent Labs Lab 09/24/13 2337 09/27/13 0358 09/28/13 0342  WBC 5.5 6.4 4.9  NEUTROABS 3.4  --   --   HGB 11.7* 10.8* 11.4*  HCT 35.3* 32.4* 34.5*  MCV 77.1* 75.5* 77.5*  PLT 193 215 207   Cardiac Enzymes:  Recent Labs Lab 09/24/13 2250 09/26/13 1619  TROPONINI <0.30 <0.30   BNP (last 3 results)  Recent Labs  09/24/13 2250  PROBNP 1029.0*   CBG:  Recent Labs Lab 09/28/13 0548 09/28/13 1129 09/28/13 1704 09/28/13 2037 09/29/13 0641  GLUCAP 113* 128* 103* 172* 102*    Recent Results (from the past 240 hour(s))  MRSA PCR SCREENING     Status: None   Collection Time    09/25/13  1:26 AM      Result Value Ref Range Status   MRSA by PCR NEGATIVE  NEGATIVE Final   Comment:            The GeneXpert MRSA Assay (FDA     approved for NASAL specimens     only), is one component of a     comprehensive MRSA colonization     surveillance program. It is not     intended to diagnose MRSA     infection nor to guide or     monitor treatment for     MRSA infections.     Studies: No results found.  Scheduled Meds: . antiseptic oral rinse  15 mL Mouth Rinse BID  . busPIRone  10 mg Oral BID  . cefTRIAXone (ROCEPHIN)  IV  1 g Intravenous Q24H  . docusate sodium  100 mg Oral BID  . enoxaparin (LOVENOX) injection  40 mg Subcutaneous Q24H  . ferrous sulfate  325 mg Oral BID WC  . insulin aspart  0-15 Units Subcutaneous TID WC  . insulin aspart  0-5 Units Subcutaneous QHS  . Memantine HCl ER  28 mg Oral Daily  . multivitamin with minerals  1 tablet Oral Daily  . pantoprazole  40 mg Oral Daily  . potassium chloride  10 mEq Oral Daily  . simvastatin  20 mg Oral QPM  . sodium chloride  3 mL Intravenous Q12H  . vitamin E  200 Units Oral QPM   Continuous Infusions: . sodium  chloride 50 mL/hr at 09/28/13 [place], ABRAHAM  [facility] Pager [phone]. If 8PM-8AM, please contact night-coverage at [email or web address], password [facility] 09/29/2013, 10:13 AM  LOS: 5 days      **Disclaimer: This note may have been dictated with voice recognition software. Similar sounding words can inadvertently be transcribed and this note may contain transcription errors which may not have been corrected upon publication of note.**

## 2013-09-29 NOTE — Progress Notes (Signed)
Per discussion with MD today during Progression Rounds- possible d/c back to SNF tomorrow.  Notified patient's daughter Lelon Frohlich and Suanne Marker- Admissions at [facility]. Bed will be available when stable. CSW will discuss with patient. Lorie Phenix. [place], [place]

## 2013-09-30 ENCOUNTER — Other Ambulatory Visit: Payer: Self-pay | Admitting: *Deleted

## 2013-09-30 LAB — GLUCOSE, CAPILLARY
GLUCOSE-CAPILLARY: 145 mg/dL — AB (ref 70–99)
Glucose-Capillary: 105 mg/dL — ABNORMAL HIGH (ref 70–99)

## 2013-09-30 LAB — BASIC METABOLIC PANEL
BUN: 26 mg/dL — AB (ref 6–23)
CALCIUM: 9 mg/dL (ref 8.4–10.5)
CO2: 18 mEq/L — ABNORMAL LOW (ref 19–32)
Chloride: 96 mEq/L (ref 96–112)
Creatinine, Ser: 0.88 mg/dL (ref 0.50–1.10)
GFR calc Af Amer: 66 mL/min — ABNORMAL LOW (ref >90)
GFR, EST NON AFRICAN AMERICAN: 57 mL/min — AB (ref >90)
Glucose, Bld: 98 mg/dL (ref 70–99)
Potassium: 5.2 mEq/L (ref 3.7–5.3)
Sodium: 129 mEq/L — ABNORMAL LOW (ref 137–147)

## 2013-09-30 MED ORDER — LISINOPRIL 5 MG PO TABS: 5.0000 mg | ORAL_TABLET | Freq: Every day | ORAL | Status: DC

## 2013-09-30 MED ORDER — TRAMADOL HCL 50 MG PO TABS: 50.0000 mg | ORAL_TABLET | Freq: Four times a day (QID) | ORAL | Status: AC | PRN

## 2013-09-30 MED ORDER — METOPROLOL TARTRATE 12.5 MG HALF TABLET: 12.5000 mg | ORAL_TABLET | Freq: Two times a day (BID) | ORAL | Status: DC

## 2013-09-30 MED ORDER — CIPROFLOXACIN HCL 500 MG PO TABS: 500.0000 mg | ORAL_TABLET | Freq: Two times a day (BID) | ORAL | Status: DC

## 2013-09-30 NOTE — Care Management Note (Signed)
    Page 1 of 1   09/30/2013     2:28:30 PM CARE MANAGEMENT NOTE 09/30/2013  Patient:  [name], [name]   Account Number:  [id number]  Date Initiated:  09/30/2013  Documentation initiated by:  [facility]  Subjective/Objective Assessment:   80 y.o. female resident of the [facility] SNF with a history of Chronic systolic CHF, HTN, DM2, and Hyperlipidemia who was brought to the ED with complaints of sudden onset of SOB and orthopnea.//From [place]     Action/Plan:   IV Lasix, Monitor Sodium levels carefull.// Return to [facility].   Anticipated DC Date:  09/30/2013   Anticipated DC Plan:  SKILLED NURSING FACILITY  In-house referral  Clinical Social Worker      DC Planning Services  CM consult      Choice offered to / List presented to:             Status of service:  Completed, signed off Medicare Important Message given?  YES (If response is "NO", the following Medicare IM given date fields will be blank) Date Medicare IM given:  09/27/2013 Date Additional Medicare IM given:  09/30/2013  Discharge Disposition:    Per UR Regulation:  Reviewed for med. necessity/level of care/duration of stay  If discussed at [place] of Stay Meetings, dates discussed:    Comments:

## 2013-09-30 NOTE — Telephone Encounter (Signed)
[facility] 

## 2013-09-30 NOTE — Progress Notes (Signed)
1430 Transferred to [facility] thru [employer]

## 2013-09-30 NOTE — Discharge Summary (Signed)
Physician Discharge Summary  [name] FYB:[id number] DOB: Jul 04, 1923 DOA: 09/24/2013  PCP: [name], MD  Admit date: 09/24/2013 Discharge date: 09/30/2013  Time spent: 35 minutes  Recommendations for Outpatient Follow-up:  1. Follow up with PCP in 4 weeks. 2. At skilled nursing facility she will be weight every day if there is increase in her weight by more than 3 pounds in a day or 5 pounds in a week she is to be started on Lasix 40 mg by mouth daily. Check a be met in 3 days. 3. We'll follow up with cardiology in one week, she will need her beta blocker and her ACE titrated as tolerated.  BNP    Component Value Date/Time   PROBNP 1029.0* 09/24/2013 2250   Filed Weights   09/28/13 0511 09/29/13 0529 09/30/13 0549  Weight: 73 kg (160 lb 15 oz) 73.8 kg (162 lb 11.2 oz) 75.5 kg (166 lb 7.2 oz)     Discharge Diagnoses:  Principal Problem:   Hyponatremia Active Problems:   Urinary tract infection   Hyperlipidemia   Rheumatoid arthritis(714.0)   Hypertension   DM (diabetes mellitus), type 2 with neurological complications   Anemia   Acute on chronic systolic congestive heart failure   Malnutrition of moderate degree   Discharge Condition: Stable  Diet recommendation: low sodium   History of present illness:  80 y.o. female resident of the [facility] SNF with a history of Chronic systolic CHF, HTN, DM2, and Hyperlipidemia who was brought to the ED with complaints of sudden onset of SOB and orthopnea. She denies having any chest pain or fevers or chills. She reports having chest tightness. In the ED she was found to have a Pro BNP of 1029.0 and a chest X-Ray with Pulmonary Vascular congestion, and she was also found to have Hyponatremia with a Sodium level of 121.    Hospital Course:  Hyponatremia:  - Most like to due to decrease intravascular vol, improved with IV  NS. Cortisol and TSH WNL.  - She is not on lasix at home and she came intravascularly depleted. She  will be started on Lasix as an inpatient for there is a high risk of overdiuresis and her. - She will go to a nursing home and will check daily standing weight there is a weight increase of 3 pounds or 5 pounds in a week she is to be started on Lasix 20 mg daily. - She will follow up with cardiologist as an outpatient.  Chronic combined systolic and diastolic congestive heart failure  - Echo 6.22.2015 showedLVEF is approximately 30% with severe hypokinesis of the septum, apical, distal lateral. inferior and posterior walls. The cavity size was normal. Wall thickness was increased in a pattern of mild LVH grade 1 diastolic dysfunction.  - Start low dose metoprolol and ACE-I  - Patient thinks she may be feeling a little better, wt this morning at 75 kg. she has no JVD or lower extremity edema. - She will need to be weighted at facility and if weight increase as per direction started on oral lasix  Urinary tract infection.  - Initial urinalysis showing the presence of leukocytes with a few bacteria  - Patient started on empiric IV antibiotic therapy with ceftriaxone 9 6.21.2015.  - UC not send will treat empirically with cipro.  Acute respiratory failure:  - Evidence by a respiratory rate of 28 and a new oxygen requirement.   Type 2 diabetes mellitus  -Perform Accu-Cheks q. a.c. and  each bedtime with sliding scale coverage   Hypertension  -Blood pressure stable, continue lisinopril 5 mg by mouth daily    Procedures:  CXR  Consultations:  none  Discharge Exam: Filed Vitals:   09/30/13 0956  BP: 97/42  Pulse: 80  Temp:   Resp:     General: A&O x3 Cardiovascular: RRR Respiratory: good air movement CTA B/L  Discharge Instructions      Discharge Instructions   Diet - low sodium heart healthy    Complete by:  As directed      Heart failure skilled nursing facility orders    Complete by:  As directed   Heart Failure Follow-up Care: Verify follow-up appointments per  Patient Discharge Instructions. Arrange transportation. Reconcile home medications with discharge medication list.  Assessments:  Vital signs and oxygen saturation daily and may decrease frequency as patient condition stabilizes. If being transitioned from SNF facility to home, assess home environment for safety concerns, caregiver support and availability of low-sodium foods. Consult Surveyor, mining based on assessments. Assess for increased dyspnea, orthopnea, chest discomfort or chest pain, presence of rales/crackles, perripheral edema, abdominal distention, and/or weight gain of 3 pounds in one day or 5 pounds in one week. Daily Weights and Symptom Monitoring: Weigh daily before breakfast and after voiding using same scale and record weights on HF Zone Tool/Weight Chart to track weights with symptoms. Standing scale preferred. Call Physician/[facility] Clinic for weight gain of 3 pounds or more in 24 hours or 5 pounds in one week, OR worsening symptoms as noted above. Activity:  Develop individualized activity plan with patient/caregiver. Patient Education: Teach patient/caregiver to weigh daily and record on weight chart. Reinforce the 5 Key Messages in the "Living with Heart Failure" book. Reinforce use of HF Zone Tool to support early symptom recognition and appropriate action. Reinforce low salt diet. Reinforce fluid restriction as ordered.  Heart Failure Follow-up Care:  Or per Doctor (see comments)  Obtain the following labs:  Basic Metabolic Panel  Lab frequency:  Weekly  Fax lab results to:  [facility] at [phone]  Daily Weights and Symptom Monitoring:  Fax weight chart weekly X 2 to [facility] Clinic at [phone]  Diet:  No Added Salt  Activity:  As tolerated  Fluid restrictions:  2000 mL Fluid     Increase activity slowly    Complete by:  As directed             Medication List         busPIRone 10 MG tablet  Commonly known as:  BUSPAR  Take 10 mg by mouth 2 (two) times  daily.     ciprofloxacin 500 MG tablet  Commonly known as:  CIPRO  Take 1 tablet (500 mg total) by mouth 2 (two) times daily.     docusate sodium 100 MG capsule  Commonly known as:  COLACE  Take 100 mg by mouth 2 (two) times daily.     ferrous sulfate 325 (65 FE) MG EC tablet  Take 325 mg by mouth 2 (two) times daily.     lisinopril 5 MG tablet  Commonly known as:  PRINIVIL,ZESTRIL  Take 1 tablet (5 mg total) by mouth daily.     liver oil-zinc oxide 40 % ointment  Commonly known as:  DESITIN  Apply 1 application topically 2 (two) times daily. Apply to sacrum     metoprolol tartrate 12.5 mg Tabs tablet  Commonly known as:  LOPRESSOR  Take 0.5 tablets (12.5 mg  total) by mouth 2 (two) times daily.     NAMENDA XR 28 MG Cp24  Generic drug:  Memantine HCl ER  Take 28 mg by mouth daily. For dementia     nystatin powder  Commonly known as:  MYCOSTATIN  Apply topically 2 (two) times daily.     omeprazole 20 MG capsule  Commonly known as:  PRILOSEC  Take 20 mg by mouth every morning. For esophageal reflux     simvastatin 20 MG tablet  Commonly known as:  ZOCOR  Take 20 mg by mouth every evening. For hyperlipidemia     traMADol 50 MG tablet  Commonly known as:  ULTRAM  Take 50 mg by mouth every 6 (six) hours as needed for moderate pain.     vitamin E 200 UNIT capsule  Take 200 Units by mouth every evening.       No Known Allergies Follow-up Information   Follow up with [name], MD In 3 weeks. (hospital follow up)    Specialty:  Internal Medicine   Contact information:   [address] [place] Hartmann [place] [address] [phone]       Follow up with [facility] In 1 week. (hospital follow up)    Contact information:   [place] [address] [phone]       The results of significant diagnostics from this hospitalization (including imaging, microbiology, ancillary and  laboratory) are listed below for reference.    Significant Diagnostic Studies: Dg Chest Port 1 View  09/26/2013   CLINICAL DATA:  Follow-up CHF  EXAM: PORTABLE CHEST - 1 VIEW  COMPARISON:  09/24/2013  FINDINGS: Cardiac shadow is stable. The overall inspiratory effort is poor but stable. Degenerative changes are noted above shoulder joints bilaterally. Diffuse interstitial changes as well as vascular congestion are seen  IMPRESSION: Stable vascular congestion   Electronically Signed   By: Mellie Catalina M.D.   On: 09/26/2013 10:11   Dg Chest Portable 1 View  09/24/2013   CLINICAL DATA:  Shortness of breath.  EXAM: PORTABLE CHEST - 1 VIEW  COMPARISON:  Chest radiograph performed 08/28/2011  FINDINGS: The lungs are hypoexpanded. Vascular congestion is noted. Mild bilateral airspace opacities may reflect atelectasis or possibly mild pneumonia. No definite pleural effusion or pneumothorax is seen.  The cardiomediastinal silhouette is borderline normal in size. No acute osseous abnormalities are identified. Degenerative change is noted at the right glenohumeral joint, with chronic superior subluxation of both humeral heads, reflecting underlying chronic rotator cuff tears.  IMPRESSION: Lungs hypoexpanded. Vascular congestion seen. Mild bilateral airspace opacities may reflect atelectasis or possibly mild pneumonia, though difficult to fully assess given lung hypoexpansion.   Electronically Signed   By: Garald Balding M.D.   On: 09/24/2013 23:10    Microbiology: Recent Results (from the past 240 hour(s))  MRSA PCR SCREENING     Status: None   Collection Time    09/25/13  1:26 AM      Result Value Ref Range Status   MRSA by PCR NEGATIVE  NEGATIVE Final   Comment:            The GeneXpert MRSA Assay (FDA     approved for NASAL specimens     only), is one component of a     comprehensive MRSA colonization     surveillance program. It is not     intended to diagnose MRSA     infection nor to guide  or      monitor treatment for     MRSA infections.     Labs: Basic Metabolic Panel:  Recent Labs Lab 09/26/13 0253 09/27/13 0358 09/28/13 0342 09/29/13 0347 09/30/13 0425  NA 120* 117* 123* 124* 129*  K 4.3 4.8 4.9 5.3 5.2  CL 84* 81* 89* 91* 96  CO2 21 21 22 22  18*  GLUCOSE 126* 112* 112* 119* 98  BUN 18 26* 24* 26* 26*  CREATININE 0.89 1.15* 0.95 0.96 0.88  CALCIUM 9.1 9.0 9.1 8.8 9.0   Liver Function Tests:  Recent Labs Lab 09/24/13 2206  AST 14  ALT 6  ALKPHOS 81  BILITOT <0.2*  PROT 8.6*  ALBUMIN 2.9*   No results found for this basename: LIPASE, AMYLASE,  in the last 168 hours No results found for this basename: AMMONIA,  in the last 168 hours CBC:  Recent Labs Lab 09/24/13 2337 09/27/13 0358 09/28/13 0342  WBC 5.5 6.4 4.9  NEUTROABS 3.4  --   --   HGB 11.7* 10.8* 11.4*  HCT 35.3* 32.4* 34.5*  MCV 77.1* 75.5* 77.5*  PLT 193 215 207   Cardiac Enzymes:  Recent Labs Lab 09/24/13 2250 09/26/13 1619  TROPONINI <0.30 <0.30   BNP: BNP (last 3 results)  Recent Labs  09/24/13 2250  PROBNP 1029.0*   CBG:  Recent Labs Lab 09/29/13 0641 09/29/13 1103 09/29/13 1641 09/29/13 2201 09/30/13 0547  GLUCAP 102* 160* 117* 115* 105*       Signed:  Aileen Fass, ABRAHAM  [facility] 09/30/2013, 10:00 AM   **Disclaimer: This note may have been dictated with voice recognition software. Similar sounding words can inadvertently be transcribed and this note may contain transcription errors which may not have been corrected upon publication of note.**

## 2013-09-30 NOTE — Progress Notes (Signed)
[place] Report given to [facility], Nurse from [place] via phone

## 2013-10-01 NOTE — Progress Notes (Signed)
Clinical Social Worker facilitated patient discharge including contacting patient family and facility to confirm patient discharge plans.  Clinical information faxed to facility and family agreeable with plan. CSW arranged ambulance transport via [place] back to [place]. Patient's daughter- Juliette Mangle notified and agreed to d/c RN to call report prior to discharge.   Clinical Social Worker will sign off for now as social work intervention is no longer needed. Please consult [place] again if new need arises.   Kendell Bane, [place]

## 2013-10-04 ENCOUNTER — Encounter: Payer: Self-pay | Admitting: Cardiology

## 2013-10-05 ENCOUNTER — Encounter: Payer: PRIVATE HEALTH INSURANCE | Admitting: Cardiology

## 2013-10-05 NOTE — Progress Notes (Signed)
HPI: 80 year old female for evaluation of congestive heart failure. Recently admitted with CHF. Echocardiogram June 2015 showed an ejection fraction of 30%. She also had severe hyponatremia that improved. Patient treated with diuretics with improvement. She was noted to have a mild microcytic anemia. TSH and cortisol normal. She was discharged to a nursing facility. Since she was last seen,   Current Outpatient Prescriptions  Medication Sig Dispense Refill  . busPIRone (BUSPAR) 10 MG tablet Take 10 mg by mouth 2 (two) times daily.      . ciprofloxacin (CIPRO) 500 MG tablet Take 1 tablet (500 mg total) by mouth 2 (two) times daily.  10 tablet  0  . docusate sodium (COLACE) 100 MG capsule Take 100 mg by mouth 2 (two) times daily.      . ferrous sulfate 325 (65 FE) MG EC tablet Take 325 mg by mouth 2 (two) times daily.      Marland Kitchen lisinopril (PRINIVIL,ZESTRIL) 5 MG tablet Take 1 tablet (5 mg total) by mouth daily.  30 tablet  0  . liver oil-zinc oxide (DESITIN) 40 % ointment Apply 1 application topically 2 (two) times daily. Apply to sacrum      . Memantine HCl ER (NAMENDA XR) 28 MG CP24 Take 28 mg by mouth daily. For dementia      . metoprolol tartrate (LOPRESSOR) 12.5 mg TABS tablet Take 0.5 tablets (12.5 mg total) by mouth 2 (two) times daily.      Marland Kitchen nystatin (MYCOSTATIN) powder Apply topically 2 (two) times daily.      Marland Kitchen omeprazole (PRILOSEC) 20 MG capsule Take 20 mg by mouth every morning. For esophageal reflux      . simvastatin (ZOCOR) 20 MG tablet Take 20 mg by mouth every evening. For hyperlipidemia      . traMADol (ULTRAM) 50 MG tablet Take 1 tablet (50 mg total) by mouth every 6 (six) hours as needed for moderate pain.  120 tablet  0  . vitamin E 200 UNIT capsule Take 200 Units by mouth every evening.        No current facility-administered medications for this visit.     Past Medical History  Diagnosis Date  . PE (pulmonary embolism) 2011  . Ovarian cancer   . Hyperlipidemia     . Rheumatoid arthritis(714.0)   . GERD (gastroesophageal reflux disease)   . Hypertension   . Anxiety   . Diabetes mellitus   . Diabetes mellitus   . Hx of ovarian cancer 06/21/2011    Past Surgical History  Procedure Laterality Date  . Total hip arthroplasty      History   Social History  . Marital Status: Widowed    Spouse Name: N/A    Number of Children: N/A  . Years of Education: N/A   Occupational History  . Not on file.   Social History Main Topics  . Smoking status: Former Research scientist (life sciences)  . Smokeless tobacco: Former Systems developer  . Alcohol Use: No  . Drug Use: No  . Sexual Activity: No   Other Topics Concern  . Not on file   Social History Narrative  . No narrative on file    ROS: no fevers or chills, productive cough, hemoptysis, dysphasia, odynophagia, melena, hematochezia, dysuria, hematuria, rash, seizure activity, orthopnea, PND, pedal edema, claudication. Remaining systems are negative.  Physical Exam: Well-developed well-nourished in no acute distress.  Skin is warm and dry.  HEENT is normal.  Neck is supple.  Chest is clear to  auscultation with normal expansion.  Cardiovascular exam is regular rate and rhythm.  Abdominal exam nontender or distended. No masses palpated. Extremities show no edema. neuro grossly intact  ECG     This encounter was created in error - please disregard.

## 2013-10-06 ENCOUNTER — Non-Acute Institutional Stay (SKILLED_NURSING_FACILITY): Payer: PRIVATE HEALTH INSURANCE | Admitting: Internal Medicine

## 2013-10-06 DIAGNOSIS — I10 Essential (primary) hypertension: Secondary | ICD-10-CM

## 2013-10-06 DIAGNOSIS — F03918 Unspecified dementia, unspecified severity, with other behavioral disturbance: Secondary | ICD-10-CM

## 2013-10-06 DIAGNOSIS — D509 Iron deficiency anemia, unspecified: Secondary | ICD-10-CM

## 2013-10-06 DIAGNOSIS — N390 Urinary tract infection, site not specified: Secondary | ICD-10-CM

## 2013-10-06 DIAGNOSIS — E871 Hypo-osmolality and hyponatremia: Secondary | ICD-10-CM

## 2013-10-06 DIAGNOSIS — I509 Heart failure, unspecified: Secondary | ICD-10-CM

## 2013-10-06 DIAGNOSIS — J9601 Acute respiratory failure with hypoxia: Secondary | ICD-10-CM

## 2013-10-06 DIAGNOSIS — F419 Anxiety disorder, unspecified: Secondary | ICD-10-CM

## 2013-10-06 DIAGNOSIS — E785 Hyperlipidemia, unspecified: Secondary | ICD-10-CM

## 2013-10-06 DIAGNOSIS — E1149 Type 2 diabetes mellitus with other diabetic neurological complication: Secondary | ICD-10-CM

## 2013-10-06 DIAGNOSIS — F411 Generalized anxiety disorder: Secondary | ICD-10-CM

## 2013-10-06 DIAGNOSIS — J9600 Acute respiratory failure, unspecified whether with hypoxia or hypercapnia: Secondary | ICD-10-CM

## 2013-10-06 DIAGNOSIS — F0391 Unspecified dementia with behavioral disturbance: Secondary | ICD-10-CM

## 2013-10-06 DIAGNOSIS — I5043 Acute on chronic combined systolic (congestive) and diastolic (congestive) heart failure: Secondary | ICD-10-CM

## 2013-10-08 ENCOUNTER — Encounter: Payer: Self-pay | Admitting: Internal Medicine

## 2013-10-08 DIAGNOSIS — J9600 Acute respiratory failure, unspecified whether with hypoxia or hypercapnia: Secondary | ICD-10-CM | POA: Insufficient documentation

## 2013-10-08 NOTE — Assessment & Plan Note (Signed)
BuspAR to continue

## 2013-10-08 NOTE — Assessment & Plan Note (Signed)
Initial urinalysis showing the presence of leukocytes with a few bacteria  - Patient started on empiric IV antibiotic therapy with ceftriaxone 9 6.21.2015.  - UC not send will treat empirically with cipro

## 2013-10-08 NOTE — Assessment & Plan Note (Signed)
Continue Lisinopril 5mg 

## 2013-10-08 NOTE — Assessment & Plan Note (Signed)
Continue namenda and buspar

## 2013-10-08 NOTE — Progress Notes (Signed)
MRN: [id number] Name: [name]  Sex: female Age: 80 y.o. DOB: 1923/11/20  [place] #: [name] Facility/Room: 101B Level Of Care: SNF Provider: Inocencio Homes D Emergency Contacts: Extended Emergency Contact Information Primary Emergency Contact: [name], [place] Phone: [phone] Work Phone: [phone] Relation: Daughter Secondary Emergency Contact: [name] Phone: [phone] Relation: Grandaughter  Code Status:   Allergies: Review of patient's allergies indicates no known allergies.  Chief Complaint  Patient presents with  . nursing home admission    HPI: Patient is 80 y.o. female who is admitted to SNF after hospitalization for hyponatremia and combined CHF and acute respiratory failure.  Past Medical History  Diagnosis Date  . PE (pulmonary embolism) 2011  . Ovarian cancer   . Hyperlipidemia   . Rheumatoid arthritis(714.0)   . GERD (gastroesophageal reflux disease)   . Hypertension   . Anxiety   . Diabetes mellitus   . Diabetes mellitus   . Hx of ovarian cancer 06/21/2011    Past Surgical History  Procedure Laterality Date  . Total hip arthroplasty        Medication List       This list is accurate as of: 10/06/13 11:59 PM.  Always use your most recent med list.               busPIRone 10 MG tablet  Commonly known as:  BUSPAR  Take 10 mg by mouth 2 (two) times daily.     ciprofloxacin 500 MG tablet  Commonly known as:  CIPRO  Take 500 mg by mouth 2 (two) times daily.     docusate sodium 100 MG capsule  Commonly known as:  COLACE  Take 100 mg by mouth 2 (two) times daily.     ferrous sulfate 325 (65 FE) MG EC tablet  Take 325 mg by mouth 2 (two) times daily.     lisinopril 5 MG tablet  Commonly known as:  PRINIVIL,ZESTRIL  Take 1 tablet (5 mg total) by mouth daily.     liver oil-zinc oxide 40 % ointment  Commonly known as:  DESITIN  Apply 1  application topically 2 (two) times daily. Apply to sacrum     metoprolol tartrate 12.5 mg Tabs tablet  Commonly known as:  LOPRESSOR  Take 0.5 tablets (12.5 mg total) by mouth 2 (two) times daily.     NAMENDA XR 28 MG Cp24  Generic drug:  Memantine HCl ER  Take 28 mg by mouth daily. For dementia     nystatin powder  Commonly known as:  MYCOSTATIN  Apply topically 2 (two) times daily.     omeprazole 20 MG capsule  Commonly known as:  PRILOSEC  Take 20 mg by mouth every morning. For esophageal reflux     simvastatin 20 MG tablet  Commonly known as:  ZOCOR  Take 20 mg by mouth every evening. For hyperlipidemia     traMADol 50 MG tablet  Commonly known as:  ULTRAM  Take 1 tablet (50 mg total) by mouth every 6 (six) hours as needed for moderate pain.     vitamin E 200 UNIT capsule  Take 200 Units by mouth every evening.        Meds ordered this encounter  Medications  . ciprofloxacin (CIPRO) 500 MG tablet    Sig: Take 500 mg by mouth 2 (two) times daily.    Immunization History  Administered Date(s) Administered  . Tdap 07/01/2011, 06/11/2012    History  Substance Use Topics  . Smoking status: Former Research scientist (life sciences)  . Smokeless tobacco: Former Systems developer  . Alcohol Use: No    Family history is noncontributory    Review of Systems  DATA OBTAINED: from patient; no c/o GENERAL: Feels well no fevers, fatigue, appetite changes SKIN: No itching, rash  EYES: No eye pain, redness, discharge EARS: No earache, tinnitus, change in hearing NOSE: No congestion, drainage or bleeding  MOUTH/THROAT: No mouth or tooth pain, No sore throat RESPIRATORY: No cough, wheezing, SOB CARDIAC: No chest pain, palpitations, lower extremity edema  GI: No abdominal pain, No N/V/D or constipation, No heartburn or reflux  GU: No dysuria, frequency or urgency, or incontinence  MUSCULOSKELETAL: No unrelieved bone/joint pain NEUROLOGIC: No headache, dizziness or focal weakness PSYCHIATRIC: No overt  anxiety or sadness. Sleeps well. No behavior issue.   Filed Vitals:   10/06/13 2209  BP: 89/53  Pulse: 64  Temp: 98.9 F (37.2 C)  Resp: 20    Physical Exam  GENERAL APPEARANCE: Alert, conversant. Appropriately groomed. No acute distress, obese  SKIN: No diaphoresis rash HEAD: Normocephalic, atraumatic  EYES: Conjunctiva/lids clear. Pupils round, reactive. EOMs intact.  EARS: External exam WNL, canals clear. Hearing grossly normal.  NOSE: No deformity or discharge.  MOUTH/THROAT: Lips w/o lesions RESPIRATORY: Breathing is even, unlabored. Lung sounds are clear   CARDIOVASCULAR: Heart RRR no murmurs, rubs or gallops. No peripheral edema.  GASTROINTESTINAL: Abdomen is soft, non-tender, not distended w/ normal bowel sounds GENITOURINARY: Bladder non tender, not distended  MUSCULOSKELETAL: No abnormal joints or musculature NEUROLOGIC:  Cranial nerves 2-12 grossly intact. Moves all extremities  PSYCHIATRIC: Mood and affect appropriate to situation, no behavioral issues  Patient Active Problem List   Diagnosis Date Noted  . Acute respiratory failure 10/08/2013  . Malnutrition of moderate degree 09/28/2013  . Hyponatremia 09/25/2013  . Acute systolic CHF (congestive heart failure) 09/25/2013  . Acute on chronic combined systolic and diastolic congestive heart failure 09/25/2013  . Anemia 09/09/2013  . Dementia with behavioral disturbance 07/22/2013  . Obesity, unspecified 05/16/2013  . GERD (gastroesophageal reflux disease)   . Hypertension   . Anxiety   . DM (diabetes mellitus), type 2 with neurological complications   . Constipation 06/23/2011  . Urinary tract infection 06/21/2011  . Hyperlipidemia 06/21/2011  . Rheumatoid arthritis(714.0) 06/21/2011  . Hx pulmonary embolism 06/21/2011  . Wheelchair bound 06/21/2011    CBC    Component Value Date/Time   WBC 4.9 09/28/2013 0342   RBC 4.45 09/28/2013 0342   HGB 11.4* 09/28/2013 0342   HCT 34.5* 09/28/2013 0342   PLT 207  09/28/2013 0342   MCV 77.5* 09/28/2013 0342   LYMPHSABS 1.6 09/24/2013 2337   MONOABS 0.3 09/24/2013 2337   EOSABS 0.2 09/24/2013 2337   BASOSABS 0.0 09/24/2013 2337    CMP     Component Value Date/Time   NA 129* 09/30/2013 0425   K 5.2 09/30/2013 0425   CL 96 09/30/2013 0425   CO2 18* 09/30/2013 0425   GLUCOSE 98 09/30/2013 0425   BUN 26* 09/30/2013 0425   CREATININE 0.88 09/30/2013 0425   CALCIUM 9.0 09/30/2013 0425   PROT 8.6* 09/24/2013 2206   ALBUMIN 2.9* 09/24/2013 2206   AST 14 09/24/2013 2206   ALT 6 09/24/2013 2206   ALKPHOS 81 09/24/2013 2206   BILITOT <0.2* 09/24/2013 2206   GFRNONAA 57* 09/30/2013 0425   GFRAA 66* 09/30/2013 0425  Assessment and Plan  Hyponatremia Most like to due to decrease intravascular vol, improved with IV NS. Cortisol and TSH WNL.  - She is not on lasix at home and she came intravascularly depleted. She will be started on Lasix as an inpatient for there is a high risk of overdiuresis and her.  - She will go to a nursing home and will check daily standing weight there is a weight increase of 3 pounds or 5 pounds in a week she is to be started on Lasix 20 mg daily.  - She will follow up with cardiologist as an outpatient   Acute on chronic combined systolic and diastolic congestive heart failure Echo 6.22.2015 showedLVEF is approximately 30% with severe hypokinesis of the septum, apical, distal lateral. inferior and posterior walls. The cavity size was normal. Wall thickness was increased in a pattern of mild LVH grade 1 diastolic dysfunction.  - Start low dose metoprolol and ACE-I  - Patient thinks she may be feeling a little better, wt this morning at 75 kg. she has no JVD or lower extremity edema.  - She will need to be weighted at facility and if weight increase as per direction started on oral lasix   Urinary tract infection Initial urinalysis showing the presence of leukocytes with a few bacteria  - Patient started on empiric IV antibiotic therapy  with ceftriaxone 9 6.21.2015.  - UC not send will treat empirically with cipro   Acute respiratory failure Evidence by a respiratory rate of 28 and a new oxygen requirement   DM (diabetes mellitus), type 2 with neurological complications Perform Accu-Cheks q. a.c. and each bedtime with sliding scale coverage    Hypertension Continue Lisinopril 5 mg  Dementia with behavioral disturbance Continue namenda and buspar  Anxiety BuspAR to continue  Hyperlipidemia Zocor to continue  Anemia Stable    [name], MD

## 2013-10-08 NOTE — Assessment & Plan Note (Signed)
Most like to due to decrease intravascular vol, improved with IV NS. Cortisol and TSH WNL.  - She is not on lasix at home and she came intravascularly depleted. She will be started on Lasix as an inpatient for there is a high risk of overdiuresis and her.  - She will go to a nursing home and will check daily standing weight there is a weight increase of 3 pounds or 5 pounds in a week she is to be started on Lasix 20 mg daily.  - She will follow up with cardiologist as an outpatient

## 2013-10-08 NOTE — Assessment & Plan Note (Signed)
Zocor to continue

## 2013-10-08 NOTE — Assessment & Plan Note (Signed)
Stable

## 2013-10-08 NOTE — Assessment & Plan Note (Signed)
Perform Accu-Cheks q. a.c. and each bedtime with sliding scale coverage

## 2013-10-08 NOTE — Assessment & Plan Note (Signed)
Evidence by a respiratory rate of 28 and a new oxygen requirement

## 2013-10-08 NOTE — Assessment & Plan Note (Signed)
Echo 6.22.2015 showedLVEF is approximately 30% with severe hypokinesis of the septum, apical, distal lateral. inferior and posterior walls. The cavity size was normal. Wall thickness was increased in a pattern of mild LVH grade 1 diastolic dysfunction.  - Start low dose metoprolol and ACE-I  - Patient thinks she may be feeling a little better, wt this morning at 75 kg. she has no JVD or lower extremity edema.  - She will need to be weighted at facility and if weight increase as per direction started on oral lasix

## 2013-10-13 ENCOUNTER — Emergency Department (HOSPITAL_COMMUNITY): Payer: PRIVATE HEALTH INSURANCE

## 2013-10-13 ENCOUNTER — Encounter (HOSPITAL_COMMUNITY): Payer: Self-pay | Admitting: Emergency Medicine

## 2013-10-13 ENCOUNTER — Inpatient Hospital Stay (HOSPITAL_COMMUNITY)
Admission: EM | Admit: 2013-10-13 | Discharge: 2013-10-16 | DRG: 682 | Disposition: A | Discharge status: Skilled Nursing Facility | Payer: PRIVATE HEALTH INSURANCE | Attending: Internal Medicine | Admitting: Internal Medicine

## 2013-10-13 DIAGNOSIS — F411 Generalized anxiety disorder: Secondary | ICD-10-CM | POA: Diagnosis present

## 2013-10-13 DIAGNOSIS — Z833 Family history of diabetes mellitus: Secondary | ICD-10-CM

## 2013-10-13 DIAGNOSIS — R6521 Severe sepsis with septic shock: Secondary | ICD-10-CM

## 2013-10-13 DIAGNOSIS — R4182 Altered mental status, unspecified: Secondary | ICD-10-CM

## 2013-10-13 DIAGNOSIS — R031 Nonspecific low blood-pressure reading: Secondary | ICD-10-CM | POA: Diagnosis present

## 2013-10-13 DIAGNOSIS — E785 Hyperlipidemia, unspecified: Secondary | ICD-10-CM

## 2013-10-13 DIAGNOSIS — Z86711 Personal history of pulmonary embolism: Secondary | ICD-10-CM

## 2013-10-13 DIAGNOSIS — I950 Idiopathic hypotension: Secondary | ICD-10-CM

## 2013-10-13 DIAGNOSIS — K219 Gastro-esophageal reflux disease without esophagitis: Secondary | ICD-10-CM | POA: Diagnosis present

## 2013-10-13 DIAGNOSIS — G309 Alzheimer's disease, unspecified: Secondary | ICD-10-CM | POA: Diagnosis present

## 2013-10-13 DIAGNOSIS — E119 Type 2 diabetes mellitus without complications: Secondary | ICD-10-CM | POA: Diagnosis present

## 2013-10-13 DIAGNOSIS — R578 Other shock: Secondary | ICD-10-CM | POA: Diagnosis present

## 2013-10-13 DIAGNOSIS — E440 Moderate protein-calorie malnutrition: Secondary | ICD-10-CM

## 2013-10-13 DIAGNOSIS — M069 Rheumatoid arthritis, unspecified: Secondary | ICD-10-CM

## 2013-10-13 DIAGNOSIS — E875 Hyperkalemia: Secondary | ICD-10-CM

## 2013-10-13 DIAGNOSIS — Z96649 Presence of unspecified artificial hip joint: Secondary | ICD-10-CM

## 2013-10-13 DIAGNOSIS — R40244 Other coma, without documented Glasgow coma scale score, or with partial score reported, unspecified time: Secondary | ICD-10-CM

## 2013-10-13 DIAGNOSIS — F0280 Dementia in other diseases classified elsewhere without behavioral disturbance: Secondary | ICD-10-CM | POA: Diagnosis present

## 2013-10-13 DIAGNOSIS — I509 Heart failure, unspecified: Secondary | ICD-10-CM | POA: Diagnosis present

## 2013-10-13 DIAGNOSIS — R571 Hypovolemic shock: Secondary | ICD-10-CM

## 2013-10-13 DIAGNOSIS — I10 Essential (primary) hypertension: Secondary | ICD-10-CM

## 2013-10-13 DIAGNOSIS — E871 Hypo-osmolality and hyponatremia: Secondary | ICD-10-CM

## 2013-10-13 DIAGNOSIS — D509 Iron deficiency anemia, unspecified: Secondary | ICD-10-CM

## 2013-10-13 DIAGNOSIS — I5023 Acute on chronic systolic (congestive) heart failure: Secondary | ICD-10-CM

## 2013-10-13 DIAGNOSIS — N179 Acute kidney failure, unspecified: Principal | ICD-10-CM

## 2013-10-13 DIAGNOSIS — E872 Acidosis, unspecified: Secondary | ICD-10-CM

## 2013-10-13 DIAGNOSIS — F0281 Dementia in other diseases classified elsewhere with behavioral disturbance: Secondary | ICD-10-CM | POA: Diagnosis present

## 2013-10-13 DIAGNOSIS — E43 Unspecified severe protein-calorie malnutrition: Secondary | ICD-10-CM | POA: Diagnosis present

## 2013-10-13 DIAGNOSIS — Z87891 Personal history of nicotine dependence: Secondary | ICD-10-CM

## 2013-10-13 DIAGNOSIS — J9601 Acute respiratory failure with hypoxia: Secondary | ICD-10-CM

## 2013-10-13 DIAGNOSIS — J811 Chronic pulmonary edema: Secondary | ICD-10-CM | POA: Diagnosis not present

## 2013-10-13 DIAGNOSIS — I5022 Chronic systolic (congestive) heart failure: Secondary | ICD-10-CM | POA: Diagnosis present

## 2013-10-13 DIAGNOSIS — F02818 Dementia in other diseases classified elsewhere, unspecified severity, with other behavioral disturbance: Secondary | ICD-10-CM | POA: Diagnosis present

## 2013-10-13 DIAGNOSIS — F0391 Unspecified dementia with behavioral disturbance: Secondary | ICD-10-CM | POA: Diagnosis present

## 2013-10-13 DIAGNOSIS — I959 Hypotension, unspecified: Secondary | ICD-10-CM

## 2013-10-13 DIAGNOSIS — I428 Other cardiomyopathies: Secondary | ICD-10-CM | POA: Diagnosis present

## 2013-10-13 DIAGNOSIS — F03918 Unspecified dementia, unspecified severity, with other behavioral disturbance: Secondary | ICD-10-CM | POA: Diagnosis present

## 2013-10-13 DIAGNOSIS — I5043 Acute on chronic combined systolic (congestive) and diastolic (congestive) heart failure: Secondary | ICD-10-CM

## 2013-10-13 DIAGNOSIS — Z993 Dependence on wheelchair: Secondary | ICD-10-CM

## 2013-10-13 DIAGNOSIS — D649 Anemia, unspecified: Secondary | ICD-10-CM | POA: Diagnosis present

## 2013-10-13 DIAGNOSIS — F419 Anxiety disorder, unspecified: Secondary | ICD-10-CM

## 2013-10-13 DIAGNOSIS — F0390 Unspecified dementia without behavioral disturbance: Secondary | ICD-10-CM

## 2013-10-13 DIAGNOSIS — E1149 Type 2 diabetes mellitus with other diabetic neurological complication: Secondary | ICD-10-CM

## 2013-10-13 DIAGNOSIS — J810 Acute pulmonary edema: Secondary | ICD-10-CM

## 2013-10-13 DIAGNOSIS — Z8543 Personal history of malignant neoplasm of ovary: Secondary | ICD-10-CM

## 2013-10-13 DIAGNOSIS — I5021 Acute systolic (congestive) heart failure: Secondary | ICD-10-CM

## 2013-10-13 DIAGNOSIS — E669 Obesity, unspecified: Secondary | ICD-10-CM

## 2013-10-13 DIAGNOSIS — A419 Sepsis, unspecified organism: Secondary | ICD-10-CM

## 2013-10-13 LAB — CBC WITH DIFFERENTIAL/PLATELET
Basophils Absolute: 0 10*3/uL (ref 0.0–0.1)
Basophils Relative: 0 % (ref 0–1)
EOS PCT: 2 % (ref 0–5)
Eosinophils Absolute: 0.1 10*3/uL (ref 0.0–0.7)
HEMATOCRIT: 32.9 % — AB (ref 36.0–46.0)
HEMOGLOBIN: 11.4 g/dL — AB (ref 12.0–15.0)
LYMPHS ABS: 1.8 10*3/uL (ref 0.7–4.0)
LYMPHS PCT: 26 % (ref 12–46)
MCH: 26.1 pg (ref 26.0–34.0)
MCHC: 34.7 g/dL (ref 30.0–36.0)
MCV: 75.5 fL — AB (ref 78.0–100.0)
Monocytes Absolute: 0.4 10*3/uL (ref 0.1–1.0)
Monocytes Relative: 6 % (ref 3–12)
Neutro Abs: 4.6 10*3/uL (ref 1.7–7.7)
Neutrophils Relative %: 66 % (ref 43–77)
PLATELETS: 206 10*3/uL (ref 150–400)
RBC: 4.36 MIL/uL (ref 3.87–5.11)
RDW: 15.5 % (ref 11.5–15.5)
WBC: 6.9 10*3/uL (ref 4.0–10.5)

## 2013-10-13 LAB — I-STAT TROPONIN, ED: Troponin i, poc: 0.01 ng/mL (ref 0.00–0.08)

## 2013-10-13 NOTE — ED Notes (Signed)
Pt presents via [facility] from [facility] with c/o sob and weakness. Initial HR reported by EMS was 32. Pt currently  HR 58. Pt c/o ShOB at this time. Pt has extensive heart history. Pt is alert and oriented to person.only.

## 2013-10-13 NOTE — ED Provider Notes (Addendum)
CSN: [id number]     Arrival date & time 10/13/13  2240 History   First MD Initiated Contact with Patient 10/13/13 2304     Chief Complaint  Patient presents with  . Shortness of Breath  . Weakness  . Bradycardia     (Consider location/radiation/quality/duration/timing/severity/associated sxs/prior Treatment) HPI Level V caveats secondary to dementia.  This patient is an elderly woman with cardiomyopathy - LVEF 30% with global hypokinesis in 08/2013,  dementia, diabetes, rheumatoid arthritis, hypertension, history of pulmonary embolus and history of cancer.  She is brought to the emergency department from her skilled nursing facility where she resides. Nursing facility said that the patient seemed short of breath. The patient's chief complaint during my history is "pain in my bottom". The patient denies cough, shortness of breath or fever.  However, she answers questions with inconsistent responses.   Paramedics report that the patient was hypotensive with SBP to the 70s when they arrived at the scene. The patient is receiving supplemental 02 at 3L during my exam. But, is unable to tell me if she uses oxygen at home.   Past Medical History  Diagnosis Date  . PE (pulmonary embolism) 2011  . Ovarian cancer   . Hyperlipidemia   . Rheumatoid arthritis(714.0)   . GERD (gastroesophageal reflux disease)   . Hypertension   . Anxiety   . Diabetes mellitus   . Diabetes mellitus   . Hx of ovarian cancer 06/21/2011   Past Surgical History  Procedure Laterality Date  . Total hip arthroplasty     Family History  Problem Relation Age of Onset  . Diabetes type II     History  Substance Use Topics  . Smoking status: Former Research scientist (life sciences)  . Smokeless tobacco: Former Systems developer  . Alcohol Use: No   OB History   Grav Para Term Preterm Abortions TAB SAB Ect Mult Living                 Review of Systems  Unable to obtain full review of systems secondary to dementia-Level 5.     Allergies   Review of patient's allergies indicates no known allergies.  Home Medications   Prior to Admission medications   Medication Sig Start Date End Date Taking? Authorizing Provider  busPIRone (BUSPAR) 10 MG tablet Take 10 mg by mouth 2 (two) times daily.    Historical Provider, MD  ciprofloxacin (CIPRO) 500 MG tablet Take 500 mg by mouth 2 (two) times daily.    Historical Provider, MD  docusate sodium (COLACE) 100 MG capsule Take 100 mg by mouth 2 (two) times daily.    Historical Provider, MD  ferrous sulfate 325 (65 FE) MG EC tablet Take 325 mg by mouth 2 (two) times daily.    Historical Provider, MD  lisinopril (PRINIVIL,ZESTRIL) 5 MG tablet Take 1 tablet (5 mg total) by mouth daily. 09/30/13   Charlynne Cousins, MD  liver oil-zinc oxide (DESITIN) 40 % ointment Apply 1 application topically 2 (two) times daily. Apply to sacrum    Historical Provider, MD  Memantine HCl ER (NAMENDA XR) 28 MG CP24 Take 28 mg by mouth daily. For dementia    Historical Provider, MD  metoprolol tartrate (LOPRESSOR) 12.5 mg TABS tablet Take 0.5 tablets (12.5 mg total) by mouth 2 (two) times daily. 09/30/13   Charlynne Cousins, MD  nystatin (MYCOSTATIN) powder Apply topically 2 (two) times daily.    Historical Provider, MD  omeprazole (PRILOSEC) 20 MG capsule Take 20 mg by mouth  every morning. For esophageal reflux    Historical Provider, MD  simvastatin (ZOCOR) 20 MG tablet Take 20 mg by mouth every evening. For hyperlipidemia    Historical Provider, MD  traMADol (ULTRAM) 50 MG tablet Take 1 tablet (50 mg total) by mouth every 6 (six) hours as needed for moderate pain. 09/30/13   Gerlene Fee, NP  vitamin E 200 UNIT capsule Take 200 Units by mouth every evening.     Historical Provider, MD   BP 87/44  Pulse 66  Temp(Src) 98.8 F (37.1 C) (Rectal)  Resp 20  SpO2 100% Physical Exam Gen: well developed, thin, chronically ill-appearing Head: NCAT Eyes: PERL, EOMI Nose: no epistaixis or  rhinorrhea Mouth/throat: mucosa is mildly dehydrated appearing and pink, poor oral dentition, extensive dental caries Neck: supple, no stridor Lungs: RR 24/min, scattered bibasilar rales, no rhonchi or wheezing CV: RRR, no murmur, extremities appear well perfused.  Abd: soft, notender, nondistended Back: no ttp, no cva ttp Skin: warm and dry Ext: normal to inspection, no dependent edema Neuro: CN ii-xii grossly intact, no focal deficits Psyche; mildly agitated affect,  calm and cooperative.   ED Course  Procedures (including critical care time) Labs Review  EKG: normal rate and rhythm, normal axis, no acute ischemic changes, no acute ST-T segment changes.    Results for orders placed during the hospital encounter of 10/13/13 (from the past 24 hour(s))  CBC WITH DIFFERENTIAL     Status: Abnormal   Collection Time    10/13/13 11:18 PM      Result Value Ref Range   WBC 6.9  4.0 - 10.5 K/uL   RBC 4.36  3.87 - 5.11 MIL/uL   Hemoglobin 11.4 (*) 12.0 - 15.0 g/dL   HCT 32.9 (*) 36.0 - 46.0 %   MCV 75.5 (*) 78.0 - 100.0 fL   MCH 26.1  26.0 - 34.0 pg   MCHC 34.7  30.0 - 36.0 g/dL   RDW 15.5  11.5 - 15.5 %   Platelets 206  150 - 400 K/uL   Neutrophils Relative % 66  43 - 77 %   Neutro Abs 4.6  1.7 - 7.7 K/uL   Lymphocytes Relative 26  12 - 46 %   Lymphs Abs 1.8  0.7 - 4.0 K/uL   Monocytes Relative 6  3 - 12 %   Monocytes Absolute 0.4  0.1 - 1.0 K/uL   Eosinophils Relative 2  0 - 5 %   Eosinophils Absolute 0.1  0.0 - 0.7 K/uL   Basophils Relative 0  0 - 1 %   Basophils Absolute 0.0  0.0 - 0.1 K/uL  BASIC METABOLIC PANEL     Status: Abnormal   Collection Time    10/13/13 11:18 PM      Result Value Ref Range   Sodium 123 (*) 137 - 147 mEq/L   Potassium 5.8 (*) 3.7 - 5.3 mEq/L   Chloride 89 (*) 96 - 112 mEq/L   CO2 16 (*) 19 - 32 mEq/L   Glucose, Bld 143 (*) 70 - 99 mg/dL   BUN 74 (*) 6 - 23 mg/dL   Creatinine, Ser 2.19 (*) 0.50 - 1.10 mg/dL   Calcium 9.0  8.4 - 10.5 mg/dL    GFR calc non Af Amer 19 (*) >90 mL/min   GFR calc Af Amer 22 (*) >90 mL/min   Anion gap 18 (*) 5 - 15  PRO B NATRIURETIC PEPTIDE     Status: Abnormal  Collection Time    10/13/13 11:18 PM      Result Value Ref Range   Pro B Natriuretic peptide (BNP) 6255.0 (*) 0 - 450 pg/mL  I-STAT TROPOININ, ED     Status: None   Collection Time    10/13/13 11:26 PM      Result Value Ref Range   Troponin i, poc 0.01  0.00 - 0.08 ng/mL   Comment 3            DG Chest Portable 1 View (Final result)  Result time: 10/14/13 00:09:39    Final result by Rad Results In Interface (10/14/13 00:09:39)    Narrative:   CLINICAL DATA: Shortness of breath.  EXAM: PORTABLE CHEST - 1 VIEW  COMPARISON: 09/26/2013  FINDINGS: Shallow inspiration. Borderline heart size with mild vascular congestion. No definite edema or airspace disease. Colonic interposition under the right hemidiaphragm. Degenerative changes in the shoulders. No change since prior study.  IMPRESSION: Shallow inspiration. Mild vascular congestion without change.   Electronically Signed By: Lucienne Capers M.D. On: 10/14/2013 00:09   EKG: nsr, no acute ischemic changes, normal intervals, normal axis, normal qrs complex CRITICAL CARE Performed by: Elyn Peers   Total critical care time: 80 minutes  Critical care time was exclusive of separately billable procedures and treating other patients.  Critical care was necessary to treat or prevent imminent or life-threatening deterioration.  Critical care was time spent personally by me on the following activities: development of treatment plan with patient and/or surrogate as well as nursing, discussions with consultants, evaluation of patient's response to treatment, examination of patient, obtaining history from patient or surrogate, ordering and performing treatments and interventions, ordering and review of laboratory studies, ordering and review of radiographic studies, pulse oximetry  and re-evaluation of patient's condition.    MDM   The patient is ill appearing and intermittently hypotensive. Her CXR is of very limited use as it is a portable study with significant rotation of the torso. Cannot exclude basilar infilrate, there is notable mild vascular congestion. The patient has an elevated BNP level to 6,000 - significantly higher than baseline. In addition, she has acute renal failure with anion gap acidosis and mild hyperkelamia.  We will tx with IV lasix, obtain blood cultures, lactic acid level, tx with kayexelate and admit to SDU v. Tele. Elyn Peers, MD 10/14/13 [phone]  Patient has been intermittently hypotensive but with palpable peripheral pulses. Case discussed with intensivist who recommends that we tx with IVF resuscitation and re-evaluate. Normal lactic acid level indicates reassurance against tissue hypoperfusion.   0236: Patient is s/p 755mL NS. Remains hypotensive. We will repeat lactic acid level and continue volume repleation.   Elyn Peers, MD 10/17/13 [phone]

## 2013-10-14 DIAGNOSIS — R4182 Altered mental status, unspecified: Secondary | ICD-10-CM

## 2013-10-14 DIAGNOSIS — R4020 Unspecified coma: Secondary | ICD-10-CM

## 2013-10-14 DIAGNOSIS — J9600 Acute respiratory failure, unspecified whether with hypoxia or hypercapnia: Secondary | ICD-10-CM

## 2013-10-14 DIAGNOSIS — J811 Chronic pulmonary edema: Secondary | ICD-10-CM | POA: Diagnosis not present

## 2013-10-14 DIAGNOSIS — I5043 Acute on chronic combined systolic (congestive) and diastolic (congestive) heart failure: Secondary | ICD-10-CM

## 2013-10-14 DIAGNOSIS — N179 Acute kidney failure, unspecified: Principal | ICD-10-CM

## 2013-10-14 DIAGNOSIS — R6520 Severe sepsis without septic shock: Secondary | ICD-10-CM

## 2013-10-14 DIAGNOSIS — F0390 Unspecified dementia without behavioral disturbance: Secondary | ICD-10-CM

## 2013-10-14 DIAGNOSIS — I509 Heart failure, unspecified: Secondary | ICD-10-CM

## 2013-10-14 DIAGNOSIS — J810 Acute pulmonary edema: Secondary | ICD-10-CM

## 2013-10-14 DIAGNOSIS — I5023 Acute on chronic systolic (congestive) heart failure: Secondary | ICD-10-CM

## 2013-10-14 DIAGNOSIS — A419 Sepsis, unspecified organism: Secondary | ICD-10-CM

## 2013-10-14 DIAGNOSIS — I959 Hypotension, unspecified: Secondary | ICD-10-CM | POA: Insufficient documentation

## 2013-10-14 DIAGNOSIS — I059 Rheumatic mitral valve disease, unspecified: Secondary | ICD-10-CM

## 2013-10-14 DIAGNOSIS — R6521 Severe sepsis with septic shock: Secondary | ICD-10-CM

## 2013-10-14 LAB — URINALYSIS, ROUTINE W REFLEX MICROSCOPIC
BILIRUBIN URINE: NEGATIVE
GLUCOSE, UA: NEGATIVE mg/dL
Ketones, ur: NEGATIVE mg/dL
Nitrite: NEGATIVE
PH: 6 (ref 5.0–8.0)
Protein, ur: NEGATIVE mg/dL
SPECIFIC GRAVITY, URINE: 1.009 (ref 1.005–1.030)
UROBILINOGEN UA: 0.2 mg/dL (ref 0.0–1.0)

## 2013-10-14 LAB — CORTISOL
CORTISOL PLASMA: 9 ug/dL
CORTISOL PLASMA: 10.7 ug/dL

## 2013-10-14 LAB — BASIC METABOLIC PANEL
ANION GAP: 16 — AB (ref 5–15)
Anion gap: 18 — ABNORMAL HIGH (ref 5–15)
BUN: 74 mg/dL — ABNORMAL HIGH (ref 6–23)
BUN: 69 mg/dL — ABNORMAL HIGH (ref 6–23)
CALCIUM: 9 mg/dL (ref 8.4–10.5)
CHLORIDE: 94 meq/L — AB (ref 96–112)
CO2: 16 meq/L — AB (ref 19–32)
CO2: 16 mEq/L — ABNORMAL LOW (ref 19–32)
CREATININE: 2.09 mg/dL — AB (ref 0.50–1.10)
Calcium: 8.2 mg/dL — ABNORMAL LOW (ref 8.4–10.5)
Chloride: 89 mEq/L — ABNORMAL LOW (ref 96–112)
Creatinine, Ser: 2.19 mg/dL — ABNORMAL HIGH (ref 0.50–1.10)
GFR calc Af Amer: 22 mL/min — ABNORMAL LOW (ref >90)
GFR calc non Af Amer: 19 mL/min — ABNORMAL LOW (ref >90)
GFR calc non Af Amer: 20 mL/min — ABNORMAL LOW (ref >90)
GFR, EST AFRICAN AMERICAN: 23 mL/min — AB (ref >90)
GLUCOSE: 143 mg/dL — AB (ref 70–99)
Glucose, Bld: 145 mg/dL — ABNORMAL HIGH (ref 70–99)
Potassium: 5.8 mEq/L — ABNORMAL HIGH (ref 3.7–5.3)
Potassium: 5.3 mEq/L (ref 3.7–5.3)
SODIUM: 123 meq/L — AB (ref 137–147)
Sodium: 126 mEq/L — ABNORMAL LOW (ref 137–147)

## 2013-10-14 LAB — I-STAT ARTERIAL BLOOD GAS, ED
Acid-base deficit: 7 mmol/L — ABNORMAL HIGH (ref 0.0–2.0)
Bicarbonate: 18.3 mEq/L — ABNORMAL LOW (ref 20.0–24.0)
O2 SAT: 95 %
TCO2: 19 mmol/L (ref 0–100)
pCO2 arterial: 36 mmHg (ref 35.0–45.0)
pH, Arterial: 7.314 — ABNORMAL LOW (ref 7.350–7.450)
pO2, Arterial: 82 mmHg (ref 80.0–100.0)

## 2013-10-14 LAB — CBC
HCT: 30.2 % — ABNORMAL LOW (ref 36.0–46.0)
Hemoglobin: 10.2 g/dL — ABNORMAL LOW (ref 12.0–15.0)
MCH: 25.8 pg — ABNORMAL LOW (ref 26.0–34.0)
MCHC: 33.8 g/dL (ref 30.0–36.0)
MCV: 76.5 fL — ABNORMAL LOW (ref 78.0–100.0)
Platelets: 183 10*3/uL (ref 150–400)
RBC: 3.95 MIL/uL (ref 3.87–5.11)
RDW: 15.7 % — ABNORMAL HIGH (ref 11.5–15.5)
WBC: 5.6 10*3/uL (ref 4.0–10.5)

## 2013-10-14 LAB — MRSA PCR SCREENING: MRSA BY PCR: NEGATIVE

## 2013-10-14 LAB — GLUCOSE, CAPILLARY
GLUCOSE-CAPILLARY: 164 mg/dL — AB (ref 70–99)
Glucose-Capillary: 143 mg/dL — ABNORMAL HIGH (ref 70–99)
Glucose-Capillary: 109 mg/dL — ABNORMAL HIGH (ref 70–99)
Glucose-Capillary: 132 mg/dL — ABNORMAL HIGH (ref 70–99)
Glucose-Capillary: 133 mg/dL — ABNORMAL HIGH (ref 70–99)

## 2013-10-14 LAB — MAGNESIUM: Magnesium: 1.4 mg/dL — ABNORMAL LOW (ref 1.5–2.5)

## 2013-10-14 LAB — URINE MICROSCOPIC-ADD ON

## 2013-10-14 LAB — I-STAT CG4 LACTIC ACID, ED: Lactic Acid, Venous: 0.43 mmol/L — ABNORMAL LOW (ref 0.5–2.2)

## 2013-10-14 LAB — PRO B NATRIURETIC PEPTIDE: PRO B NATRI PEPTIDE: 6255 pg/mL — AB (ref 0–450)

## 2013-10-14 LAB — LACTIC ACID, PLASMA: Lactic Acid, Venous: 1 mmol/L (ref 0.5–2.2)

## 2013-10-14 LAB — PROCALCITONIN

## 2013-10-14 LAB — CBG MONITORING, ED: Glucose-Capillary: 141 mg/dL — ABNORMAL HIGH (ref 70–99)

## 2013-10-14 LAB — PHOSPHORUS: Phosphorus: 4.2 mg/dL (ref 2.3–4.6)

## 2013-10-14 MED ORDER — BIOTENE DRY MOUTH MT LIQD
15.0000 mL | Freq: Two times a day (BID) | OROMUCOSAL | Status: DC
  Administered 2013-10-14 – 2013-10-16 (×4): 15 mL via OROMUCOSAL

## 2013-10-14 MED ORDER — ENOXAPARIN SODIUM 30 MG/0.3ML ~~LOC~~ SOLN
30.0000 mg | Freq: Every day | SUBCUTANEOUS | Status: DC
  Administered 2013-10-14 – 2013-10-16 (×4): 30 mg via SUBCUTANEOUS
  Filled 2013-10-14 (×4): qty 0.3

## 2013-10-14 MED ORDER — SODIUM CHLORIDE 0.9 % IV SOLN: INTRAVENOUS | Status: DC

## 2013-10-14 MED ORDER — SIMVASTATIN 20 MG PO TABS
20.0000 mg | ORAL_TABLET | Freq: Every evening | ORAL | Status: DC
  Administered 2013-10-14 – 2013-10-15 (×2): 20 mg via ORAL
  Filled 2013-10-14 (×4): qty 1

## 2013-10-14 MED ORDER — PIPERACILLIN-TAZOBACTAM IN DEX 2-0.25 GM/50ML IV SOLN
2.2500 g | Freq: Four times a day (QID) | INTRAVENOUS | Status: DC
  Administered 2013-10-14: 2.25 g via INTRAVENOUS
  Filled 2013-10-14 (×3): qty 50

## 2013-10-14 MED ORDER — SODIUM CHLORIDE 0.9 % IV BOLUS (SEPSIS)
500.0000 mL | Freq: Once | INTRAVENOUS | Status: AC
  Administered 2013-10-14: 500 mL via INTRAVENOUS

## 2013-10-14 MED ORDER — GLUCERNA SHAKE PO LIQD
237.0000 mL | Freq: Three times a day (TID) | ORAL | Status: DC
  Administered 2013-10-14 – 2013-10-16 (×6): 237 mL via ORAL

## 2013-10-14 MED ORDER — SODIUM CHLORIDE 0.9 % IV SOLN: 250.0000 mL | INTRAVENOUS | Status: AC | PRN

## 2013-10-14 MED ORDER — HEPARIN SODIUM (PORCINE) 5000 UNIT/ML IJ SOLN
5000.0000 [IU] | Freq: Three times a day (TID) | INTRAMUSCULAR | Status: DC
  Administered 2013-10-14: 5000 [IU] via SUBCUTANEOUS
  Filled 2013-10-14 (×3): qty 1

## 2013-10-14 MED ORDER — PANTOPRAZOLE SODIUM 40 MG PO TBEC
40.0000 mg | DELAYED_RELEASE_TABLET | Freq: Every day | ORAL | Status: DC
  Administered 2013-10-14 – 2013-10-16 (×3): 40 mg via ORAL
  Filled 2013-10-14 (×3): qty 1

## 2013-10-14 MED ORDER — FUROSEMIDE 10 MG/ML IJ SOLN
40.0000 mg | Freq: Once | INTRAMUSCULAR | Status: AC
  Administered 2013-10-14: 40 mg via INTRAVENOUS

## 2013-10-14 MED ORDER — VANCOMYCIN HCL 500 MG IV SOLR
500.0000 mg | INTRAVENOUS | Status: DC
  Administered 2013-10-14: 500 mg via INTRAVENOUS
  Filled 2013-10-14: qty 500

## 2013-10-14 MED ORDER — VANCOMYCIN HCL IN DEXTROSE 1-5 GM/200ML-% IV SOLN
1000.0000 mg | Freq: Once | INTRAVENOUS | Status: AC
Start: 2013-10-14 — End: 2013-10-14
  Administered 2013-10-14: 1000 mg via INTRAVENOUS
  Filled 2013-10-14: qty 200

## 2013-10-14 MED ORDER — HYDROCORTISONE NA SUCCINATE PF 100 MG IJ SOLR
50.0000 mg | Freq: Four times a day (QID) | INTRAMUSCULAR | Status: DC
  Administered 2013-10-14: 50 mg via INTRAVENOUS
  Filled 2013-10-14 (×5): qty 1

## 2013-10-14 MED ORDER — MEMANTINE HCL ER 28 MG PO CP24
28.0000 mg | ORAL_CAPSULE | Freq: Every day | ORAL | Status: DC
  Administered 2013-10-14 – 2013-10-16 (×3): 28 mg via ORAL
  Filled 2013-10-14 (×4): qty 28

## 2013-10-14 MED ORDER — ASPIRIN 300 MG RE SUPP: 300.0000 mg | RECTAL | Status: AC

## 2013-10-14 MED ORDER — FERROUS SULFATE 325 (65 FE) MG PO TABS
325.0000 mg | ORAL_TABLET | Freq: Two times a day (BID) | ORAL | Status: DC
  Administered 2013-10-14 – 2013-10-16 (×5): 325 mg via ORAL
  Filled 2013-10-14 (×9): qty 1

## 2013-10-14 MED ORDER — INSULIN ASPART 100 UNIT/ML ~~LOC~~ SOLN
0.0000 [IU] | Freq: Three times a day (TID) | SUBCUTANEOUS | Status: DC
  Administered 2013-10-14: 2 [IU] via SUBCUTANEOUS
  Administered 2013-10-14 – 2013-10-16 (×2): 1 [IU] via SUBCUTANEOUS

## 2013-10-14 MED ORDER — ASPIRIN 81 MG PO CHEW
324.0000 mg | CHEWABLE_TABLET | ORAL | Status: AC
  Administered 2013-10-14: 324 mg via ORAL
  Filled 2013-10-14: qty 4

## 2013-10-14 MED ORDER — FUROSEMIDE 10 MG/ML IJ SOLN
80.0000 mg | Freq: Once | INTRAMUSCULAR | Status: DC
  Filled 2013-10-14: qty 8

## 2013-10-14 MED ORDER — DOCUSATE SODIUM 100 MG PO CAPS
100.0000 mg | ORAL_CAPSULE | Freq: Two times a day (BID) | ORAL | Status: DC
  Administered 2013-10-14 – 2013-10-16 (×4): 100 mg via ORAL
  Filled 2013-10-14 (×8): qty 1

## 2013-10-14 MED ORDER — SODIUM CHLORIDE 0.9 % IV SOLN: 250.0000 mL | INTRAVENOUS | Status: DC | PRN

## 2013-10-14 MED ORDER — SERTRALINE HCL 50 MG PO TABS
50.0000 mg | ORAL_TABLET | Freq: Every day | ORAL | Status: DC
  Administered 2013-10-14 – 2013-10-16 (×3): 50 mg via ORAL
  Filled 2013-10-14 (×4): qty 1

## 2013-10-14 MED ORDER — INSULIN ASPART 100 UNIT/ML ~~LOC~~ SOLN: 0.0000 [IU] | SUBCUTANEOUS | Status: DC

## 2013-10-14 MED ORDER — PIPERACILLIN-TAZOBACTAM IN DEX 2-0.25 GM/50ML IV SOLN
2.2500 g | Freq: Once | INTRAVENOUS | Status: AC
Start: 2013-10-14 — End: 2013-10-14
  Administered 2013-10-14: 2.25 g via INTRAVENOUS
  Filled 2013-10-14: qty 50

## 2013-10-14 NOTE — Progress Notes (Signed)
ANTIBIOTIC CONSULT NOTE - INITIAL  Pharmacy Consult for Vancomycin and Zosyn  Indication: pneumonia  No Known Allergies  Patient Measurements: Height: 4' 11.84" (152 cm) Weight: 166 lb 7.2 oz (75.5 kg) IBW/kg (Calculated) : 45.14 Adjusted Body Weight: 60 kg  Vital Signs: Temp: 98.8 F (37.1 C) (07/09 2304) Temp src: Rectal (07/09 2304) BP: 94/52 mmHg (07/10 0514) Pulse Rate: 92 (07/10 0514) Intake/Output from previous day: 07/09 0701 - 07/10 0700 In: 1500 [I.V.:1500] Out: -  Intake/Output from this shift: Total I/O In: 1500 [I.V.:1500] Out: -   Labs:  Recent Labs  10/13/13 2318 10/14/13 0445  WBC 6.9 5.6  HGB 11.4* 10.2*  PLT 206 183  CREATININE 2.19* 2.09*   Estimated Creatinine Clearance: 16.8 ml/min (by C-G formula based on Cr of 2.09). No results found for this basename: VANCOTROUGH, Corlis Leak, VANCORANDOM, McEwen, GENTPEAK, GENTRANDOM, TOBRATROUGH, TOBRAPEAK, TOBRARND, AMIKACINPEAK, AMIKACINTROU, AMIKACIN,  in the last 72 hours   Microbiology: Recent Results (from the past 720 hour(s))  MRSA PCR SCREENING     Status: None   Collection Time    09/25/13  1:26 AM      Result Value Ref Range Status   MRSA by PCR NEGATIVE  NEGATIVE Final   Comment:            The GeneXpert MRSA Assay (FDA     approved for NASAL specimens     only), is one component of a     comprehensive MRSA colonization     surveillance program. It is not     intended to diagnose MRSA     infection nor to guide or     monitor treatment for     MRSA infections.    Medical History: Past Medical History  Diagnosis Date  . PE (pulmonary embolism) 2011  . Ovarian cancer   . Hyperlipidemia   . Rheumatoid arthritis(714.0)   . GERD (gastroesophageal reflux disease)   . Hypertension   . Anxiety   . Diabetes mellitus   . Diabetes mellitus   . Hx of ovarian cancer 06/21/2011    Medications:  Colace  Iron  Zestril  Namenda  Lopressor  Ntg  Prilosec  Zoloft  Zocor  Ultram     Assessment: 80 y.o. female with SOB, possible PNA, for empiric antibiotics.  Vancomycin 1 g IV given in ED at  0145  Goal of Therapy:  Vancomycin trough level 15-20 mcg/ml  Plan:  Vancomycin 500 mg IV q48h, next dose at 0800 Zosyn 2.25 g IV q6h   Lameisha Schuenemann, Bronson Curb 10/14/2013,5:57 AM

## 2013-10-14 NOTE — H&P (Addendum)
PULMONARY / CRITICAL CARE MEDICINE   Name: [name] MRN: [id number] DOB: 01/26/1924    ADMISSION DATE:  10/13/2013 CONSULTATION DATE:  10/14/2013  REFERRING MD :  EDP PRIMARY SERVICE: PCCM  CHIEF COMPLAINT:  Hypotension  BRIEF PATIENT DESCRIPTION: 80 y.o. F SNFresident brought to ED with SOB per SNF staff.  In ED, pt hypotensive despite 1.5L bolus.  PCCM was consulted.  SIGNIFICANT EVENTS / STUDIES:  Echo 6/22 >>> LVEF~ 30%, mild LVH, PA pressures 33. 7/10 - admit.  LINES / TUBES: Foley 7/10 >>>  CULTURES: Blood 7/10 >>> Urine 7/10 >>>  ANTIBIOTICS: Zosyn 7/10 >>> Vancomycin 7/10 >>>  HISTORY OF PRESENT ILLNESS:  Pt is encephalopathic; therefore, this HPI is obtained from chart review. [name] is a 80 y.o. F w PMH as outlined below, she resides in a SNF.  On 7/9, she was brought to the ED from SNF because SNF staff stated that pt seemed SOB.  Of note, pt does not endorse SOB during interview; however, answers questions with inconsistent responses due to her underlying dementia.  Upon EMS arrival, pt was hypotensive with SBP in 70's.  In ED, she remained hypotensive and was subsequently given IVF boluses.  After 1.5 L she was still hypotensive; hence, PCCM was consulted. During exam, pt denies fevers/chills/sweats, chest pain, SOB, cough, N/V/D, abd pain.  She is unable to tell me why she is in the hospital.  PAST MEDICAL HISTORY :  Past Medical History  Diagnosis Date  . PE (pulmonary embolism) 2011  . Ovarian cancer   . Hyperlipidemia   . Rheumatoid arthritis(714.0)   . GERD (gastroesophageal reflux disease)   . Hypertension   . Anxiety   . Diabetes mellitus   . Diabetes mellitus   . Hx of ovarian cancer 06/21/2011   Past Surgical History  Procedure Laterality Date  . Total hip arthroplasty     Prior to Admission medications   Medication Sig Start Date End Date Taking? Authorizing Provider  docusate sodium (COLACE) 100 MG capsule Take 100 mg by mouth 2 (two)  times daily.   Yes Historical Provider, MD  ferrous sulfate 325 (65 FE) MG EC tablet Take 325 mg by mouth 2 (two) times daily.   Yes Historical Provider, MD  lisinopril (PRINIVIL,ZESTRIL) 5 MG tablet Take 1 tablet (5 mg total) by mouth daily. 09/30/13  Yes Charlynne Cousins, MD  Memantine HCl ER (NAMENDA XR) 28 MG CP24 Take 28 mg by mouth daily. For dementia   Yes Historical Provider, MD  metoprolol tartrate (LOPRESSOR) 12.5 mg TABS tablet Take 0.5 tablets (12.5 mg total) by mouth 2 (two) times daily. 09/30/13  Yes Charlynne Cousins, MD  nitroGLYCERIN (NITROSTAT) 0.4 MG SL tablet Place 0.4 mg under the tongue every 5 (five) minutes as needed for chest pain.   Yes Historical Provider, MD  nystatin (MYCOSTATIN) powder Apply topically 2 (two) times daily.   Yes Historical Provider, MD  omeprazole (PRILOSEC) 20 MG capsule Take 20 mg by mouth every morning. For esophageal reflux   Yes Historical Provider, MD  promethazine (PHENERGAN) 25 MG tablet Take 25 mg by mouth every 6 (six) hours as needed for nausea or vomiting.   Yes Historical Provider, MD  sertraline (ZOLOFT) 50 MG tablet Take 50 mg by mouth daily.   Yes Historical Provider, MD  simvastatin (ZOCOR) 20 MG tablet Take 20 mg by mouth every evening. For hyperlipidemia   Yes Historical Provider, MD  traMADol (ULTRAM) 50 MG tablet Take 1  tablet (50 mg total) by mouth every 6 (six) hours as needed for moderate pain. 09/30/13  Yes Gerlene Fee, NP  vitamin E 200 UNIT capsule Take 200 Units by mouth every evening.    Yes Historical Provider, MD   No Known Allergies  FAMILY HISTORY:  Family History  Problem Relation Age of Onset  . Diabetes type II     SOCIAL HISTORY:  reports that she has quit smoking. She has quit using smokeless tobacco. She reports that she does not drink alcohol or use illicit drugs.  REVIEW OF SYSTEMS:   Constitutional: denies weight loss, weight gain, night sweats, fevers, chills, fatigue, weakness.  HEENT: denies  headaches, sore throat, sneezing, nasal congestion, post nasal drip, difficulty swallowing, tooth/dental problems, visual complaints, visual changes, ear aches. Neuro: denies  difficulty with speech, weakness, numbness, ataxia. CV:  denies chest pain, orthopnea, PND, swelling in lower extremities, dizziness, palpitations, syncope.  Resp: denies cough, hemoptysis, dyspnea, wheezing. GI  denies heartburn, indigestion, abdominal pain, nausea, vomiting, diarrhea, constipation, change in bowel habits, loss of appetite, hematemesis, melena, hematochezia.  GU: denies dysuria, change in color of urine, urgency or frequency, flank pain, hematuria. MSK: denies joint pain or swelling, decreased range of motion. Psych: denies change in mood or affect, depression, anxiety, suicidal ideations, homicidal ideations. Skin: denies rash, itching, bruising.   SUBJECTIVE:  Unable to tell me where she is or what is wrong.  Remains hypotensive with SBP in 90's.  VITAL SIGNS: Temp:  [98.8 F (37.1 C)] 98.8 F (37.1 C) (07/09 2304) Pulse Rate:  [42-70] 58 (07/10 0344) Resp:  [14-28] 14 (07/10 0344) BP: (82-104)/(38-62) 99/38 mmHg (07/10 0344) SpO2:  [94 %-100 %] 100 % (07/10 0323) HEMODYNAMICS:   VENTILATOR SETTINGS:   INTAKE / OUTPUT: Intake/Output     07/09 0701 - 07/10 0700   I.V. 1500   Total Intake 1500   Net +1500         PHYSICAL EXAMINATION: General: Elderly chronically ill appearing female, resting in bed, in NAD. Neuro: No oriented to place or time.  Able to tell me who she is. HEENT: [place]/AT. PERRL, sclerae anicteric. Cardiovascular: RRR, no M/R/G.  Lungs: Respirations even and unlabored.  CTA bilaterally, No W/R/R. Abdomen: BS x 4, soft, NT/ND.  Musculoskeletal: No gross deformities, no edema.  Skin: Intact, warm, no rashes.    LABS:  CBC  Recent Labs Lab 10/13/13 2318  WBC 6.9  HGB 11.4*  HCT 32.9*  PLT 206   Coag's No results found for this basename: APTT, INR,  in the  last 168 hours BMET  Recent Labs Lab 10/13/13 2318  NA 123*  K 5.8*  CL 89*  CO2 16*  BUN 74*  CREATININE 2.19*  GLUCOSE 143*   Electrolytes  Recent Labs Lab 10/13/13 2318  CALCIUM 9.0   Sepsis Markers  Recent Labs Lab 10/14/13 0319  LATICACIDVEN 0.43*   ABG  Recent Labs Lab 10/14/13 0055  PHART 7.314*  PCO2ART 36.0  PO2ART 82.0   Liver Enzymes No results found for this basename: AST, ALT, ALKPHOS, BILITOT, ALBUMIN,  in the last 168 hours Cardiac Enzymes  Recent Labs Lab 10/13/13 2318  PROBNP 6255.0*   Glucose No results found for this basename: GLUCAP,  in the last 168 hours  Imaging Dg Chest Portable 1 View  10/14/2013   CLINICAL DATA:  Shortness of breath.  EXAM: PORTABLE CHEST - 1 VIEW  COMPARISON:  09/26/2013  FINDINGS: Shallow inspiration. Borderline heart size with  mild vascular congestion. No definite edema or airspace disease. Colonic interposition under the right hemidiaphragm. Degenerative changes in the shoulders. No change since prior study.  IMPRESSION: Shallow inspiration.  Mild vascular congestion without change.   Electronically Signed   By: Lucienne Capers M.D.   On: 10/14/2013 00:09    ASSESSMENT / PLAN:  PULMONARY A: No acute issues Mild pulmonary edema Protecting airway P: - Supplemental O2 as needed to maintain SpO2 > 92%. - Would like to give lasix, but BP prohibitive. - CXR in AM. - Abx/cultures per ID section.  CARDIOVASCULAR A:  Sepsis - no evidence of shock at this point. Hx HLD Hx HTN Systolic CHF - echo from 2/87 showed LVEF~ 30%. Elevated BNP P:  - If hypotension persists, will place TLC to assess CVP's. - Goal MAP > 65. - Trend lactate - TTE. - Hold outpatient lisinopril, lopressor, nitro. - Continue outpatient simvastatin. - Hypotension prohibitive of diuresis at this time.  RENAL A:   AG metabolic acidosis - uremia. Hyponatremia - appears chronic.  ? CHF/volume overload related. Hypokalemia -  received kayexalate in ED. No evidence of EKG changes. AKI P:   - NS @ 75. - BMP in AM.  GASTROINTESTINAL A:   Nutrition GERD Constipation P:   - NPO except sips with meds. - Continue outpatient PPI, Colace.  HEMATOLOGIC A:  Anemia - chronic. P:  - VTE Proph:  Heparin / SCD's. - CBC in AM.  INFECTIOUS A:   Sepsis - unclear source at this point though UA suggestive of possible mild UTI. P:   - Cultures and antibiotics as above, narrow abx as cultures result. - PCT Algorithm to limit abx exposure.  ENDOCRINE A:   DM - ? Diet controlled as med rec does not show outpatient meds. P:   - CBG's q4hr. - SSI if needed.  NEUROLOGIC A:   Dementia Anxiety P:   - Continue outpatient Namenda, Zoloft. - Monitor.   Montey Hora, [place] [facility] Pgr: [phone]  or [phone]  Septic shock, unknown source at this point, likely UTI.  Cortisol was 12.3 on 6/15, will start stress dose steroids and repeat cortisol level, if BP remains low may need to place TLC for pressors but will hold off that for now, will f/u on culture and adjust abx accordingly.  Will KVO IVF given low lactate and patient's pulmonary edema.  I have personally obtained a history, examined the patient, evaluated laboratory and imaging results, formulated the assessment and plan and placed orders.  CRITICAL CARE: The patient is critically ill with multiple organ systems failure and requires high complexity decision making for assessment and support, frequent evaluation and titration of therapies, application of advanced monitoring technologies and extensive interpretation of multiple databases. Critical Care Time devoted to patient care services described in this note is 45 minutes.   Rush Farmer, M.D. [facility] Pulmonary/Critical Care Medicine. Pager: [phone]. After hours pager: [phone].  10/14/2013, 4:20 AM

## 2013-10-14 NOTE — Progress Notes (Signed)
Patient is confused. Alert and oriented to self only. Patient has been yelling "HELP" since she arrived to the floor. Patient is calm and quiet when someone is in the room with her. Denies pain and discomfort.

## 2013-10-14 NOTE — Progress Notes (Signed)
Echocardiogram 2D Echocardiogram has been performed.  Kiosha Buchan 10/14/2013, 10:01 AM

## 2013-10-14 NOTE — ED Notes (Addendum)
Dr. Cheri Guppy aware of hypotension, states will change Lasix dosage to 40mg  IV.

## 2013-10-14 NOTE — Progress Notes (Signed)
UR Completed.  Myanna Ziesmer Jane [phone] 10/14/2013  

## 2013-10-14 NOTE — Progress Notes (Addendum)
INITIAL NUTRITION ASSESSMENT  DOCUMENTATION CODES Per approved criteria  -Obesity Unspecified   INTERVENTION:  Glucerna Shake po TID, each supplement provides 220 kcal and 10 grams of protein  NUTRITION DIAGNOSIS: Inadequate oral intake related to poor appetite as evidenced by 6 lb weight loss over the past month.   Goal: Intake to meet >90% of estimated nutrition needs.  Monitor:  PO intake, labs, weight trend.   Reason for Assessment: MST  80 y.o. female  Admitting Dx: Hypotension  ASSESSMENT: 80 y.o. F SNFresident brought to ED with SOB per SNF staff. In ED, pt hypotensive despite 1.5L bolus.   Patient c/o difficulty breathing. Discussed patient in ICU rounds today. Patient is confused. Eating poorly per RN. Patient thinks she has lost weight, but she is unsure of usual weight or how much she has lost. She says she is not interested in eating. She has a lot of loose skin around her midsection. Suspect intake was poor PTA, given recent weight loss. Patient is at nutrition risk given recent weight loss and poor intake. Needs adequate intake to support healing and prevent further skin breakdown.  Nutrition Focused Physical Exam:  Subcutaneous Fat:  Orbital Region: WNL Upper Arm Region: WNL Thoracic and Lumbar Region: NA  Muscle:  Temple Region: mild-moderate depletion Clavicle Bone Region: moderate depletion Clavicle and Acromion Bone Region: moderate depletion Scapular Bone Region: NA Dorsal Hand: WNL Patellar Region: WNL Anterior Thigh Region: WNL Posterior Calf Region: WNL  Edema: None  Height: Ht Readings from Last 1 Encounters:  10/14/13 4' 11.84" (1.52 m)    Weight: Wt Readings from Last 1 Encounters:  10/14/13 166 lb 7.2 oz (75.5 kg)    Ideal Body Weight: 45.5 kg  % Ideal Body Weight: 166%  Wt Readings from Last 10 Encounters:  10/14/13 166 lb 7.2 oz (75.5 kg)  09/30/13 166 lb 7.2 oz (75.5 kg)  09/09/13 172 lb (78.019 kg)  08/12/13 169 lb  (76.658 kg)  07/22/13 165 lb (74.844 kg)  06/14/13 181 lb (82.101 kg)  06/21/11 182 lb 5.1 oz (82.7 kg)  05/22/11 190 lb (86.183 kg)    Usual Body Weight: 172 lb  % Usual Body Weight: 97%  BMI:  Body mass index is 32.68 kg/(m^2). obesity, class 1  Estimated Nutritional Needs: Kcal: 1350-1550 Protein: 75-90 gm Fluid: 1.4-1.6 L  Skin: stage 2 pressure ulcer to right buttocks  Diet Order: Carb Control  EDUCATION NEEDS: -Education not appropriate at this time   Intake/Output Summary (Last 24 hours) at 10/14/13 1057 Last data filed at 10/14/13 1000  Gross per 24 hour  Intake   2125 ml  Output    300 ml  Net   1825 ml    Last BM: none documented since admission   Labs:   Recent Labs Lab 10/13/13 2318 10/14/13 0445  NA 123* 126*  K 5.8* 5.3  CL 89* 94*  CO2 16* 16*  BUN 74* 69*  CREATININE 2.19* 2.09*  CALCIUM 9.0 8.2*  MG  --  1.4*  PHOS  --  4.2  GLUCOSE 143* 145*    CBG (last 3)   Recent Labs  10/14/13 0502 10/14/13 0623 10/14/13 0812  GLUCAP 141* 143* 109*    Scheduled Meds: . antiseptic oral rinse  15 mL Mouth Rinse BID  . docusate sodium  100 mg Oral BID  . enoxaparin (LOVENOX) injection  30 mg Subcutaneous Daily  . ferrous sulfate  325 mg Oral BID WC  . insulin aspart  0-9  Units Subcutaneous TID WC  . Memantine HCl ER  28 mg Oral Daily  . pantoprazole  40 mg Oral Daily  . sertraline  50 mg Oral Daily  . simvastatin  20 mg Oral QPM    Continuous Infusions:   Past Medical History  Diagnosis Date  . PE (pulmonary embolism) 2011  . Ovarian cancer   . Hyperlipidemia   . Rheumatoid arthritis(714.0)   . GERD (gastroesophageal reflux disease)   . Hypertension   . Anxiety   . Diabetes mellitus   . Diabetes mellitus   . Hx of ovarian cancer 06/21/2011    Past Surgical History  Procedure Laterality Date  . Total hip arthroplasty      Molli Barrows, RD, LDN, [place] Pager [phone] After Hours Pager [phone]

## 2013-10-14 NOTE — ED Notes (Signed)
Phlebotomy at bedside to draw blood cultures.

## 2013-10-14 NOTE — Progress Notes (Addendum)
Name: [name] MRN: [id number] DOB: [birth year]    PCCM PROGRESS NOTE  ADMISSION DATE:  10/13/2013 CONSULTATION DATE:  10/14/13  REFERRING MD :  EDP PRIMARY SERVICE:  PCCM >> [facility]  CHIEF COMPLAINT:  Hypotension  BRIEF PATIENT DESCRIPTION: 80 y.o. F SNF resident [facility]) to ED with SOB. Found to be hypotensive in ED, covered initially with IVF and antibiotics with concern for sepsis. PCCM was consulted. However no evidence of infection, suspected poor PO, hypovolemia as etiology of hypotension.  SIGNIFICANT EVENTS / STUDIES:  7/10 - Admit, concern severe sepsis, hypotensive. 7/10 - Stable low BP, no O2 req, PCT < 0.10, Lactic Acid 0.43 >> 1.0  LINES / TUBES: Foley 7/10 >>>  CULTURES: Blood 7/10 >>> Urine 7/10 >>>  ANTIBIOTICS: Zosyn 7/10 >> 7/10 - Discontinued Vancomycin 7/10 >> 7/10 - Discontinued  SUBJECTIVE: Patient reports some shortness of breath, otherwise denies chest pain, any other pain, fever/chills, dysuria, or other complaints. Note patient has chronic dementia.  VITAL SIGNS: Temp:  [97.8 F (36.6 C)-98.8 F (37.1 C)] 97.8 F (36.6 C) (07/10 0818) Pulse Rate:  [42-92] 50 (07/10 1030) Resp:  [14-28] 23 (07/10 1030) BP: (64-107)/(37-62) 96/41 mmHg (07/10 1030) SpO2:  [94 %-100 %] 100 % (07/10 1030) Weight:  [166 lb 7.2 oz (75.5 kg)] 166 lb 7.2 oz (75.5 kg) (07/10 0514)  PHYSICAL EXAMINATION: General:  Chronically ill elderly female, sitting up resting in bed, NAD but occasionally yells out for assistance Neuro:  Awake, alert, oriented (person only), follows commands, grossly non-focal Cardiovascular: RRR, no murmurs Lungs:  CTAB without wheezing, crackles, or rhonchi. No significant resp distress. Abdomen:  Soft, NTND, +active BS Skin:  Warm, intact   Recent Labs Lab 10/13/13 2318 10/14/13 0445  NA 123* 126*  K 5.8* 5.3  CL 89* 94*  CO2 16* 16*  BUN 74* 69*  CREATININE 2.19* 2.09*  GLUCOSE 143* 145*    Recent Labs Lab  10/13/13 2318 10/14/13 0445  HGB 11.4* 10.2*  HCT 32.9* 30.2*  WBC 6.9 5.6  PLT 206 183   Dg Chest Portable 1 View  10/14/2013   CLINICAL DATA:  Shortness of breath.  EXAM: PORTABLE CHEST - 1 VIEW  COMPARISON:  09/26/2013  FINDINGS: Shallow inspiration. Borderline heart size with mild vascular congestion. No definite edema or airspace disease. Colonic interposition under the right hemidiaphragm. Degenerative changes in the shoulders. No change since prior study.  IMPRESSION: Shallow inspiration.  Mild vascular congestion without change.   Electronically Signed   By: Lucienne Capers M.D.   On: 10/14/2013 00:09    ASSESSMENT / PLAN:  Hypotension, likely due to multifactorial with decreased PO, hypovolemia Unlikely sepsis - initial concern, now determined unlikely given no clear infectious source and PCT <0.10 AKI - suspected due to hypotension / hypovolemia Chronic Systolic CHF, without exacerbation - last ECHO 6/22 with LVEF 30% Elevated BNP (6255), without significant pulmonary edema Hx HTN, HLD AG metabolic acidosis, likely secondary to uremia Hyponatremia, chronic Hyperkalemia - s/p kayexalate Chronic Dementia, Anxiety GERD Constipation Anemia, chronic Hx DM, diet controlled, no current medications   Plan: - Transfer out of ICU to telemetry floor - Close monitoring BP given hypotension, s/p NS 500cc bolus - Consider stress dose steroids if BP remains refractory, cortisol normal range - Hold home anti-HTN meds - Encourage inc PO, KVO IVF. May provide repeat bolus or gentle hydration if continued poor PO - Repeat CXR in AM to re-eval for any evidence of developing edema -  Discontinue antibiotics (Vanc, Zosyn) given low PCT <0.10 and lactate 0.4 - 1 - f/u fever curve, blood / urine cultures (collected 7/10). If concern for infection may trend PCT. - Continue outpatient Namenda, Zoloft  Transfer patient to telemetry to primary management of [facility], discussed  with Dr. Thereasa Solo.  Nobie Putnam, DO [place], PGY-2  PCCM ATTENDING: I have interviewed and examined the patient and reviewed the database. I have formulated the assessment and plan as reflected in the note above with amendments made by me.   I spoke in detail with pt's granddaughter and explained the limitations of what we have to offer to the pt with advanced age and advanced dementia. I strongly encouraged that we consider DNR in event of cardiac arrest and DNI in event of resp arrest. The pt's daughter is limited by knee pain and has difficulty coming to hospital. There might be a role for Palliative Care medicine  Merton Border, MD ; [facility] service Mobile [phone].  After 5:30 PM or weekends, call [phone]  10/14/2013, 11:48 AM

## 2013-10-15 DIAGNOSIS — E871 Hypo-osmolality and hyponatremia: Secondary | ICD-10-CM

## 2013-10-15 DIAGNOSIS — E1149 Type 2 diabetes mellitus with other diabetic neurological complication: Secondary | ICD-10-CM

## 2013-10-15 DIAGNOSIS — E43 Unspecified severe protein-calorie malnutrition: Secondary | ICD-10-CM | POA: Diagnosis present

## 2013-10-15 DIAGNOSIS — F03918 Unspecified dementia, unspecified severity, with other behavioral disturbance: Secondary | ICD-10-CM

## 2013-10-15 DIAGNOSIS — R578 Other shock: Secondary | ICD-10-CM

## 2013-10-15 DIAGNOSIS — N179 Acute kidney failure, unspecified: Secondary | ICD-10-CM | POA: Diagnosis present

## 2013-10-15 DIAGNOSIS — R571 Hypovolemic shock: Secondary | ICD-10-CM | POA: Diagnosis present

## 2013-10-15 DIAGNOSIS — E440 Moderate protein-calorie malnutrition: Secondary | ICD-10-CM

## 2013-10-15 DIAGNOSIS — F0391 Unspecified dementia with behavioral disturbance: Secondary | ICD-10-CM

## 2013-10-15 LAB — BASIC METABOLIC PANEL
Anion gap: 16 — ABNORMAL HIGH (ref 5–15)
BUN: 56 mg/dL — AB (ref 6–23)
CO2: 16 mEq/L — ABNORMAL LOW (ref 19–32)
CREATININE: 1.34 mg/dL — AB (ref 0.50–1.10)
Calcium: 8.5 mg/dL (ref 8.4–10.5)
Chloride: 98 mEq/L (ref 96–112)
GFR calc non Af Amer: 34 mL/min — ABNORMAL LOW (ref >90)
GFR, EST AFRICAN AMERICAN: 40 mL/min — AB (ref >90)
Glucose, Bld: 86 mg/dL (ref 70–99)
Potassium: 4.8 mEq/L (ref 3.7–5.3)
Sodium: 130 mEq/L — ABNORMAL LOW (ref 137–147)

## 2013-10-15 LAB — GLUCOSE, CAPILLARY
GLUCOSE-CAPILLARY: 104 mg/dL — AB (ref 70–99)
Glucose-Capillary: 88 mg/dL (ref 70–99)
Glucose-Capillary: 115 mg/dL — ABNORMAL HIGH (ref 70–99)
Glucose-Capillary: 109 mg/dL — ABNORMAL HIGH (ref 70–99)

## 2013-10-15 MED ORDER — SODIUM CHLORIDE 0.9 % IV SOLN: 250.0000 mL | INTRAVENOUS | Status: AC | PRN

## 2013-10-15 MED ORDER — QUETIAPINE FUMARATE 25 MG PO TABS
25.0000 mg | ORAL_TABLET | Freq: Every day | ORAL | Status: DC
  Administered 2013-10-15: 25 mg via ORAL
  Filled 2013-10-15 (×3): qty 1

## 2013-10-15 MED ORDER — MAGNESIUM OXIDE 400 (241.3 MG) MG PO TABS
400.0000 mg | ORAL_TABLET | Freq: Two times a day (BID) | ORAL | Status: AC
Start: 2013-10-15 — End: 2013-10-15
  Administered 2013-10-15 (×2): 400 mg via ORAL
  Filled 2013-10-15 (×2): qty 1

## 2013-10-15 NOTE — Clinical Social Work Note (Signed)
CSW continues to follow this patient. CSW made aware by RN Aniceto Boss) daughter has contacted her and made her aware that she does not want patient to d/c to facility until she is able to speak with director of nursing regarding nature in which patient was found at facility. RN to make MD aware.   CSW spoke with Mariane Masters Soil scientist) at [facility] who reported Mudlogger of nursing to follow-up with CSW regarding patient return to facility. CSW pending telephone call from [place]. Of Nursing. CSW made aware by RN, patient's d/c disposition is deferred at this time. CSW to continue to follow.  [place], [place] Weekend Clinical Social Worker [phone]

## 2013-10-15 NOTE — Clinical Social Work Note (Signed)
CSW made aware by RN patient ready for d/c to [facility] CSW met with patient who is agreeable to d/c plan. CSW contacted [facility] and spoke with supervisor Mariane Masters, who reported he must speak with Nursing Director regarding patient return to facility. CSW contacted patient's daughter and granddaughter and provided d/c planning update. CSW prepared d/c packet and placed in patient's shadow chart. CSW pending decision to from [facility] to accept patient on this date. CSW made RN Aniceto Boss) aware.  [place], [place] Weekend Clinical Social Worker [phone]

## 2013-10-15 NOTE — Discharge Summary (Addendum)
Physician Discharge Summary  [name]:[id number] DOB: 12/31/1923 DOA: 10/13/2013  PCP: Claudia Spurling, MD  Admit date: 10/13/2013 Discharge date: 10/15/2013  Time spent: 40 minutes  Recommendations for Outpatient Follow-up:  1. Follow up with PCP in 4 weeks. 2. Discuss goals of care with family.  Discharge Diagnoses:  Active Problems:   DM (diabetes mellitus), type 2 with neurological complications   Dementia with behavioral disturbance   Hyponatremia   Altered mental status   Pulmonary edema   Hypovolemic shock   AKI (acute kidney injury)   Protein-calorie malnutrition, severe   Discharge Condition: guarded  Diet recommendation: Regular  Filed Weights   10/14/13 0514 10/15/13 0709  Weight: 75.5 kg (166 lb 7.2 oz) 74.38 kg (163 lb 15.7 oz)    History of present illness:  80 y.o. F SNFresident brought to ED with SOB per SNF staff. In ED, pt hypotensive despite 1.5L bolus. PCCM was consulted.   Hospital Course:  Hypovolemic shock/Hyponatremi/AKI:  - Due to decrease oral intake and diuretics.  - Encourage oral intake. Afebrile, no leukocytosis.  - Hyponatremia, hypotension and acute renal failure resolved with IV fluids. Patient is unable to maintain adequate fluid intake.  - Family with  unreasonable expectation about her mom, she was continued IV fluids she wants her to be a full coated do anything in our power to keep her alive. Even if that means being bringing her into the hospital every time and doing every test needed. - She will not go on an ACE inhibitor or diuretic. As she's not able to adequately hydrate herself. I have discussed with the family and they have on reasonable expectation about her mother. - She will continue on the metoprolol and the aspirin.  Dementia with behavioral disturbance  - Continue outpatient Namenda, Zoloft  Severe protein caloric malnutrition: Patient does not have adequate caloric intake. She doesn't even want to drink her  Ensure. Please read above for further details.  Procedures:  CXR  Consultations:  PCCM  Discharge Exam: Filed Vitals:   10/15/13 0709  BP: 114/58  Pulse: 68  Temp: 97.6 F (36.4 C)  Resp: 18    General: A&O x3 Cardiovascular: RRR Respiratory: good air movement CTA B/L  Discharge Instructions You were cared for by a hospitalist during your hospital stay. If you have any questions about your discharge medications or the care you received while you were in the hospital after you are discharged, you can call the unit and asked to speak with the hospitalist on call if the hospitalist that took care of you is not available. Once you are discharged, your primary care physician will handle any further medical issues. Please note that NO REFILLS for any discharge medications will be authorized once you are discharged, as it is imperative that you return to your primary care physician (or establish a relationship with a primary care physician if you do not have one) for your aftercare needs so that they can reassess your need for medications and monitor your lab values.      Discharge Instructions   Diet - low sodium heart healthy    Complete by:  As directed      Increase activity slowly    Complete by:  As directed             Medication List    STOP taking these medications       lisinopril 5 MG tablet  Commonly known as:  PRINIVIL,ZESTRIL  vitamin E 200 UNIT capsule      TAKE these medications       docusate sodium 100 MG capsule  Commonly known as:  COLACE  Take 100 mg by mouth 2 (two) times daily.     ferrous sulfate 325 (65 FE) MG EC tablet  Take 325 mg by mouth 2 (two) times daily.     metoprolol tartrate 12.5 mg Tabs tablet  Commonly known as:  LOPRESSOR  Take 0.5 tablets (12.5 mg total) by mouth 2 (two) times daily.     NAMENDA XR 28 MG Cp24  Generic drug:  Memantine HCl ER  Take 28 mg by mouth daily. For dementia     nitroGLYCERIN 0.4 MG SL tablet   Commonly known as:  NITROSTAT  Place 0.4 mg under the tongue every 5 (five) minutes as needed for chest pain.     nystatin powder  Commonly known as:  MYCOSTATIN  Apply topically 2 (two) times daily.     omeprazole 20 MG capsule  Commonly known as:  PRILOSEC  Take 20 mg by mouth every morning. For esophageal reflux     promethazine 25 MG tablet  Commonly known as:  PHENERGAN  Take 25 mg by mouth every 6 (six) hours as needed for nausea or vomiting.     sertraline 50 MG tablet  Commonly known as:  ZOLOFT  Take 50 mg by mouth daily.     simvastatin 20 MG tablet  Commonly known as:  ZOCOR  Take 20 mg by mouth every evening. For hyperlipidemia     traMADol 50 MG tablet  Commonly known as:  ULTRAM  Take 1 tablet (50 mg total) by mouth every 6 (six) hours as needed for moderate pain.       No Known Allergies    The results of significant diagnostics from this hospitalization (including imaging, microbiology, ancillary and laboratory) are listed below for reference.    Significant Diagnostic Studies: Dg Chest Portable 1 View  10/14/2013   CLINICAL DATA:  Shortness of breath.  EXAM: PORTABLE CHEST - 1 VIEW  COMPARISON:  09/26/2013  FINDINGS: Shallow inspiration. Borderline heart size with mild vascular congestion. No definite edema or airspace disease. Colonic interposition under the right hemidiaphragm. Degenerative changes in the shoulders. No change since prior study.  IMPRESSION: Shallow inspiration.  Mild vascular congestion without change.   Electronically Signed   By: Lucienne Capers M.D.   On: 10/14/2013 00:09   Dg Chest Port 1 View  09/26/2013   CLINICAL DATA:  Follow-up CHF  EXAM: PORTABLE CHEST - 1 VIEW  COMPARISON:  09/24/2013  FINDINGS: Cardiac shadow is stable. The overall inspiratory effort is poor but stable. Degenerative changes are noted above shoulder joints bilaterally. Diffuse interstitial changes as well as vascular congestion are seen  IMPRESSION: Stable  vascular congestion   Electronically Signed   By: Willistine Catalina M.D.   On: 09/26/2013 10:11   Dg Chest Portable 1 View  09/24/2013   CLINICAL DATA:  Shortness of breath.  EXAM: PORTABLE CHEST - 1 VIEW  COMPARISON:  Chest radiograph performed 08/28/2011  FINDINGS: The lungs are hypoexpanded. Vascular congestion is noted. Mild bilateral airspace opacities may reflect atelectasis or possibly mild pneumonia. No definite pleural effusion or pneumothorax is seen.  The cardiomediastinal silhouette is borderline normal in size. No acute osseous abnormalities are identified. Degenerative change is noted at the right glenohumeral joint, with chronic superior subluxation of both humeral heads, reflecting underlying chronic rotator cuff tears.  IMPRESSION: Lungs hypoexpanded. Vascular congestion seen. Mild bilateral airspace opacities may reflect atelectasis or possibly mild pneumonia, though difficult to fully assess given lung hypoexpansion.   Electronically Signed   By: Garald Balding M.D.   On: 09/24/2013 23:10    Microbiology: Recent Results (from the past 240 hour(s))  CULTURE, BLOOD (ROUTINE X 2)     Status: None   Collection Time    10/14/13 12:50 AM      Result Value Ref Range Status   Specimen Description BLOOD RIGHT ARM   Final   Special Requests BOTTLES DRAWN AEROBIC AND ANAEROBIC 10CC EACH   Final   Culture  Setup Time     Final   Value: 10/14/2013 08:54     Performed at [facility]   Culture     Final   Value: GRAM POSITIVE COCCI IN CLUSTERS     Note: Gram Stain Report Called to,Read Back By and Verified With: ANTIONETTE AT 0145 10/15/13 BY SNOLO     Performed at [facility]   Report Status PENDING   Incomplete  MRSA PCR SCREENING     Status: None   Collection Time    10/14/13  6:31 AM      Result Value Ref Range Status   MRSA by PCR NEGATIVE  NEGATIVE Final   Comment:            The GeneXpert MRSA Assay (FDA     approved for NASAL specimens     only), is one component  of a     comprehensive MRSA colonization     surveillance program. It is not     intended to diagnose MRSA     infection nor to guide or     monitor treatment for     MRSA infections.     Labs: Basic Metabolic Panel:  Recent Labs Lab 10/13/13 2318 10/14/13 0445 10/15/13 0311  NA 123* 126* 130*  K 5.8* 5.3 4.8  CL 89* 94* 98  CO2 16* 16* 16*  GLUCOSE 143* 145* 86  BUN 74* 69* 56*  CREATININE 2.19* 2.09* 1.34*  CALCIUM 9.0 8.2* 8.5  MG  --  1.4*  --   PHOS  --  4.2  --    Liver Function Tests: No results found for this basename: AST, ALT, ALKPHOS, BILITOT, PROT, ALBUMIN,  in the last 168 hours No results found for this basename: LIPASE, AMYLASE,  in the last 168 hours No results found for this basename: AMMONIA,  in the last 168 hours CBC:  Recent Labs Lab 10/13/13 2318 10/14/13 0445  WBC 6.9 5.6  NEUTROABS 4.6  --   HGB 11.4* 10.2*  HCT 32.9* 30.2*  MCV 75.5* 76.5*  PLT 206 183   Cardiac Enzymes: No results found for this basename: CKTOTAL, CKMB, CKMBINDEX, TROPONINI,  in the last 168 hours BNP: BNP (last 3 results)  Recent Labs  09/24/13 2250 10/13/13 2318  PROBNP 1029.0* 6255.0*   CBG:  Recent Labs Lab 10/14/13 0812 10/14/13 1229 10/14/13 1555 10/14/13 2119 10/15/13 0735  GLUCAP 109* 132* 164* 133* 88       Signed:  FELIZ ORTIZ, Carlo Lorson  [facility] 10/15/2013, 8:59 AM

## 2013-10-15 NOTE — Progress Notes (Signed)
[facility] PROGRESS NOTE  Filed Weights   10/14/13 0514 10/15/13 0709  Weight: 75.5 kg (166 lb 7.2 oz) 74.38 kg (163 lb 15.7 oz)        Intake/Output Summary (Last 24 hours) at 10/15/13 9169 Last data filed at 10/14/13 2316  Gross per 24 hour  Intake    470 ml  Output   1500 ml  Net  -1030 ml     Assessment/Plan: Hypovolemic shock/Hyponatremi/AKI: - Due to decrease oral intake and diuretics. - Encourage oral intake. Afebrile, no leukocytosis. - Started on IV fluids yesterday hyponatremia improving along with AKI. - Cont IV fluids. - Family but on reasonable expectation about her mom she was continued IV fluids she wants her to be a full coated do anything in our power to keep her alive. - SNF in am.  Dementia with behavioral disturbance - Continue outpatient Namenda, Zoloft  Code Status: full Family Communication: none  Disposition Plan: inpatient   Consultants:  PCCM  Procedures: ECHO:   Antibiotics: Zosyn 7/10 >> 7/10 - Discontinued  Vancomycin 7/10 >> 7/10 - Discontinued   HPI/Subjective: Non verbal  Objective: Filed Vitals:   10/14/13 1900 10/14/13 2257 10/15/13 0220 10/15/13 0709  BP: 116/53 118/53 116/60 114/58  Pulse: 62 67 66 68  Temp: 97.3 F (36.3 C) 97.6 F (36.4 C) 97.8 F (36.6 C) 97.6 F (36.4 C)  TempSrc: Axillary Axillary Axillary Axillary  Resp: 24 22 20 18   Height:      Weight:    74.38 kg (163 lb 15.7 oz)  SpO2: 94% 100% 100% 100%     Exam:  General: in no acute distress.  HEENT: No bruits, no goiter. -JVD Heart: Regular rate and rhythm, without murmurs, rubs, gallops.  Lungs: Good air movement, clear Abdomen: Soft, nontender, nondistended, positive bowel sounds.     Data Reviewed: Basic Metabolic Panel:  Recent Labs Lab 10/13/13 2318 10/14/13 0445 10/15/13 0311  NA 123* 126* 130*  K 5.8* 5.3 4.8  CL 89* 94* 98  CO2 16* 16* 16*  GLUCOSE 143* 145* 86  BUN 74* 69* 56*  CREATININE 2.19* 2.09* 1.34*    CALCIUM 9.0 8.2* 8.5  MG  --  1.4*  --   PHOS  --  4.2  --    Liver Function Tests: No results found for this basename: AST, ALT, ALKPHOS, BILITOT, PROT, ALBUMIN,  in the last 168 hours No results found for this basename: LIPASE, AMYLASE,  in the last 168 hours No results found for this basename: AMMONIA,  in the last 168 hours CBC:  Recent Labs Lab 10/13/13 2318 10/14/13 0445  WBC 6.9 5.6  NEUTROABS 4.6  --   HGB 11.4* 10.2*  HCT 32.9* 30.2*  MCV 75.5* 76.5*  PLT 206 183   Cardiac Enzymes: No results found for this basename: CKTOTAL, CKMB, CKMBINDEX, TROPONINI,  in the last 168 hours BNP (last 3 results)  Recent Labs  09/24/13 2250 10/13/13 2318  PROBNP 1029.0* 6255.0*   CBG:  Recent Labs Lab 10/14/13 0812 10/14/13 1229 10/14/13 1555 10/14/13 2119 10/15/13 0735  GLUCAP 109* 132* 164* 133* 88    Recent Results (from the past 240 hour(s))  CULTURE, BLOOD (ROUTINE X 2)     Status: None   Collection Time    10/14/13 12:50 AM      Result Value Ref Range Status   Specimen Description BLOOD RIGHT ARM   Final   Special Requests BOTTLES DRAWN AEROBIC AND ANAEROBIC [place]  Final   Culture  Setup Time     Final   Value: 10/14/2013 08:54     Performed at [facility]   Culture     Final   Value: GRAM POSITIVE COCCI IN CLUSTERS     Note: Gram Stain Report Called to,Read Back By and Verified With: ANTIONETTE AT 0145 10/15/13 BY SNOLO     Performed at [facility]   Report Status PENDING   Incomplete  MRSA PCR SCREENING     Status: None   Collection Time    10/14/13  6:31 AM      Result Value Ref Range Status   MRSA by PCR NEGATIVE  NEGATIVE Final   Comment:            The GeneXpert MRSA Assay (FDA     approved for NASAL specimens     only), is one component of a     comprehensive MRSA colonization     surveillance program. It is not     intended to diagnose MRSA     infection nor to guide or     monitor treatment for     MRSA infections.      Studies: Dg Chest Portable 1 View  10/14/2013   CLINICAL DATA:  Shortness of breath.  EXAM: PORTABLE CHEST - 1 VIEW  COMPARISON:  09/26/2013  FINDINGS: Shallow inspiration. Borderline heart size with mild vascular congestion. No definite edema or airspace disease. Colonic interposition under the right hemidiaphragm. Degenerative changes in the shoulders. No change since prior study.  IMPRESSION: Shallow inspiration.  Mild vascular congestion without change.   Electronically Signed   By: Lucienne Capers M.D.   On: 10/14/2013 00:09    Scheduled Meds: . antiseptic oral rinse  15 mL Mouth Rinse BID  . docusate sodium  100 mg Oral BID  . enoxaparin (LOVENOX) injection  30 mg Subcutaneous Daily  . feeding supplement (GLUCERNA SHAKE)  237 mL Oral TID BM  . ferrous sulfate  325 mg Oral BID WC  . insulin aspart  0-9 Units Subcutaneous TID WC  . Memantine HCl ER  28 mg Oral Daily  . pantoprazole  40 mg Oral Daily  . sertraline  50 mg Oral Daily  . simvastatin  20 mg Oral QPM   Continuous Infusions:    Charlynne Cousins  [facility] Pager [phone] If 8PM-8AM, please contact night-coverage at [email or web address], password [facility] 10/15/2013, 8:38 AM  LOS: 2 days     **Disclaimer: This note may have been dictated with voice recognition software. Similar sounding words can inadvertently be transcribed and this note may contain transcription errors which may not have been corrected upon publication of note.**

## 2013-10-15 NOTE — Progress Notes (Signed)
CRITICAL VALUE ALERT  Critical value received:  Blood Culture (+)  Date of notification:10/15/13  Time of notification:  0145  Critical value read back:yes  Nurse who received alert:  A. Shon Baton  MD notified (1st page): M. Lynch  Time of first page: 0155  MD notified (2nd page):  Time of second page:  Responding MD:   Time MD responded:

## 2013-10-16 DIAGNOSIS — I10 Essential (primary) hypertension: Secondary | ICD-10-CM

## 2013-10-16 LAB — GLUCOSE, CAPILLARY
GLUCOSE-CAPILLARY: 90 mg/dL (ref 70–99)
GLUCOSE-CAPILLARY: 144 mg/dL — AB (ref 70–99)

## 2013-10-16 LAB — CULTURE, BLOOD (ROUTINE X 2)

## 2013-10-16 LAB — BASIC METABOLIC PANEL
Anion gap: 22 — ABNORMAL HIGH (ref 5–15)
BUN: 49 mg/dL — ABNORMAL HIGH (ref 6–23)
CO2: 15 mEq/L — ABNORMAL LOW (ref 19–32)
Calcium: 9.5 mg/dL (ref 8.4–10.5)
Chloride: 97 mEq/L (ref 96–112)
Creatinine, Ser: 1.39 mg/dL — ABNORMAL HIGH (ref 0.50–1.10)
GFR calc Af Amer: 38 mL/min — ABNORMAL LOW (ref >90)
GFR calc non Af Amer: 33 mL/min — ABNORMAL LOW (ref >90)
GLUCOSE: 138 mg/dL — AB (ref 70–99)
Potassium: 4.4 mEq/L (ref 3.7–5.3)
SODIUM: 134 meq/L — AB (ref 137–147)

## 2013-10-16 MED ORDER — QUETIAPINE FUMARATE 25 MG PO TABS
25.0000 mg | ORAL_TABLET | Freq: Two times a day (BID) | ORAL | Status: DC
  Administered 2013-10-16: 25 mg via ORAL
  Filled 2013-10-16 (×2): qty 1

## 2013-10-16 MED ORDER — ACETAMINOPHEN 325 MG PO TABS
650.0000 mg | ORAL_TABLET | Freq: Four times a day (QID) | ORAL | Status: DC | PRN
Start: 2013-10-16 — End: 2013-10-16

## 2013-10-16 MED ORDER — HALOPERIDOL LACTATE 5 MG/ML IJ SOLN
0.5000 mg | Freq: Four times a day (QID) | INTRAMUSCULAR | Status: DC | PRN
  Administered 2013-10-16: 0.5 mg via INTRAVENOUS

## 2013-10-16 MED ORDER — HALOPERIDOL LACTATE 5 MG/ML IJ SOLN: 1.0000 mg | Freq: Four times a day (QID) | INTRAMUSCULAR | Status: DC | PRN

## 2013-10-16 MED ORDER — HALOPERIDOL LACTATE 5 MG/ML IJ SOLN
INTRAMUSCULAR | Status: AC
  Filled 2013-10-16: qty 1

## 2013-10-16 NOTE — Progress Notes (Signed)
[facility] PROGRESS NOTE  Filed Weights   10/14/13 0514 10/15/13 0709 10/16/13 0606  Weight: 75.5 kg (166 lb 7.2 oz) 74.38 kg (163 lb 15.7 oz) 74.571 kg (164 lb 6.4 oz)        Intake/Output Summary (Last 24 hours) at 10/16/13 1136 Last data filed at 10/15/13 1309  Gross per 24 hour  Intake     60 ml  Output      0 ml  Net     60 ml     Assessment/Plan: Hypovolemic shock/Hyponatremi/AKI: - Due to decrease oral intake and diuretics. - Encourage oral intake. Afebrile, no leukocytosis. - Family does not have reasonable expectation about her mom.she wants her to be a full code, she wants to do any thing in her power to keep her alive.  Dementia with behavioral disturbance - Continue outpatient Namenda, Zoloft. - added Seroquel.  Code Status: full Family Communication: none  Disposition Plan: inpatient   Consultants:  PCCM  Procedures: ECHO:   Antibiotics: Zosyn 7/10 >> 7/10 - Discontinued  Vancomycin 7/10 >> 7/10 - Discontinued   HPI/Subjective: Screaming and calling out names. screaming she is SOB.  Objective: Filed Vitals:   10/15/13 1309 10/15/13 2100 10/15/13 2118 10/16/13 0606  BP: 93/38 92/47 100/50 89/46  Pulse: 68 79 79 78  Temp: 97.5 F (36.4 C) 98 F (36.7 C)  97.9 F (36.6 C)  TempSrc: Oral Oral  Oral  Resp: 18 18    Height:      Weight:    74.571 kg (164 lb 6.4 oz)  SpO2: 98% 100%  100%     Exam:  General: in no acute distress.  HEENT: No bruits, no goiter. -JVD Heart: Regular rate and rhythm, without murmurs, rubs, gallops.  Lungs: Good air movement, clear Abdomen: Soft, nontender, nondistended, positive bowel sounds.     Data Reviewed: Basic Metabolic Panel:  Recent Labs Lab 10/13/13 2318 10/14/13 0445 10/15/13 0311  NA 123* 126* 130*  K 5.8* 5.3 4.8  CL 89* 94* 98  CO2 16* 16* 16*  GLUCOSE 143* 145* 86  BUN 74* 69* 56*  CREATININE 2.19* 2.09* 1.34*  CALCIUM 9.0 8.2* 8.5  MG  --  1.4*  --   PHOS  --  4.2   --    Liver Function Tests: No results found for this basename: AST, ALT, ALKPHOS, BILITOT, PROT, ALBUMIN,  in the last 168 hours No results found for this basename: LIPASE, AMYLASE,  in the last 168 hours No results found for this basename: AMMONIA,  in the last 168 hours CBC:  Recent Labs Lab 10/13/13 2318 10/14/13 0445  WBC 6.9 5.6  NEUTROABS 4.6  --   HGB 11.4* 10.2*  HCT 32.9* 30.2*  MCV 75.5* 76.5*  PLT 206 183   Cardiac Enzymes: No results found for this basename: CKTOTAL, CKMB, CKMBINDEX, TROPONINI,  in the last 168 hours BNP (last 3 results)  Recent Labs  09/24/13 2250 10/13/13 2318  PROBNP 1029.0* 6255.0*   CBG:  Recent Labs Lab 10/15/13 0735 10/15/13 1112 10/15/13 1725 10/15/13 2131 10/16/13 0613  GLUCAP 88 115* 104* 109* 90    Recent Results (from the past 240 hour(s))  CULTURE, BLOOD (ROUTINE X 2)     Status: None   Collection Time    10/14/13 12:50 AM      Result Value Ref Range Status   Specimen Description BLOOD RIGHT ARM   Final   Special Requests BOTTLES DRAWN AEROBIC AND ANAEROBIC 10CC  EACH   Final   Culture  Setup Time     Final   Value: 10/14/2013 08:54     Performed at [facility]   Culture     Final   Value: STAPHYLOCOCCUS SPECIES (COAGULASE NEGATIVE)     Note: THE SIGNIFICANCE OF ISOLATING THIS ORGANISM FROM A SINGLE SET OF BLOOD CULTURES WHEN MULTIPLE SETS ARE DRAWN IS UNCERTAIN. PLEASE NOTIFY THE MICROBIOLOGY DEPARTMENT WITHIN ONE WEEK IF SPECIATION AND SENSITIVITIES ARE REQUIRED.     Note: Gram Stain Report Called to,Read Back By and Verified With: ANTIONETTE AT 0145 10/15/13 BY SNOLO     Performed at [facility]   Report Status 10/16/2013 FINAL   Final  CULTURE, BLOOD (ROUTINE X 2)     Status: None   Collection Time    10/14/13 12:58 AM      Result Value Ref Range Status   Specimen Description BLOOD RIGHT HAND   Final   Special Requests BOTTLES DRAWN AEROBIC ONLY 10CC   Final   Culture  Setup Time     Final    Value: 10/14/2013 08:54     Performed at [facility]   Culture     Final   Value:        BLOOD CULTURE RECEIVED NO GROWTH TO DATE CULTURE WILL BE HELD FOR 5 DAYS BEFORE ISSUING A FINAL NEGATIVE REPORT     Performed at [facility]   Report Status PENDING   Incomplete  MRSA PCR SCREENING     Status: None   Collection Time    10/14/13  6:31 AM      Result Value Ref Range Status   MRSA by PCR NEGATIVE  NEGATIVE Final   Comment:            The GeneXpert MRSA Assay (FDA     approved for NASAL specimens     only), is one component of a     comprehensive MRSA colonization     surveillance program. It is not     intended to diagnose MRSA     infection nor to guide or     monitor treatment for     MRSA infections.     Studies: No results found.  Scheduled Meds: . antiseptic oral rinse  15 mL Mouth Rinse BID  . docusate sodium  100 mg Oral BID  . enoxaparin (LOVENOX) injection  30 mg Subcutaneous Daily  . feeding supplement (GLUCERNA SHAKE)  237 mL Oral TID BM  . ferrous sulfate  325 mg Oral BID WC  . insulin aspart  0-9 Units Subcutaneous TID WC  . Memantine HCl ER  28 mg Oral Daily  . pantoprazole  40 mg Oral Daily  . QUEtiapine  25 mg Oral BID  . sertraline  50 mg Oral Daily  . simvastatin  20 mg Oral QPM   Continuous Infusions:    Charlynne Cousins  [facility] Pager [phone] If 8PM-8AM, please contact night-coverage at [email or web address], password [facility] 10/16/2013, 11:36 AM  LOS: 3 days     **Disclaimer: This note may have been dictated with voice recognition software. Similar sounding words can inadvertently be transcribed and this note may contain transcription errors which may not have been corrected upon publication of note.**

## 2013-10-16 NOTE — Progress Notes (Signed)
Dr. Damaris Schooner with daughter and pt ok to discharge back to facility. D/d' Iv and tele. Report given to [place] at [place].

## 2013-10-17 ENCOUNTER — Non-Acute Institutional Stay (SKILLED_NURSING_FACILITY): Payer: PRIVATE HEALTH INSURANCE | Admitting: Internal Medicine

## 2013-10-17 ENCOUNTER — Encounter: Payer: Self-pay | Admitting: Internal Medicine

## 2013-10-17 DIAGNOSIS — F03918 Unspecified dementia, unspecified severity, with other behavioral disturbance: Secondary | ICD-10-CM

## 2013-10-17 DIAGNOSIS — N179 Acute kidney failure, unspecified: Secondary | ICD-10-CM

## 2013-10-17 DIAGNOSIS — R571 Hypovolemic shock: Secondary | ICD-10-CM

## 2013-10-17 DIAGNOSIS — D638 Anemia in other chronic diseases classified elsewhere: Secondary | ICD-10-CM

## 2013-10-17 DIAGNOSIS — I509 Heart failure, unspecified: Secondary | ICD-10-CM

## 2013-10-17 DIAGNOSIS — E43 Unspecified severe protein-calorie malnutrition: Secondary | ICD-10-CM

## 2013-10-17 DIAGNOSIS — E871 Hypo-osmolality and hyponatremia: Secondary | ICD-10-CM

## 2013-10-17 DIAGNOSIS — I9589 Other hypotension: Secondary | ICD-10-CM

## 2013-10-17 DIAGNOSIS — F0391 Unspecified dementia with behavioral disturbance: Secondary | ICD-10-CM

## 2013-10-17 DIAGNOSIS — R578 Other shock: Secondary | ICD-10-CM

## 2013-10-17 DIAGNOSIS — I5043 Acute on chronic combined systolic (congestive) and diastolic (congestive) heart failure: Secondary | ICD-10-CM

## 2013-10-17 NOTE — Assessment & Plan Note (Signed)
Due to decrease oral intake and diuretics.  - Encourage oral intake. Afebrile, no leukocytosis.  - Hyponatremia, hypotension and acute renal failure resolved with IV fluids. Patient is unable to maintain adequate fluid intake.  - Family with unreasonable expectation about her mom, she was continued IV fluids she wants her to be a full coated do anything in our power to keep her alive. Even if that means being bringing her into the hospital every time and doing every test needed.  - She will not go on an ACE inhibitor or diuretic. As she's not able to adequately hydrate herself. I have discussed with the family and they have on reasonable expectation about her mother.  - She will continue on the metoprolol and the aspirin

## 2013-10-17 NOTE — Assessment & Plan Note (Signed)
Patient does not have adequate caloric intake. She doesn't even want to drink her Ensure in hospital;today I asked her about a shake drink and she said OK so we will see if she drinks them;they need to be added to her fluid restriction

## 2013-10-17 NOTE — Assessment & Plan Note (Signed)
As above.

## 2013-10-17 NOTE — Assessment & Plan Note (Signed)
No ACE, no lasix 2/2 poor po intake

## 2013-10-17 NOTE — Progress Notes (Signed)
MRN: [id number] Name: [name]  Sex: female Age: 80 y.o. DOB: 06-30-1923  [place] #: [name] Facility/Room: 101B Level Of Care: SNF Provider: Inocencio Homes D Emergency Contacts: Extended Emergency Contact Information Primary Emergency Contact: [name], [place] Phone: [phone] Work Phone: [phone] Relation: Daughter Secondary Emergency Contact: [name] Phone: [phone] Relation: Grandaughter  Code Status: FULL  Allergies: Review of patient's allergies indicates no known allergies.  Chief Complaint  Patient presents with  . nursing home admission    HPI: Patient is 80 y.o. female who is admitted after becoming dehydrated on her usual meds 2/2 poor po intake  Past Medical History  Diagnosis Date  . PE (pulmonary embolism) 2011  . Ovarian cancer   . Hyperlipidemia   . Rheumatoid arthritis(714.0)   . GERD (gastroesophageal reflux disease)   . Hypertension   . Anxiety   . Diabetes mellitus   . Diabetes mellitus   . Hx of ovarian cancer 06/21/2011    Past Surgical History  Procedure Laterality Date  . Total hip arthroplasty        Medication List       This list is accurate as of: 10/17/13  9:35 PM.  Always use your most recent med list.               docusate sodium 100 MG capsule  Commonly known as:  COLACE  Take 100 mg by mouth 2 (two) times daily.     ferrous sulfate 325 (65 FE) MG EC tablet  Take 325 mg by mouth 2 (two) times daily.     metoprolol tartrate 12.5 mg Tabs tablet  Commonly known as:  LOPRESSOR  Take 0.5 tablets (12.5 mg total) by mouth 2 (two) times daily.     NAMENDA XR 28 MG Cp24  Generic drug:  Memantine HCl ER  Take 28 mg by mouth daily. For dementia     nitroGLYCERIN 0.4 MG SL tablet  Commonly known as:  NITROSTAT  Place 0.4 mg under the tongue every 5 (five) minutes as needed for chest pain.     nystatin powder   Commonly known as:  MYCOSTATIN  Apply topically 2 (two) times daily.     omeprazole 20 MG capsule  Commonly known as:  PRILOSEC  Take 20 mg by mouth every morning. For esophageal reflux     promethazine 25 MG tablet  Commonly known as:  PHENERGAN  Take 25 mg by mouth every 6 (six) hours as needed for nausea or vomiting.     sertraline 50 MG tablet  Commonly known as:  ZOLOFT  Take 50 mg by mouth daily.     simvastatin 20 MG tablet  Commonly known as:  ZOCOR  Take 20 mg by mouth every evening. For hyperlipidemia     traMADol 50 MG tablet  Commonly known as:  ULTRAM  Take 1 tablet (50 mg total) by mouth every 6 (six) hours as needed for moderate pain.        No orders of the defined types were placed in this encounter.    Immunization History  Administered Date(s) Administered  . Tdap 07/01/2011, 06/11/2012    History  Substance Use Topics  . Smoking status: Former Research scientist (life sciences)  . Smokeless tobacco: Former Systems developer  . Alcohol Use: No    Family history is noncontributory    Review of Systems  DATA OBTAINED: from patient;  pt with no c/o; admits she might drink a shake GENERAL: Feels well no fevers, fatigue, no appetite  SKIN: No itching, rash  EYES: No eye pain, redness, discharge EARS: No earache, tinnitus, change in hearing NOSE: No congestion, drainage or bleeding  MOUTH/THROAT: No mouth or tooth pain, No sore throat RESPIRATORY: No cough, wheezing, SOB CARDIAC: No chest pain, palpitations, lower extremity edema  GI: No abdominal pain, No N/V/D or constipation, No heartburn or reflux  GU: No dysuria, frequency or urgency, or incontinence  MUSCULOSKELETAL: No unrelieved bone/joint pain NEUROLOGIC: No headache, dizziness or focal weakness PSYCHIATRIC: No overt anxiety or sadness. Sleeps well. No behavior issue.   Filed Vitals:   10/17/13 2115  BP: 137/47  Pulse: 66  Temp: 99 F (37.2 C)  Resp: 16    Physical Exam  GENERAL APPEARANCE: dozing, modconversant.  Appropriately groomed. No acute distress.  SKIN: No diaphoresis rash HEAD: Normocephalic, atraumatic  EYES: Conjunctiva/lids clear. Pupils round, reactive. EOMs intact.  EARS: External exam WNL, canals clear. Hearing grossly normal.  NOSE: No deformity or discharge.  MOUTH/THROAT: Lips w/o lesions.   RESPIRATORY: Breathing is even, unlabored. Lung sounds are clear   CARDIOVASCULAR: Heart RRR no murmurs, rubs or gallops. trace peripheral edema.  GASTROINTESTINAL: Abdomen is soft, non-tender, not distended w/ normal bowel sounds GENITOURINARY: Bladder non tender, not distended  MUSCULOSKELETAL: No abnormal joints or musculature NEUROLOGIC:  Cranial nerves 2-12 grossly intact PSYCHIATRIC: Mood and affect appropriate to situation, no behavioral issues  Patient Active Problem List   Diagnosis Date Noted  . Hypovolemic shock 10/15/2013  . AKI (acute kidney injury) 10/15/2013  . Protein-calorie malnutrition, severe 10/15/2013  . Sepsis 10/14/2013  . Septic shock 10/14/2013  . Altered mental status 10/14/2013  . Pulmonary edema 10/14/2013  . Hypotension 10/14/2013  . Acute respiratory failure 10/08/2013  . Malnutrition of moderate degree 09/28/2013  . Hyponatremia 09/25/2013  . Acute systolic CHF (congestive heart failure) 09/25/2013  . Acute on chronic combined systolic and diastolic congestive heart failure 09/25/2013  . Anemia 09/09/2013  . Dementia with behavioral disturbance 07/22/2013  . Obesity, unspecified 05/16/2013  . GERD (gastroesophageal reflux disease)   . Hypertension   . Anxiety   . DM (diabetes mellitus), type 2 with neurological complications   . Constipation 06/23/2011  . Urinary tract infection 06/21/2011  . Hyperlipidemia 06/21/2011  . Rheumatoid arthritis(714.0) 06/21/2011  . Hx pulmonary embolism 06/21/2011  . Wheelchair bound 06/21/2011    CBC    Component Value Date/Time   WBC 5.6 10/14/2013 0445   RBC 3.95 10/14/2013 0445   HGB 10.2* 10/14/2013 0445    HCT 30.2* 10/14/2013 0445   PLT 183 10/14/2013 0445   MCV 76.5* 10/14/2013 0445   LYMPHSABS 1.8 10/13/2013 2318   MONOABS 0.4 10/13/2013 2318   EOSABS 0.1 10/13/2013 2318   BASOSABS 0.0 10/13/2013 2318    CMP     Component Value Date/Time   NA 134* 10/16/2013 1115   K 4.4 10/16/2013 1115   CL 97 10/16/2013 1115   CO2 15* 10/16/2013 1115   GLUCOSE 138* 10/16/2013 1115   BUN 49* 10/16/2013 1115   CREATININE 1.39* 10/16/2013 1115   CALCIUM 9.5 10/16/2013 1115   PROT 8.6* 09/24/2013 2206   ALBUMIN 2.9* 09/24/2013 2206   AST 14 09/24/2013 2206   ALT 6 09/24/2013 2206   ALKPHOS 81 09/24/2013 2206   BILITOT <0.2* 09/24/2013 2206   GFRNONAA 33* 10/16/2013 1115   GFRAA 38* 10/16/2013 1115    Assessment  and Plan  Hypotension Due to decrease oral intake and diuretics.  - Encourage oral intake. Afebrile, no leukocytosis.  - Hyponatremia, hypotension and acute renal failure resolved with IV fluids. Patient is unable to maintain adequate fluid intake.  - Family with unreasonable expectation about her mom, she was continued IV fluids she wants her to be a full coated do anything in our power to keep her alive. Even if that means being bringing her into the hospital every time and doing every test needed.  - She will not go on an ACE inhibitor or diuretic. As she's not able to adequately hydrate herself. I have discussed with the family and they have on reasonable expectation about her mother.  - She will continue on the metoprolol and the aspirin   Hypovolemic shock As above  Hyponatremia As above  Dementia with behavioral disturbance Continue outpatient Namenda, Zoloft   Protein-calorie malnutrition, severe Patient does not have adequate caloric intake. She doesn't even want to drink her Ensure in hospital;today I asked her about a shake drink and she said OK so we will see if she drinks them;they need to be added to her fluid restriction   Anemia Fairly stable esp within nutritional state  AKI  (acute kidney injury) No ACE, no lasix 2/2 poor po intake  Acute on chronic combined systolic and diastolic congestive heart failure Metoprolol alone;no ACE, no lasix due to poor po intake    [name], MD

## 2013-10-17 NOTE — Assessment & Plan Note (Signed)
Continue outpatient Namenda, Zoloft

## 2013-10-17 NOTE — Assessment & Plan Note (Signed)
Metoprolol alone;no ACE, no lasix due to poor po intake

## 2013-10-17 NOTE — Assessment & Plan Note (Signed)
Fairly stable esp within nutritional state

## 2013-10-20 LAB — CULTURE, BLOOD (ROUTINE X 2): CULTURE: NO GROWTH

## 2013-11-14 ENCOUNTER — Non-Acute Institutional Stay (SKILLED_NURSING_FACILITY): Payer: PRIVATE HEALTH INSURANCE | Admitting: Nurse Practitioner

## 2013-11-14 DIAGNOSIS — F0391 Unspecified dementia with behavioral disturbance: Secondary | ICD-10-CM

## 2013-11-14 DIAGNOSIS — I509 Heart failure, unspecified: Secondary | ICD-10-CM

## 2013-11-14 DIAGNOSIS — I10 Essential (primary) hypertension: Secondary | ICD-10-CM

## 2013-11-14 DIAGNOSIS — D638 Anemia in other chronic diseases classified elsewhere: Secondary | ICD-10-CM

## 2013-11-14 DIAGNOSIS — I5043 Acute on chronic combined systolic (congestive) and diastolic (congestive) heart failure: Secondary | ICD-10-CM

## 2013-11-14 DIAGNOSIS — F03918 Unspecified dementia, unspecified severity, with other behavioral disturbance: Secondary | ICD-10-CM

## 2013-11-14 DIAGNOSIS — E1149 Type 2 diabetes mellitus with other diabetic neurological complication: Secondary | ICD-10-CM

## 2013-11-14 NOTE — Progress Notes (Signed)
Patient ID: [name], female   DOB: 1924/03/24, 80 y.o.   MRN: [id number]    Nursing Home Location:  [place] of Service: SNF (31)  PCP: [name], MD  No Known Allergies  Chief Complaint  Patient presents with  . Medical Management of Chronic Issues    HPI:  80 year old female who is WC bound with PMH of memory loss, HTN, Hyperlipidemia, GERD, DM, constipation who is being seen today for routine follow up on chronic conditions. Pt recently hospitalized for FTT due to poor oral intake. Pt as done well since back at [facility], staff without complaints and pt without concerns.   Review of Systems:  Review of Systems  Constitutional: Negative for malaise/fatigue.  Respiratory: Negative for cough, hemoptysis, sputum production, shortness of breath and wheezing.   Cardiovascular: Negative for chest pain and palpitations.  Gastrointestinal: Negative for abdominal pain, diarrhea, constipation and blood in stool.  Genitourinary: Negative for dysuria.  Musculoskeletal: Negative for myalgias.  Skin: Negative.   Neurological: Negative for dizziness, weakness and headaches.  Psychiatric/Behavioral: Positive for memory loss. Negative for depression. The patient is nervous/anxious (stable).      Past Medical History  Diagnosis Date  . PE (pulmonary embolism) 2011  . Ovarian cancer   . Hyperlipidemia   . Rheumatoid arthritis(714.0)   . GERD (gastroesophageal reflux disease)   . Hypertension   . Anxiety   . Diabetes mellitus   . Diabetes mellitus   . Hx of ovarian cancer 06/21/2011   Past Surgical History  Procedure Laterality Date  . Total hip arthroplasty     Social History:   reports that she has quit smoking. She has quit using smokeless tobacco. She reports that she does not drink alcohol or use illicit drugs.  Family History  Problem Relation Age of Onset  . Diabetes type II      Medications: Patient's Medications  New Prescriptions     No medications on file  Previous Medications   DOCUSATE SODIUM (COLACE) 100 MG CAPSULE    Take 100 mg by mouth 2 (two) times daily.   FERROUS SULFATE 325 (65 FE) MG EC TABLET    Take 325 mg by mouth 2 (two) times daily.   MEMANTINE HCL ER (NAMENDA XR) 28 MG CP24    Take 28 mg by mouth daily. For dementia   NITROGLYCERIN (NITROSTAT) 0.4 MG SL TABLET    Place 0.4 mg under the tongue every 5 (five) minutes as needed for chest pain.   NYSTATIN (MYCOSTATIN) POWDER    Apply topically 2 (two) times daily.   OMEPRAZOLE (PRILOSEC) 20 MG CAPSULE    Take 20 mg by mouth every morning. For esophageal reflux   PROMETHAZINE (PHENERGAN) 25 MG TABLET    Take 25 mg by mouth every 6 (six) hours as needed for nausea or vomiting.   SERTRALINE (ZOLOFT) 50 MG TABLET    Take 50 mg by mouth daily.   SIMVASTATIN (ZOCOR) 20 MG TABLET    Take 20 mg by mouth every evening. For hyperlipidemia   TRAMADOL (ULTRAM) 50 MG TABLET    Take 1 tablet (50 mg total) by mouth every 6 (six) hours as needed for moderate pain.  Modified Medications   Modified Medication Previous Medication   METOPROLOL TARTRATE (LOPRESSOR) 12.5 MG TABS TABLET metoprolol tartrate (LOPRESSOR) 12.5 mg TABS tablet      Take 6.25 mg by mouth 2 (two) times daily.    Take 0.5  tablets (12.5 mg total) by mouth 2 (two) times daily.  Discontinued Medications   No medications on file     Physical Exam:  Filed Vitals:   11/14/13 1145  BP: 113/69  Pulse: 70  Temp: 97.9 F (36.6 C)  Resp: 20  Weight: 162 lb (73.483 kg)   Physical Exam  Constitutional: She is well-developed, well-nourished, and in no distress.  HENT:  Mouth/Throat: Oropharynx is clear and moist. No oropharyngeal exudate.  Poor dentition   Neck: Normal range of motion. Neck supple.  Cardiovascular: Normal rate, regular rhythm and normal heart sounds.   Pulmonary/Chest: Effort normal and breath sounds normal. No respiratory distress. She has no wheezes.  Abdominal: Soft. Bowel sounds  are normal.  Musculoskeletal: She exhibits no edema and no tenderness.  Neurological: She is alert.  Oriented to self only   Skin: Skin is warm and dry.  Eschar on right heel  Psychiatric: Affect normal.      Labs reviewed: Basic Metabolic Panel:  Recent Labs  10/14/13 0445 10/15/13 0311 10/16/13 1115  NA 126* 130* 134*  K 5.3 4.8 4.4  CL 94* 98 97  CO2 16* 16* 15*  GLUCOSE 145* 86 138*  BUN 69* 56* 49*  CREATININE 2.09* 1.34* 1.39*  CALCIUM 8.2* 8.5 9.5  MG 1.4*  --   --   PHOS 4.2  --   --    Liver Function Tests:  Recent Labs  09/24/13 2206  AST 14  ALT 6  ALKPHOS 81  BILITOT <0.2*  PROT 8.6*  ALBUMIN 2.9*   No results found for this basename: LIPASE, AMYLASE,  in the last 8760 hours No results found for this basename: AMMONIA,  in the last 8760 hours CBC:  Recent Labs  11/17/12 1440 09/24/13 2337  09/28/13 0342 10/13/13 2318 10/14/13 0445  WBC 4.2 5.5  < > 4.9 6.9 5.6  NEUTROABS 2.5 3.4  --   --  4.6  --   HGB 11.6* 11.7*  < > 11.4* 11.4* 10.2*  HCT 36.1 35.3*  < > 34.5* 32.9* 30.2*  MCV 83.0 77.1*  < > 77.5* 75.5* 76.5*  PLT 184 193  < > 207 206 183  < > = values in this interval not displayed. Cardiac Enzymes:  Recent Labs  09/24/13 2250 09/26/13 1619  TROPONINI <0.30 <0.30   BNP: No components found with this basename: POCBNP,  CBG:  Recent Labs  10/15/13 2131 10/16/13 0613 10/16/13 1131  GLUCAP 109* 90 144*   TSH:  Recent Labs  09/27/13 0745  TSH 0.699   Assessment/Plan 1. Essential hypertension -was previously low, lopressor decreased to 6.25mg  BID  2. DM (diabetes mellitus), type 2 with neurological complications -will follow up a1c  3. Dementia with behavioral disturbance -has been stable, without increases in behaviors   4. Acute on chronic combined systolic and diastolic congestive heart failure -with recent weight gain, was taken off diuretic and ACE in hospital due to hypotension and dehydration, no signs  of fluid overload. No shortness of breath or edema  5. Anemia in other chronic diseases classified elsewhere -will follow up cbc

## 2013-11-16 ENCOUNTER — Encounter: Payer: PRIVATE HEALTH INSURANCE | Admitting: Cardiology

## 2013-12-13 ENCOUNTER — Other Ambulatory Visit: Payer: Self-pay | Admitting: *Deleted

## 2013-12-13 MED ORDER — ALPRAZOLAM 0.5 MG PO TBDP: ORAL_TABLET | ORAL | Status: AC

## 2013-12-13 NOTE — Telephone Encounter (Signed)
[facility] 

## 2013-12-26 ENCOUNTER — Encounter: Payer: PRIVATE HEALTH INSURANCE | Admitting: Cardiology

## 2013-12-26 NOTE — Progress Notes (Signed)
HPI: 80 yo female for evaluation of CHF. Carotid dopplers 11/12 showed no stenosis. Echo 6/15 showed EF 30. BUN/Cr 7/15 49/1.39. Patient has significant dementia and resides in NH. Previous admissions for dehydration, hypotension and acute on chronic renal insuff.   Current Outpatient Prescriptions  Medication Sig Dispense Refill  . ALPRAZolam (NIRAVAM) 0.5 MG dissolvable tablet Take one tablet by mouth every four hours as needed for anxiety  180 tablet  5  . docusate sodium (COLACE) 100 MG capsule Take 100 mg by mouth 2 (two) times daily.      . ferrous sulfate 325 (65 FE) MG EC tablet Take 325 mg by mouth 2 (two) times daily.      . Memantine HCl ER (NAMENDA XR) 28 MG CP24 Take 28 mg by mouth daily. For dementia      . metoprolol tartrate (LOPRESSOR) 12.5 mg TABS tablet Take 6.25 mg by mouth 2 (two) times daily.      . nitroGLYCERIN (NITROSTAT) 0.4 MG SL tablet Place 0.4 mg under the tongue every 5 (five) minutes as needed for chest pain.      Marland Kitchen nystatin (MYCOSTATIN) powder Apply topically 2 (two) times daily.      Marland Kitchen omeprazole (PRILOSEC) 20 MG capsule Take 20 mg by mouth every morning. For esophageal reflux      . promethazine (PHENERGAN) 25 MG tablet Take 25 mg by mouth every 6 (six) hours as needed for nausea or vomiting.      . sertraline (ZOLOFT) 50 MG tablet Take 50 mg by mouth daily.      . simvastatin (ZOCOR) 20 MG tablet Take 20 mg by mouth every evening. For hyperlipidemia      . traMADol (ULTRAM) 50 MG tablet Take 1 tablet (50 mg total) by mouth every 6 (six) hours as needed for moderate pain.  120 tablet  0   No current facility-administered medications for this visit.    No Known Allergies  Past Medical History  Diagnosis Date  . PE (pulmonary embolism) 2011  . Ovarian cancer   . Hyperlipidemia   . Rheumatoid arthritis(714.0)   . GERD (gastroesophageal reflux disease)   . Hypertension   . Anxiety   . Diabetes mellitus   . Diabetes mellitus   . Hx of ovarian  cancer 06/21/2011    Past Surgical History  Procedure Laterality Date  . Total hip arthroplasty      History   Social History  . Marital Status: Widowed    Spouse Name: N/A    Number of Children: N/A  . Years of Education: N/A   Occupational History  . Not on file.   Social History Main Topics  . Smoking status: Former Research scientist (life sciences)  . Smokeless tobacco: Former Systems developer  . Alcohol Use: No  . Drug Use: No  . Sexual Activity: No   Other Topics Concern  . Not on file   Social History Narrative  . No narrative on file    Family History  Problem Relation Age of Onset  . Diabetes type II      ROS: no fevers or chills, productive cough, hemoptysis, dysphasia, odynophagia, melena, hematochezia, dysuria, hematuria, rash, seizure activity, orthopnea, PND, pedal edema, claudication. Remaining systems are negative.  Physical Exam:   There were no vitals taken for this visit.  General:  Well developed/well nourished in NAD Skin warm/dry Patient not depressed No peripheral clubbing Back-normal HEENT-normal/normal eyelids Neck supple/normal carotid upstroke bilaterally; no bruits; no JVD; no thyromegaly chest -  CTA/ normal expansion CV - RRR/normal S1 and S2; no murmurs, rubs or gallops;  PMI nondisplaced Abdomen -NT/ND, no HSM, no mass, + bowel sounds, no bruit 2+ femoral pulses, no bruits Ext-no edema, chords, 2+ DP Neuro-grossly nonfocal  ECG 10/16/13-sinus tachycardia, LBBB   This encounter was created in error - please disregard.

## 2013-12-30 ENCOUNTER — Non-Acute Institutional Stay (SKILLED_NURSING_FACILITY): Payer: PRIVATE HEALTH INSURANCE | Admitting: Nurse Practitioner

## 2013-12-30 DIAGNOSIS — B372 Candidiasis of skin and nail: Secondary | ICD-10-CM

## 2013-12-30 DIAGNOSIS — E1149 Type 2 diabetes mellitus with other diabetic neurological complication: Secondary | ICD-10-CM

## 2013-12-30 DIAGNOSIS — D638 Anemia in other chronic diseases classified elsewhere: Secondary | ICD-10-CM

## 2013-12-30 DIAGNOSIS — I10 Essential (primary) hypertension: Secondary | ICD-10-CM

## 2013-12-30 DIAGNOSIS — F03918 Unspecified dementia, unspecified severity, with other behavioral disturbance: Secondary | ICD-10-CM

## 2013-12-30 DIAGNOSIS — F0391 Unspecified dementia with behavioral disturbance: Secondary | ICD-10-CM

## 2013-12-30 NOTE — Progress Notes (Signed)
Patient ID: [name], female   DOB: 1923/07/06, 80 y.o.   MRN: [id number]    Nursing Home Location:  [facility] Service: SNF (31)  PCP: [name], MD  No Known Allergies  Chief Complaint  Patient presents with  . Medical Management of Chronic Issues    HPI:  80 year old female who is WC bound with PMH of memory loss, HTN, Hyperlipidemia, GERD, DM, constipation who is being seen today for routine follow up on chronic conditions. Pt being seen routinely by wound care due to sacral and heel wounds. Pt with recent yeast under breast and being treated with nystatin powder however experiencing increased amount of itching to the effected area. Nursing would like PRN for itch at this time  Review of Systems:  Review of Systems  Constitutional: Negative for malaise/fatigue.  Respiratory: Negative for cough, hemoptysis, sputum production, shortness of breath and wheezing.   Cardiovascular: Negative for chest pain and palpitations.  Gastrointestinal: Negative for abdominal pain, diarrhea, constipation and blood in stool.  Genitourinary: Negative for dysuria.  Musculoskeletal: Negative for myalgias.  Skin: itching to under breast   Neurological: Negative for dizziness, weakness and headaches.  Psychiatric/Behavioral: Positive for memory loss. Negative for depression. The patient is nervous/anxious (stable).      Past Medical History  Diagnosis Date  . PE (pulmonary embolism) 2011  . Ovarian cancer   . Hyperlipidemia   . Rheumatoid arthritis(714.0)   . GERD (gastroesophageal reflux disease)   . Hypertension   . Anxiety   . Diabetes mellitus   . Diabetes mellitus   . Hx of ovarian cancer 06/21/2011   Past Surgical History  Procedure Laterality Date  . Total hip arthroplasty     Social History:   reports that she has quit smoking. She has quit using smokeless tobacco. She reports that she does not drink alcohol or use illicit drugs.  Family  History  Problem Relation Age of Onset  . Diabetes type II      Medications: Patient's Medications  New Prescriptions   No medications on file  Previous Medications   ALPRAZOLAM (NIRAVAM) 0.5 MG DISSOLVABLE TABLET    Take one tablet by mouth every four hours as needed for anxiety   DOCUSATE SODIUM (COLACE) 100 MG CAPSULE    Take 100 mg by mouth 2 (two) times daily.   FERROUS SULFATE 325 (65 FE) MG EC TABLET    Take 325 mg by mouth 2 (two) times daily.   MEMANTINE HCL ER (NAMENDA XR) 28 MG CP24    Take 28 mg by mouth daily. For dementia   METOPROLOL TARTRATE (LOPRESSOR) 12.5 MG TABS TABLET    Take 6.25 mg by mouth 2 (two) times daily.   NITROGLYCERIN (NITROSTAT) 0.4 MG SL TABLET    Place 0.4 mg under the tongue every 5 (five) minutes as needed for chest pain.   NYSTATIN (MYCOSTATIN) POWDER    Apply topically 2 (two) times daily.   OMEPRAZOLE (PRILOSEC) 20 MG CAPSULE    Take 20 mg by mouth every morning. For esophageal reflux   PROMETHAZINE (PHENERGAN) 25 MG TABLET    Take 25 mg by mouth every 6 (six) hours as needed for nausea or vomiting.   SERTRALINE (ZOLOFT) 50 MG TABLET    Take 50 mg by mouth daily.   SIMVASTATIN (ZOCOR) 20 MG TABLET    Take 20 mg by mouth every evening. For hyperlipidemia   TRAMADOL (ULTRAM) 50 MG TABLET  Take 1 tablet (50 mg total) by mouth every 6 (six) hours as needed for moderate pain.  Modified Medications   No medications on file  Discontinued Medications   No medications on file     Physical Exam:  Filed Vitals:   12/30/13 1423  BP: 110/78  Pulse: 70  Temp: 98.4 F (36.9 C)  TempSrc: Oral  Resp: 18  Weight: 153 lb (69.4 kg)   Physical Exam  Constitutional: She is well-developed, well-nourished, and in no distress.  HENT:  Mouth/Throat: Oropharynx is clear and moist. No oropharyngeal exudate.  Poor dentition   Neck: Normal range of motion. Neck supple.  Cardiovascular: Normal rate, regular rhythm and normal heart sounds.   Pulmonary/Chest:  Effort normal and breath sounds normal. No respiratory distress. She has no wheezes.  Abdominal: Soft. Bowel sounds are normal.  Musculoskeletal: She exhibits no edema and no tenderness.  Neurological: She is alert.  Oriented to self only   Skin: Skin is warm and dry.  Eschar on right heel  Psychiatric: Affect normal.      Labs reviewed: Basic Metabolic Panel:  Recent Labs  10/14/13 0445 10/15/13 0311 10/16/13 1115  NA 126* 130* 134*  K 5.3 4.8 4.4  CL 94* 98 97  CO2 16* 16* 15*  GLUCOSE 145* 86 138*  BUN 69* 56* 49*  CREATININE 2.09* 1.34* 1.39*  CALCIUM 8.2* 8.5 9.5  MG 1.4*  --   --   PHOS 4.2  --   --    Liver Function Tests:  Recent Labs  09/24/13 2206  AST 14  ALT 6  ALKPHOS 81  BILITOT <0.2*  PROT 8.6*  ALBUMIN 2.9*   No results found for this basename: LIPASE, AMYLASE,  in the last 8760 hours No results found for this basename: AMMONIA,  in the last 8760 hours CBC:  Recent Labs  09/24/13 2337  09/28/13 0342 10/13/13 2318 10/14/13 0445  WBC 5.5  < > 4.9 6.9 5.6  NEUTROABS 3.4  --   --  4.6  --   HGB 11.7*  < > 11.4* 11.4* 10.2*  HCT 35.3*  < > 34.5* 32.9* 30.2*  MCV 77.1*  < > 77.5* 75.5* 76.5*  PLT 193  < > 207 206 183  < > = values in this interval not displayed. Cardiac Enzymes:  Recent Labs  09/24/13 2250 09/26/13 1619  TROPONINI <0.30 <0.30   BNP: No components found with this basename: POCBNP,  CBG:  Recent Labs  10/15/13 2131 10/16/13 0613 10/16/13 1131  GLUCAP 109* 90 144*   TSH:  Recent Labs  09/27/13 0745  TSH 0.699    CBC NO Diff (Complete Blood Count)    Result: 11/16/2013 3:19 PM   ( Status: F )       WBC 5.4     4.0-10.5 K/uL SLN   RBC 3.69   L 3.87-5.11 MIL/uL SLN   Hemoglobin 9.2   L 12.0-15.0 g/dL SLN   Hematocrit 28.9   L 36.0-46.0 % SLN   MCV 78.3     78.0-100.0 fL SLN   MCH 24.9   L 26.0-34.0 pg SLN   MCHC 31.8     30.0-36.0 g/dL SLN   RDW 17.2   H 11.5-15.5 % SLN   Platelet Count 200       150-400 K/uL SLN   Comprehensive Metabolic Panel    Result: 11/16/2013 3:08 PM   ( Status: F )       Sodium 139  135-145 mEq/L SLN   Potassium 4.1     3.5-5.3 mEq/L SLN   Chloride 113   H 96-112 mEq/L SLN   CO2 21     19-32 mEq/L SLN   Glucose 109   H 70-99 mg/dL SLN   BUN 30   H 6-23 mg/dL SLN   Creatinine 1.22   H 0.50-1.10 mg/dL SLN   Bilirubin, Total 0.2     0.2-1.2 mg/dL SLN   Alkaline Phosphatase 46     39-117 U/L SLN   AST/SGOT 13     0-37 U/L SLN   ALT/SGPT <8     0-35 U/L SLN   Total Protein 6.0     6.0-8.3 g/dL SLN   Albumin 2.4   L 3.5-5.2 g/dL SLN   Calcium 8.6     8.4-10.5 mg/dL SLN   Hemoglobin A1C    Result: 11/16/2013 8:20 PM   ( Status: F )       Hemoglobin A1C 6.5   H <5.7 % SLN C Estimated Average Glucose 140   H <117 mg/dL SLN   CBC with Diff    Result: 12/02/2013 2:54 PM   ( Status: F )       WBC 5.0     4.0-10.5 K/uL SLN   RBC 3.54   L 3.87-5.11 MIL/uL SLN   Hemoglobin 9.0   L 12.0-15.0 g/dL SLN   Hematocrit 27.2   L 36.0-46.0 % SLN   MCV 76.8   L 78.0-100.0 fL SLN   MCH 25.4   L 26.0-34.0 pg SLN   MCHC 33.1     30.0-36.0 g/dL SLN   RDW 17.4   H 11.5-15.5 % SLN   Platelet Count 184     150-400 K/uL SLN   Granulocyte % 60     43-77 % SLN   Absolute Gran 3.0     1.7-7.7 K/uL SLN   Lymph % 29     12-46 % SLN   Absolute Lymph 1.5     0.7-4.0 K/uL SLN   Mono % 8     3-12 % SLN   Absolute Mono 0.4     0.1-1.0 K/uL SLN   Eos % 3     0-5 % SLN   Absolute Eos 0.2     0.0-0.7 K/uL SLN   Baso % 0     0-1 % SLN   Absolute Baso 0.0     0.0-0.1 K/uL SLN   Smear Review Criteria for review not met  SLN   Iron    Result: 12/02/2013 3:29 PM   ( Status: F )       Iron 12   L 42-145 ug/dL SLN   TIBC    Result: 12/02/2013 3:29 PM   ( Status: F )       UIBC 129     125-400 ug/dL SLN   TIBC 141   L 250-470 ug/dL SLN   %SAT 9   L 20-55 % SLN   Ferritin    Result: 12/02/2013 3:32 PM   ( Status: F )       Ferritin 441   H 10-291 ng/mL SLN  Assessment/Plan 1. Essential  hypertension -stable on current medications  2. Dementia with behavioral disturbance Stable without significant decline in the last month  3. Yeast infection of the skin -skin without redness or irriatation on exam, seems to be improved however pt still with itch, will give PRN vistaril 62m q 8 hours  as needed for itch   4. DM (diabetes mellitus), type 2 with neurological complications T7S of 6.5, not currently on medications  5. Anemia in other chronic diseases classified elsewhere Iron remains low but hgb has been stable. conts on iron BID

## 2014-01-05 ENCOUNTER — Ambulatory Visit (INDEPENDENT_AMBULATORY_CARE_PROVIDER_SITE_OTHER): Payer: PRIVATE HEALTH INSURANCE | Admitting: Cardiology

## 2014-01-05 ENCOUNTER — Encounter: Payer: Self-pay | Admitting: Cardiology

## 2014-01-05 VITALS — BP 103/66 | HR 71

## 2014-01-05 DIAGNOSIS — I420 Dilated cardiomyopathy: Secondary | ICD-10-CM

## 2014-01-05 DIAGNOSIS — I5022 Chronic systolic (congestive) heart failure: Secondary | ICD-10-CM

## 2014-01-05 DIAGNOSIS — E785 Hyperlipidemia, unspecified: Secondary | ICD-10-CM

## 2014-01-05 MED ORDER — CARVEDILOL 3.125 MG PO TABS: 3.1250 mg | ORAL_TABLET | Freq: Two times a day (BID) | ORAL | Status: DC

## 2014-01-05 MED ORDER — CARVEDILOL 3.125 MG PO TABS: 3.1250 mg | ORAL_TABLET | Freq: Two times a day (BID) | ORAL | Status: AC

## 2014-01-05 MED ORDER — ASPIRIN EC 81 MG PO TBEC: 81.0000 mg | DELAYED_RELEASE_TABLET | Freq: Every day | ORAL | Status: AC

## 2014-01-05 NOTE — Assessment & Plan Note (Signed)
Patient has an ejection fraction of 30% by echocardiogram. Etiology unclear. There may be a contribution from coronary artery disease particularly given age. She is not a candidate for aggressive cardiac evaluation including no catheterization. She has significant dementia and is debilitated. She has decubitus ulcers. I will add aspirin 81 mg daily and continue statin. Change metoprolol to carvedilol 3.125 mg by mouth twice a day. Blood pressure was not allow an ACE inhibitor at this point. We could consider in the future if her blood pressure improves. I discussed CODE STATUS with her granddaughter. She remains a full code at their request.

## 2014-01-05 NOTE — Patient Instructions (Signed)
Your physician recommends that you schedule a follow-up appointment in:  3 MONTHS WITH DR CRENSHAW  START ASPIRIN 81 MG ONCE DAILY  STOP METOPROLOL  START CARVEDILOL 3.125MG  ONE TABLET TWICE DAILY

## 2014-01-05 NOTE — Progress Notes (Signed)
HPI: 80 yo female for evaluation of CHF. Patient resides in SNF and has dementia. Echo 09/26/13 showed EF 30 with multiple wall motion abnormalities. Admitted with hypotension 7/15 felt related to hypovolemia. Also with malnutrition and microcytic anemia. NCB status discussed but per notes, family requested all measures. Patient is not ambulatory. She has significant dementia. She is alert and oriented to person only. She responds no when asked about dyspnea, chest pain or syncope. She occasionally has mild pedal edema.  Current Outpatient Prescriptions  Medication Sig Dispense Refill  . ALPRAZolam (NIRAVAM) 0.5 MG dissolvable tablet Take one tablet by mouth every four hours as needed for anxiety  180 tablet  5  . docusate sodium (COLACE) 100 MG capsule Take 100 mg by mouth 2 (two) times daily.      . ferrous sulfate 325 (65 FE) MG EC tablet Take 325 mg by mouth 2 (two) times daily.      . hydrOXYzine (VISTARIL) 25 MG capsule Take 1 capsule by mouth every 8 (eight) hours as needed.      . Memantine HCl ER (NAMENDA XR) 28 MG CP24 Take 28 mg by mouth daily. For dementia      . metoprolol tartrate (LOPRESSOR) 12.5 mg TABS tablet Take 6.25 mg by mouth 2 (two) times daily.      . nitroGLYCERIN (NITROSTAT) 0.4 MG SL tablet Place 0.4 mg under the tongue every 5 (five) minutes as needed for chest pain.      Marland Kitchen nystatin (MYCOSTATIN) powder Apply topically 2 (two) times daily.      Marland Kitchen omeprazole (PRILOSEC) 20 MG capsule Take 20 mg by mouth every morning. For esophageal reflux      . promethazine (PHENERGAN) 25 MG tablet Take 25 mg by mouth every 6 (six) hours as needed for nausea or vomiting.      . sertraline (ZOLOFT) 50 MG tablet Take 50 mg by mouth daily.      . simvastatin (ZOCOR) 20 MG tablet Take 20 mg by mouth every evening. For hyperlipidemia      . traMADol (ULTRAM) 50 MG tablet Take 1 tablet (50 mg total) by mouth every 6 (six) hours as needed for moderate pain.  120 tablet  0   No current  facility-administered medications for this visit.    No Known Allergies  Past Medical History  Diagnosis Date  . PE (pulmonary embolism) 2011  . Ovarian cancer   . Hyperlipidemia   . Rheumatoid arthritis(714.0)   . GERD (gastroesophageal reflux disease)   . Hypertension   . Anxiety   . Diabetes mellitus   . Hx of ovarian cancer 06/21/2011  . CHF (congestive heart failure)   . Cardiomyopathy     Past Surgical History  Procedure Laterality Date  . Total hip arthroplasty      History   Social History  . Marital Status: Widowed    Spouse Name: N/A    Number of Children: N/A  . Years of Education: N/A   Occupational History  . Not on file.   Social History Main Topics  . Smoking status: Former Research scientist (life sciences)  . Smokeless tobacco: Former Systems developer  . Alcohol Use: No  . Drug Use: No  . Sexual Activity: No   Other Topics Concern  . Not on file   Social History Narrative  . No narrative on file    Family History  Problem Relation Age of Onset  . Diabetes type II      ROS: Patient  complains of pain from decubitus ulcer but no fevers or chills, productive cough, hemoptysis, dysphasia, odynophagia, melena, hematochezia, dysuria, hematuria, rash, seizure activity, orthopnea, PND. Remaining systems are negative.  Physical Exam:   Blood pressure 103/66, pulse 71.  General:  Well developed/frail in NAD Skin warm/dry No peripheral clubbing Back-not examined as pt presented on stretcher HEENT-normal/normal eyelids Neck supple/normal carotid upstroke bilaterally; no bruits; no JVD; no thyromegaly chest - CTA/ normal expansion CV - distant heart sounds, RRR/normal S1 and S2; no murmurs, rubs or gallops;  PMI nondisplaced Abdomen -NT/ND, no HSM, no mass, + bowel sounds, no bruit 1+ femoral pulses, no bruits Ext-no edema, no chords, Distal ulcers Neuro-Significant dementia  ECG 10/16/13-Sinus tachycardia with LBBB

## 2014-01-05 NOTE — Assessment & Plan Note (Signed)
Patient is euvolemic on examination. She is not presently on a diuretic. Would not add at this point as her volume status is stable. She also had renal insufficiency previously.

## 2014-01-05 NOTE — Assessment & Plan Note (Signed)
Continue statin. 

## 2014-01-16 ENCOUNTER — Non-Acute Institutional Stay (SKILLED_NURSING_FACILITY): Payer: PRIVATE HEALTH INSURANCE | Admitting: Internal Medicine

## 2014-01-16 ENCOUNTER — Encounter: Payer: Self-pay | Admitting: Internal Medicine

## 2014-01-16 DIAGNOSIS — S31000A Unspecified open wound of lower back and pelvis without penetration into retroperitoneum, initial encounter: Secondary | ICD-10-CM | POA: Insufficient documentation

## 2014-01-16 DIAGNOSIS — D509 Iron deficiency anemia, unspecified: Secondary | ICD-10-CM

## 2014-01-16 DIAGNOSIS — E785 Hyperlipidemia, unspecified: Secondary | ICD-10-CM

## 2014-01-16 DIAGNOSIS — F03918 Unspecified dementia, unspecified severity, with other behavioral disturbance: Secondary | ICD-10-CM

## 2014-01-16 DIAGNOSIS — E1140 Type 2 diabetes mellitus with diabetic neuropathy, unspecified: Secondary | ICD-10-CM

## 2014-01-16 DIAGNOSIS — L89620 Pressure ulcer of left heel, unstageable: Secondary | ICD-10-CM

## 2014-01-16 DIAGNOSIS — N183 Chronic kidney disease, stage 3 unspecified: Secondary | ICD-10-CM

## 2014-01-16 DIAGNOSIS — I10 Essential (primary) hypertension: Secondary | ICD-10-CM

## 2014-01-16 DIAGNOSIS — E43 Unspecified severe protein-calorie malnutrition: Secondary | ICD-10-CM

## 2014-01-16 DIAGNOSIS — I5022 Chronic systolic (congestive) heart failure: Secondary | ICD-10-CM

## 2014-01-16 DIAGNOSIS — M069 Rheumatoid arthritis, unspecified: Secondary | ICD-10-CM

## 2014-01-16 DIAGNOSIS — S31000D Unspecified open wound of lower back and pelvis without penetration into retroperitoneum, subsequent encounter: Secondary | ICD-10-CM

## 2014-01-16 DIAGNOSIS — E1149 Type 2 diabetes mellitus with other diabetic neurological complication: Secondary | ICD-10-CM

## 2014-01-16 DIAGNOSIS — L89600 Pressure ulcer of unspecified heel, unstageable: Secondary | ICD-10-CM | POA: Insufficient documentation

## 2014-01-16 DIAGNOSIS — F0391 Unspecified dementia with behavioral disturbance: Secondary | ICD-10-CM

## 2014-01-16 DIAGNOSIS — K219 Gastro-esophageal reflux disease without esophagitis: Secondary | ICD-10-CM

## 2014-01-16 NOTE — Assessment & Plan Note (Signed)
LDL 89, HDL37 in 11/2013, LFT's fine, pt on zocor 20 mg, don't really want to inc med

## 2014-01-16 NOTE — Assessment & Plan Note (Signed)
Pt is stable with EF 30 %; ASA 81 mg was added by cards 10/1 and lopressor changed to Coreg 3.125 BID; no room for ace at this time and Hx CRI

## 2014-01-16 NOTE — Assessment & Plan Note (Signed)
No change in plan, prilosec 20 mg daily

## 2014-01-16 NOTE — Assessment & Plan Note (Signed)
Continue supplements

## 2014-01-16 NOTE — Assessment & Plan Note (Signed)
Slowly progressive but pt seems content;continue namenda and zoloft; cards spoke to family about DNR recently but they want full code

## 2014-01-16 NOTE — Assessment & Plan Note (Signed)
GFR 38 in 10/2013; 30/1.22 in 11/2013; will monitor

## 2014-01-16 NOTE — Assessment & Plan Note (Signed)
Lopressor changed to coreg 3.125BID  by cards recently with good control;pt has been hypotensive in past,so needs monitoring

## 2014-01-16 NOTE — Assessment & Plan Note (Signed)
A1c 6.5 in 11/2013 on no meds; pt on statin

## 2014-01-16 NOTE — Assessment & Plan Note (Signed)
Iron 12 in 11/2013 with low sat reflective of nutritional status; Hb 9.0 in 11/2013; slt drifting downward, will monitor

## 2014-01-16 NOTE — Assessment & Plan Note (Signed)
Per wound nurse is from a rim of geri-chair; following, not really a decubitus

## 2014-01-16 NOTE — Assessment & Plan Note (Signed)
Pt on no specific meds and doesn';t have progression of dx;cont tramadol for pain

## 2014-01-16 NOTE — Assessment & Plan Note (Signed)
Wound care following

## 2014-01-16 NOTE — Progress Notes (Signed)
MRN: [id number] Name: [name]  Sex: female Age: 80 y.o. DOB: [birth year]  [place] #: [name] Facility/Room: 101 B Level Of Care: SNF Provider: Inocencio Homes D Emergency Contacts: Extended Emergency Contact Information Primary Emergency Contact: [name] Phone: [phone] Relation: Grandaughter Secondary Emergency Contact: [name], [name] Phone: [phone] Work Phone: [phone] Relation: Daughter  Code Status:FULL   Allergies: Review of patient's allergies indicates no known allergies.  Chief Complaint  Patient presents with  . Medical Management of Chronic Issues    HPI: Patient is 80 y.o. female who is being seen for routine issues.  Past Medical History  Diagnosis Date  . PE (pulmonary embolism) 2011  . Ovarian cancer   . Hyperlipidemia   . Rheumatoid arthritis(714.0)   . GERD (gastroesophageal reflux disease)   . Hypertension   . Anxiety   . Diabetes mellitus   . Hx of ovarian cancer 06/21/2011  . CHF (congestive heart failure)   . Cardiomyopathy     Past Surgical History  Procedure Laterality Date  . Total hip arthroplasty        Medication List       This list is accurate as of: 01/16/14  1:39 PM.  Always use your most recent med list.               ALPRAZolam 0.5 MG dissolvable tablet  Commonly known as:  NIRAVAM  Take one tablet by mouth every four hours as needed for anxiety     aspirin EC 81 MG tablet  Take 1 tablet (81 mg total) by mouth daily.     carvedilol 3.125 MG tablet  Commonly known as:  COREG  Take 1 tablet (3.125 mg total) by mouth 2 (two) times daily.     docusate sodium 100 MG capsule  Commonly known as:  COLACE  Take 100 mg by mouth 2 (two) times daily.     ferrous sulfate 325 (65 FE) MG EC tablet  Take 325 mg by mouth 2 (two) times daily.     hydrOXYzine 25 MG capsule  Commonly known as:  VISTARIL  Take 1 capsule  by mouth every 8 (eight) hours as needed.     NAMENDA XR 28 MG Cp24  Generic drug:  Memantine HCl ER  Take 28 mg by mouth daily. For dementia     nitroGLYCERIN 0.4 MG SL tablet  Commonly known as:  NITROSTAT  Place 0.4 mg under the tongue every 5 (five) minutes as needed for chest pain.     nystatin powder  Commonly known as:  MYCOSTATIN  Apply topically 2 (two) times daily.     omeprazole 20 MG capsule  Commonly known as:  PRILOSEC  Take 20 mg by mouth every morning. For esophageal reflux     promethazine 25 MG tablet  Commonly known as:  PHENERGAN  Take 25 mg by mouth every 6 (six) hours as needed for nausea or vomiting.     sertraline 50 MG tablet  Commonly known as:  ZOLOFT  Take 50 mg by mouth daily.     simvastatin 20 MG tablet  Commonly known as:  ZOCOR  Take 20 mg by mouth every evening. For hyperlipidemia     traMADol 50 MG tablet  Commonly known as:  ULTRAM  Take 1 tablet (50 mg total) by mouth every 6 (six) hours as needed for moderate pain.  No orders of the defined types were placed in this encounter.    Immunization History  Administered Date(s) Administered  . Tdap 07/01/2011, 06/11/2012    History  Substance Use Topics  . Smoking status: Former Research scientist (life sciences)  . Smokeless tobacco: Former Systems developer  . Alcohol Use: No    Review of Systems  DATA OBTAINED: from patient, wound nurse GENERAL:  no fevers, fatigue, appetite changes SKIN: No itching, rash HEENT: No complaint RESPIRATORY: No cough, wheezing, SOB CARDIAC: No chest pain, palpitations, lower extremity edema  GI: No abdominal pain, No N/V/D or constipation, No heartburn or reflux  GU: No dysuria, frequency or urgency, or incontinence ;pain in rectum where wound is MUSCULOSKELETAL: No unrelieved bone/joint pain NEUROLOGIC: No headache, dizziness  PSYCHIATRIC: No overt anxiety or sadness  Filed Vitals:   01/16/14 1254  BP: 136/74  Pulse: 70  Temp: 98.3 F (36.8 C)  Resp: 18     Physical Exam  GENERAL APPEARANCE: Alert, conversant, No acute distress  SKIN: No diaphoresis rash; dressed - per Joann heels unstageable and sacrum from gerichair is not a decubitus HEENT: Unremarkable RESPIRATORY: Breathing is even, unlabored. Lung sounds are clear   CARDIOVASCULAR: Heart RRR no murmurs, rubs or gallops. No peripheral edema  GASTROINTESTINAL: Abdomen is soft, non-tender, not distended w/ normal bowel sounds.  GENITOURINARY: Bladder non tender, not distended  MUSCULOSKELETAL: No abnormal joints or musculature;booties on feet NEUROLOGIC: Cranial nerves 2-12 grossly intact. Moves upper extremities PSYCHIATRIC: Mood and affect appropriate to situation, no behavioral issues  Patient Active Problem List   Diagnosis Date Noted  . CKD (chronic kidney disease) stage 3, GFR 30-59 ml/min 01/16/2014  . Decubitus ulcer of heel, unstageable 01/16/2014  . Wound of sacral region 01/16/2014  . Congestive dilated cardiomyopathy 01/05/2014  . Chronic systolic congestive heart failure 01/05/2014  . Hypovolemic shock 10/15/2013  . AKI (acute kidney injury) 10/15/2013  . Protein-calorie malnutrition, severe 10/15/2013  . Sepsis 10/14/2013  . Septic shock 10/14/2013  . Altered mental status 10/14/2013  . Pulmonary edema 10/14/2013  . Hypotension 10/14/2013  . Acute respiratory failure 10/08/2013  . Malnutrition of moderate degree 09/28/2013  . Hyponatremia 09/25/2013  . Acute systolic CHF (congestive heart failure) 09/25/2013  . Acute on chronic combined systolic and diastolic congestive heart failure 09/25/2013  . Anemia, iron deficiency 09/09/2013  . Dementia with behavioral disturbance 07/22/2013  . Obesity, unspecified 05/16/2013  . GERD (gastroesophageal reflux disease)   . Hypertension   . Anxiety   . DM (diabetes mellitus), type 2 with neurological complications   . Constipation 06/23/2011  . Urinary tract infection 06/21/2011  . Hyperlipidemia LDL goal <70  06/21/2011  . Rheumatoid arthritis 06/21/2011  . Hx pulmonary embolism 06/21/2011  . Wheelchair bound 06/21/2011    CBC    Component Value Date/Time   WBC 5.6 10/14/2013 0445   RBC 3.95 10/14/2013 0445   HGB 10.2* 10/14/2013 0445   HCT 30.2* 10/14/2013 0445   PLT 183 10/14/2013 0445   MCV 76.5* 10/14/2013 0445   LYMPHSABS 1.8 10/13/2013 2318   MONOABS 0.4 10/13/2013 2318   EOSABS 0.1 10/13/2013 2318   BASOSABS 0.0 10/13/2013 2318    CMP     Component Value Date/Time   NA 134* 10/16/2013 1115   K 4.4 10/16/2013 1115   CL 97 10/16/2013 1115   CO2 15* 10/16/2013 1115   GLUCOSE 138* 10/16/2013 1115   BUN 49* 10/16/2013 1115   CREATININE 1.39* 10/16/2013 1115   CALCIUM 9.5 10/16/2013 1115  PROT 8.6* 09/24/2013 2206   ALBUMIN 2.9* 09/24/2013 2206   AST 14 09/24/2013 2206   ALT 6 09/24/2013 2206   ALKPHOS 81 09/24/2013 2206   BILITOT <0.2* 09/24/2013 2206   GFRNONAA 33* 10/16/2013 1115   GFRAA 38* 10/16/2013 1115    Assessment and Plan  Chronic systolic congestive heart failure Pt is stable with EF 30 %; ASA 81 mg was added by cards 10/1 and lopressor changed to Coreg 3.125 BID; no room for ace at this time and Hx CRI  Hypertension Lopressor changed to coreg 3.125BID  by cards recently with good control;pt has been hypotensive in past,so needs monitoring  GERD (gastroesophageal reflux disease) No change in plan, prilosec 20 mg daily  DM (diabetes mellitus), type 2 with neurological complications A4S 6.5 in 11/2013 on no meds; pt on statin  CKD (chronic kidney disease) stage 3, GFR 30-59 ml/min GFR 38 in 10/2013; 30/1.22 in 11/2013; will monitor  Anemia, iron deficiency Iron 12 in 11/2013 with low sat reflective of nutritional status; Hb 9.0 in 11/2013; slt drifting downward, will monitor  Hyperlipidemia LDL goal <70 LDL 89, HDL37 in 11/2013, LFT's fine, pt on zocor 20 mg, don't really want to inc med  Dementia with behavioral disturbance Slowly progressive but pt seems content;continue  namenda and zoloft; cards spoke to family about DNR recently but they want full code  Protein-calorie malnutrition, severe Continue supplements  Rheumatoid arthritis Pt on no specific meds and doesn';t have progression of dx;cont tramadol for pain  Decubitus ulcer of heel, unstageable Wound care following  Wound of sacral region Per wound nurse is from a rim of geri-chair; following, not really a decubitus    [name], MD

## 2014-01-27 ENCOUNTER — Non-Acute Institutional Stay (SKILLED_NURSING_FACILITY): Payer: PRIVATE HEALTH INSURANCE | Admitting: Nurse Practitioner

## 2014-01-27 DIAGNOSIS — L89600 Pressure ulcer of unspecified heel, unstageable: Secondary | ICD-10-CM

## 2014-01-27 DIAGNOSIS — E43 Unspecified severe protein-calorie malnutrition: Secondary | ICD-10-CM

## 2014-01-27 NOTE — Progress Notes (Signed)
Patient ID: [name], female   DOB: 1924/01/08, 80 y.o.   MRN: [id number]    Nursing Home Location:  [place] [facility] Service: SN[facility] (31)  PCP: [name], MD  No Known Allergies  Chief Complaint  Patient presents with  . Acute Visit    HPI:  80 year old female who is WC bound with PMH of memory loss, HTN, Hyperlipidemia, GERD, DM, constipation who is being seen today per nursing request. Pt with slow-healing wounds. Staff worried about nutritional status and fluid. Pt with 2000 cc fluid restriction due to CHF and Congestive dilated cardiomyopathy, Echo 09/26/13 showed EF 30% with multiple wall motion abnormalities. Pt reports she eats well. Staff reports she eats 50-75%. Has assistance sometimes with meals. Does not report pain when eating (has poor dentition) or trouble swallowing. Reports she drinks some. Currently on prostat bid and MVI to heal with wound healing.   Review of Systems:  Review of Systems  Constitutional: Negative for malaise/fatigue.  Respiratory: Negative for cough, hemoptysis, sputum production, shortness of breath and wheezing.   Cardiovascular: Negative for chest pain and palpitations.  Gastrointestinal: Negative for abdominal pain, diarrhea, constipation and blood in stool.  Genitourinary: Negative for dysuria.  Musculoskeletal: Negative for myalgias.  Neurological: Negative for dizziness, weakness and headaches.  Psychiatric/Behavioral: Positive for memory loss. Negative for depression.    Past Medical History  Diagnosis Date  . PE (pulmonary embolism) 2011  . Ovarian cancer   . Hyperlipidemia   . Rheumatoid arthritis(714.0)   . GERD (gastroesophageal reflux disease)   . Hypertension   . Anxiety   . Diabetes mellitus   . Hx of ovarian cancer 06/21/2011  . CHF (congestive heart failure)   . Cardiomyopathy    Past Surgical History  Procedure Laterality Date  . Total hip arthroplasty     Social History:   reports that  she has quit smoking. She has quit using smokeless tobacco. She reports that she does not drink alcohol or use illicit drugs.  Family History  Problem Relation Age of Onset  . Diabetes type II      Medications: Patient's Medications  New Prescriptions   No medications on file  Previous Medications   ALPRAZOLAM (NIRAVAM) 0.5 MG DISSOLVABLE TABLET    Take one tablet by mouth every four hours as needed for anxiety   ASPIRIN EC 81 MG TABLET    Take 1 tablet (81 mg total) by mouth daily.   CARVEDILOL (COREG) 3.125 MG TABLET    Take 1 tablet (3.125 mg total) by mouth 2 (two) times daily.   DOCUSATE SODIUM (COLACE) 100 MG CAPSULE    Take 100 mg by mouth 2 (two) times daily.   FERROUS SULFATE 325 (65 FE) MG EC TABLET    Take 325 mg by mouth 2 (two) times daily.   HYDROXYZINE (VISTARIL) 25 MG CAPSULE    Take 1 capsule by mouth every 8 (eight) hours as needed.   MEMANTINE HCL ER (NAMENDA XR) 28 MG CP24    Take 28 mg by mouth daily. For dementia   NITROGLYCERIN (NITROSTAT) 0.4 MG SL TABLET    Place 0.4 mg under the tongue every 5 (five) minutes as needed for chest pain.   NYSTATIN (MYCOSTATIN) POWDER    Apply topically 2 (two) times daily.   OMEPRAZOLE (PRILOSEC) 20 MG CAPSULE    Take 20 mg by mouth every morning. For esophageal reflux   PROMETHAZINE (PHENERGAN) 25 MG TABLET  Take 25 mg by mouth every 6 (six) hours as needed for nausea or vomiting.   SERTRALINE (ZOLOFT) 50 MG TABLET    Take 50 mg by mouth daily.   SIMVASTATIN (ZOCOR) 20 MG TABLET    Take 20 mg by mouth every evening. For hyperlipidemia   TRAMADOL (ULTRAM) 50 MG TABLET    Take 1 tablet (50 mg total) by mouth every 6 (six) hours as needed for moderate pain.  Modified Medications   No medications on file  Discontinued Medications   No medications on file     Physical Exam:  Filed Vitals:   01/27/14 1602  BP: 136/74  Pulse: 70  Temp: 98.3 F (36.8 C)  Resp: 20  Weight: 152 lb 9.6 oz (69.219 kg)   Physical Exam    Constitutional: She is well-developed, well-nourished, and in no distress.  HENT:  Mouth/Throat: Oropharynx is clear and moist. No oropharyngeal exudate.  Poor dentition   Neck: Normal range of motion. Neck supple.  Cardiovascular: Normal rate, regular rhythm and normal heart sounds.   Pulmonary/Chest: Effort normal and breath sounds normal. No respiratory distress. She has no wheezes.  Abdominal: Soft. Bowel sounds are normal.  Musculoskeletal: She exhibits no edema and no tenderness.  Neurological: She is alert.  Oriented to self only   Skin: Skin is warm and dry.  Bilateral heels with dsg, cdi Psychiatric: Affect normal.      Labs reviewed: Basic Metabolic Panel:  Recent Labs  10/14/13 0445 10/15/13 0311 10/16/13 1115  NA 126* 130* 134*  K 5.3 4.8 4.4  CL 94* 98 97  CO2 16* 16* 15*  GLUCOSE 145* 86 138*  BUN 69* 56* 49*  CREATININE 2.09* 1.34* 1.39*  CALCIUM 8.2* 8.5 9.5  MG 1.4*  --   --   PHOS 4.2  --   --    Liver Function Tests:  Recent Labs  09/24/13 2206  AST 14  ALT 6  ALKPHOS 81  BILITOT <0.2*  PROT 8.6*  ALBUMIN 2.9*   No results found for this basename: LIPASE, AMYLASE,  in the last 8760 hours No results found for this basename: AMMONIA,  in the last 8760 hours CBC:  Recent Labs  09/24/13 2337  09/28/13 0342 10/13/13 2318 10/14/13 0445  WBC 5.5  < > 4.9 6.9 5.6  NEUTROABS 3.4  --   --  4.6  --   HGB 11.7*  < > 11.4* 11.4* 10.2*  HCT 35.3*  < > 34.5* 32.9* 30.2*  MCV 77.1*  < > 77.5* 75.5* 76.5*  PLT 193  < > 207 206 183  < > = values in this interval not displayed. Cardiac Enzymes:  Recent Labs  09/24/13 2250 09/26/13 1619  TROPONINI <0.30 <0.30   BNP: No components found with this basename: POCBNP,  CBG:  Recent Labs  10/15/13 2131 10/16/13 0613 10/16/13 1131  GLUCAP 109* 90 144*   TSH:  Recent Labs  09/27/13 0745  TSH 0.699    CBC NO Diff (Complete Blood Count)    Result: 11/16/2013 3:19 PM   ( Status: F )        WBC 5.4     4.0-10.5 K/uL SLN   RBC 3.69   L 3.87-5.11 MIL/uL SLN   Hemoglobin 9.2   L 12.0-15.0 g/dL SLN   Hematocrit 28.9   L 36.0-46.0 % SLN   MCV 78.3     78.0-100.0 fL SLN   MCH 24.9   L 26.0-34.0 pg SLN  MCHC 31.8     30.0-36.0 g/dL SLN   RDW 17.2   H 11.5-15.5 % SLN   Platelet Count 200     150-400 K/uL SLN   Comprehensive Metabolic Panel    Result: 11/16/2013 3:08 PM   ( Status: F )       Sodium 139     135-145 mEq/L SLN   Potassium 4.1     3.5-5.3 mEq/L SLN   Chloride 113   H 96-112 mEq/L SLN   CO2 21     19-32 mEq/L SLN   Glucose 109   H 70-99 mg/dL SLN   BUN 30   H 6-23 mg/dL SLN   Creatinine 1.22   H 0.50-1.10 mg/dL SLN   Bilirubin, Total 0.2     0.2-1.2 mg/dL SLN   Alkaline Phosphatase 46     39-117 U/L SLN   AST/SGOT 13     0-37 U/L SLN   ALT/SGPT <8     0-35 U/L SLN   Total Protein 6.0     6.0-8.3 g/dL SLN   Albumin 2.4   L 3.5-5.2 g/dL SLN   Calcium 8.6     8.4-10.5 mg/dL SLN   Hemoglobin A1C    Result: 11/16/2013 8:20 PM   ( Status: F )       Hemoglobin A1C 6.5   H <5.7 % SLN C Estimated Average Glucose 140   H <117 mg/dL SLN   CBC with Diff    Result: 12/02/2013 2:54 PM   ( Status: F )       WBC 5.0     4.0-10.5 K/uL SLN   RBC 3.54   L 3.87-5.11 MIL/uL SLN   Hemoglobin 9.0   L 12.0-15.0 g/dL SLN   Hematocrit 27.2   L 36.0-46.0 % SLN   MCV 76.8   L 78.0-100.0 fL SLN   MCH 25.4   L 26.0-34.0 pg SLN   MCHC 33.1     30.0-36.0 g/dL SLN   RDW 17.4   H 11.5-15.5 % SLN   Platelet Count 184     150-400 K/uL SLN   Granulocyte % 60     43-77 % SLN   Absolute Gran 3.0     1.7-7.7 K/uL SLN   Lymph % 29     12-46 % SLN   Absolute Lymph 1.5     0.7-4.0 K/uL SLN   Mono % 8     3-12 % SLN   Absolute Mono 0.4     0.1-1.0 K/uL SLN   Eos % 3     0-5 % SLN   Absolute Eos 0.2     0.0-0.7 K/uL SLN   Baso % 0     0-1 % SLN   Absolute Baso 0.0     0.0-0.1 K/uL SLN   Smear Review Criteria for review not met  SLN   Iron    Result: 12/02/2013 3:29 PM   ( Status: F )        Iron 12   L 42-145 ug/dL SLN   TIBC    Result: 12/02/2013 3:29 PM   ( Status: F )       UIBC 129     125-400 ug/dL SLN   TIBC 141   L 250-470 ug/dL SLN   %SAT 9   L 20-55 % SLN   Ferritin    Result: 12/02/2013 3:32 PM   ( Status: F )       Ferritin  441   H 10-291 ng/mL SLN  Assessment/Plan  1. Protein-calorie malnutrition, severe Multifactorial, pt with dementia and poor dentition. RD following pt with supplements. Encouraged proper hydration up to fluid restriction  -weight has remained stable in the last 2 months. Will follow up cmp    2. Decubitus ulcer of heel, unstageable, unspecified laterality -slow healing wounds.  -will follow up CMP to assess albumin, wound MD, treatment nurse and RD following pt.  3. CHF -conts on fluid restriction due to congestion heart failure, EF of 30 %

## 2014-02-17 ENCOUNTER — Non-Acute Institutional Stay (SKILLED_NURSING_FACILITY): Payer: PRIVATE HEALTH INSURANCE | Admitting: Nurse Practitioner

## 2014-02-17 DIAGNOSIS — R9389 Abnormal findings on diagnostic imaging of other specified body structures: Secondary | ICD-10-CM

## 2014-02-17 DIAGNOSIS — S31000D Unspecified open wound of lower back and pelvis without penetration into retroperitoneum, subsequent encounter: Secondary | ICD-10-CM

## 2014-02-17 DIAGNOSIS — F419 Anxiety disorder, unspecified: Secondary | ICD-10-CM

## 2014-02-17 DIAGNOSIS — R938 Abnormal findings on diagnostic imaging of other specified body structures: Secondary | ICD-10-CM

## 2014-02-17 DIAGNOSIS — E43 Unspecified severe protein-calorie malnutrition: Secondary | ICD-10-CM

## 2014-02-17 DIAGNOSIS — I5022 Chronic systolic (congestive) heart failure: Secondary | ICD-10-CM

## 2014-02-17 DIAGNOSIS — K219 Gastro-esophageal reflux disease without esophagitis: Secondary | ICD-10-CM

## 2014-02-17 NOTE — Progress Notes (Signed)
Patient ID: [name], female   DOB: [birth year], 80 y.o.   MRN: [id number]    Nursing Home Location:  [place] of Service: SNF (31)  PCP: [name], MD  No Known Allergies  Chief Complaint  Patient presents with  . Acute Visit    xray review     HPI:  Patient is a 80 y.o. female seen today at [facility] for acute visit due to new xray reports. Daughter called and given report. Staff has no acute concerns at this time.pt as having cough and congestion but this seems to have improved at this time  Review of Systems:  Review of Systems  Unable to perform ROS: Dementia    Past Medical History  Diagnosis Date  . PE (pulmonary embolism) 2011  . Ovarian cancer   . Hyperlipidemia   . Rheumatoid arthritis(714.0)   . GERD (gastroesophageal reflux disease)   . Hypertension   . Anxiety   . Diabetes mellitus   . Hx of ovarian cancer 06/21/2011  . CHF (congestive heart failure)   . Cardiomyopathy    Past Surgical History  Procedure Laterality Date  . Total hip arthroplasty     Social History:   reports that she has quit smoking. She has quit using smokeless tobacco. She reports that she does not drink alcohol or use illicit drugs.  Family History  Problem Relation Age of Onset  . Diabetes type II      Medications: Patient's Medications  New Prescriptions   No medications on file  Previous Medications   ALPRAZOLAM (NIRAVAM) 0.5 MG DISSOLVABLE TABLET    Take one tablet by mouth every four hours as needed for anxiety   ASPIRIN EC 81 MG TABLET    Take 1 tablet (81 mg total) by mouth daily.   CARVEDILOL (COREG) 3.125 MG TABLET    Take 1 tablet (3.125 mg total) by mouth 2 (two) times daily.   DOCUSATE SODIUM (COLACE) 100 MG CAPSULE    Take 100 mg by mouth 2 (two) times daily.   FERROUS SULFATE 325 (65 FE) MG EC TABLET    Take 325 mg by mouth 2 (two) times daily.   HYDROXYZINE (VISTARIL) 25 MG CAPSULE    Take 1 capsule by mouth  every 8 (eight) hours as needed.   MEMANTINE HCL ER (NAMENDA XR) 28 MG CP24    Take 28 mg by mouth daily. For dementia   NITROGLYCERIN (NITROSTAT) 0.4 MG SL TABLET    Place 0.4 mg under the tongue every 5 (five) minutes as needed for chest pain.   NYSTATIN (MYCOSTATIN) POWDER    Apply topically 2 (two) times daily.   OMEPRAZOLE (PRILOSEC) 20 MG CAPSULE    Take 20 mg by mouth every morning. For esophageal reflux   PROMETHAZINE (PHENERGAN) 25 MG TABLET    Take 25 mg by mouth every 6 (six) hours as needed for nausea or vomiting.   SERTRALINE (ZOLOFT) 50 MG TABLET    Take 50 mg by mouth daily.   SIMVASTATIN (ZOCOR) 20 MG TABLET    Take 20 mg by mouth every evening. For hyperlipidemia   TRAMADOL (ULTRAM) 50 MG TABLET    Take 1 tablet (50 mg total) by mouth every 6 (six) hours as needed for moderate pain.  Modified Medications   No medications on file  Discontinued Medications   No medications on file     Physical Exam: Filed Vitals:   02/17/14 1655  BP: 119/89  Pulse: 80  Temp: 96.6 F (35.9 C)  Resp: 20  Weight: 152 lb (68.947 kg)    Physical Exam  Constitutional: She is oriented to person, place, and time. No distress.  Thin frail female in NAD  HENT:  Head: Normocephalic and atraumatic.  Mouth/Throat: No oropharyngeal exudate.  Poor dentition   Eyes: Conjunctivae are normal. Pupils are equal, round, and reactive to light.  Neck: Normal range of motion. Neck supple.  Cardiovascular: Normal rate, regular rhythm and normal heart sounds.   Pulmonary/Chest: Effort normal. She has decreased breath sounds.  Abdominal: Soft. Bowel sounds are normal.  Musculoskeletal: She exhibits no edema.  Neurological: She is alert and oriented to person, place, and time.  Skin: Skin is warm and dry. She is not diaphoretic.    Labs reviewed: Basic Metabolic Panel:  Recent Labs  10/14/13 0445 10/15/13 0311 10/16/13 1115  NA 126* 130* 134*  K 5.3 4.8 4.4  CL 94* 98 97  CO2 16* 16* 15*    GLUCOSE 145* 86 138*  BUN 69* 56* 49*  CREATININE 2.09* 1.34* 1.39*  CALCIUM 8.2* 8.5 9.5  MG 1.4*  --   --   PHOS 4.2  --   --    Liver Function Tests:  Recent Labs  09/24/13 2206  AST 14  ALT 6  ALKPHOS 81  BILITOT <0.2*  PROT 8.6*  ALBUMIN 2.9*   No results for input(s): LIPASE, AMYLASE in the last 8760 hours. No results for input(s): AMMONIA in the last 8760 hours. CBC:  Recent Labs  09/24/13 2337  09/28/13 0342 10/13/13 2318 10/14/13 0445  WBC 5.5  < > 4.9 6.9 5.6  NEUTROABS 3.4  --   --  4.6  --   HGB 11.7*  < > 11.4* 11.4* 10.2*  HCT 35.3*  < > 34.5* 32.9* 30.2*  MCV 77.1*  < > 77.5* 75.5* 76.5*  PLT 193  < > 207 206 183  < > = values in this interval not displayed. TSH:  Recent Labs  09/27/13 0745  TSH 0.699     Assessment/Plan 1. Wound of sacral region, subsequent encounter unstageable wounds followed by wound care.  2. Protein-calorie malnutrition, severe Unchanged, supplements provided   3. Abnormal chest x-ray Blastic changes were suggested on xray, discussed in detail with daughter regarding xray wound etcs, daughter is planning to talk to the rest of the family this weekend regarding goal of care and further workup for Ms Mcmahen. At this time we will not do any further workup  4. Chronic systolic congestive heart failure Stable at this time, no reports of worsening shortness of breath cough or congestion   5. Gastroesophageal reflux disease without esophagitis conts on prilosec  6. Anxiety Stable at this time    35 mins Time TOTAL:  time greater than 50% of total time spent doing pt, family and staff counseling and coordination of care regarding chronic conditions

## 2014-02-20 ENCOUNTER — Other Ambulatory Visit: Payer: Self-pay | Admitting: *Deleted

## 2014-02-20 MED ORDER — HYDROCODONE-ACETAMINOPHEN 5-325 MG PO TABS: ORAL_TABLET | ORAL | Status: AC

## 2014-02-20 NOTE — Telephone Encounter (Signed)
[facility] 

## 2014-02-21 ENCOUNTER — Encounter (HOSPITAL_BASED_OUTPATIENT_CLINIC_OR_DEPARTMENT_OTHER): Payer: PRIVATE HEALTH INSURANCE | Attending: General Surgery

## 2014-02-21 DIAGNOSIS — L89894 Pressure ulcer of other site, stage 4: Secondary | ICD-10-CM | POA: Diagnosis not present

## 2014-02-21 DIAGNOSIS — L89612 Pressure ulcer of right heel, stage 2: Secondary | ICD-10-CM | POA: Insufficient documentation

## 2014-02-21 DIAGNOSIS — L89622 Pressure ulcer of left heel, stage 2: Secondary | ICD-10-CM | POA: Diagnosis not present

## 2014-02-21 DIAGNOSIS — L89154 Pressure ulcer of sacral region, stage 4: Secondary | ICD-10-CM | POA: Diagnosis present

## 2014-02-22 NOTE — H&P (Signed)
NAMELENER, VENTRESCA NO.:  [id number]  MEDICAL RECORD NO.:  [id number]  LOCATION:  FOOT                         FACILITY:  [facility]  PHYSICIAN:  Elesa Hacker, M.D.        DATE OF BIRTH:  [birth year]  DATE OF ADMISSION:  02/21/2014 DATE OF DISCHARGE:                             HISTORY & PHYSICAL   CHIEF COMPLAINT:  Multiple decubitus wounds.  HISTORY OF PRESENT ILLNESS:  This is an 80 year old female who was been bed ridden for at least 5-6 years.  Her informant is LPN and also her granddaughter and sees her daily.  She has developed a sacral wound several months ago and has multiple other wounds.  She is completely bedridden and is completely disoriented.  PAST MEDICAL HISTORY:  Significant for pulmonary embolism in 2011, hyperlipidemia, rheumatoid arthritis, GERD, hypertension, diabetes, congestive heart failure, cardiomyopathy, anemia, dementia , and malnutrition. She may have had ovarian cancer in the past.  PAST SURGICAL HISTORY:  Total hip arthroplasty.  SOCIAL HISTORY:  Cigarettes none.  Alcohol none.  MEDICATIONS:  Alprazolam, aspirin, Coreg, Colace, ferrous sulfate, Vistaril,Nitrostat, Mycostatin, Prilosec, Phenergan, Zoloft, Zocor, Ultram, and Namenda.  REVIEW OF SYSTEMS:  As above.  PHYSICAL EXAMINATION:  GENERAL APPEARANCE: Well developed, slender.  She is lying on her stomach, is unresponsive. CHEST: Clear. HEART:  Regular rhythm. EXTREMITIES:  She has 4 major wounds.  Right heel is 3.6 x 4.2 covered with a thick eschar;  left midfoot is 2.7 x 4.7, this is also covered with a thin black layer;  left heel is 6.1 x 5.7, covered with a thick eschar; and the sacral wound is 11.0 x 9.5 x 1.7 somewhat foul with a sizable overhang at 12 o'clock and sacrum exposed.  IMPRESSION:  Multiple decubitus ulcers, several stage IV including of the left foot and sacrum.  We have excised the full-thickness eschars from both heels.  PLAN OF TREATMENT:  We will  continue Dakin solution wet-to-dry twice a day for sacrum. The other wounds, we will treat with Santyl and Hydrogel daily.  We will see her in 7 days.     Elesa Hacker, M.D.     RA/MEDQ  D:  02/21/2014  T:  02/22/2014  Job:  [id number]

## 2014-02-23 ENCOUNTER — Encounter (HOSPITAL_BASED_OUTPATIENT_CLINIC_OR_DEPARTMENT_OTHER): Payer: PRIVATE HEALTH INSURANCE

## 2014-02-28 DIAGNOSIS — L89154 Pressure ulcer of sacral region, stage 4: Secondary | ICD-10-CM | POA: Diagnosis not present

## 2014-02-28 DIAGNOSIS — L89612 Pressure ulcer of right heel, stage 2: Secondary | ICD-10-CM | POA: Diagnosis not present

## 2014-02-28 DIAGNOSIS — L89622 Pressure ulcer of left heel, stage 2: Secondary | ICD-10-CM | POA: Diagnosis not present

## 2014-02-28 DIAGNOSIS — L89894 Pressure ulcer of other site, stage 4: Secondary | ICD-10-CM | POA: Diagnosis not present

## 2014-02-28 LAB — GLUCOSE, CAPILLARY: Glucose-Capillary: 178 mg/dL — ABNORMAL HIGH (ref 70–99)

## 2014-03-07 ENCOUNTER — Ambulatory Visit: Payer: PRIVATE HEALTH INSURANCE | Admitting: Infectious Disease

## 2014-03-07 ENCOUNTER — Encounter (HOSPITAL_BASED_OUTPATIENT_CLINIC_OR_DEPARTMENT_OTHER): Payer: PRIVATE HEALTH INSURANCE | Attending: General Surgery

## 2014-03-07 DEATH — deceased

## 2014-03-08 ENCOUNTER — Ambulatory Visit: Payer: PRIVATE HEALTH INSURANCE | Admitting: Infectious Disease

## 2014-08-11 IMAGING — CR DG CHEST 1V PORT
1 series · 1 of 1 positions shown · non-contrast
Comparison: Chest radiograph performed 08/28/2011

CLINICAL DATA: Shortness of breath.

EXAM:
PORTABLE CHEST - 1 VIEW

[AP]
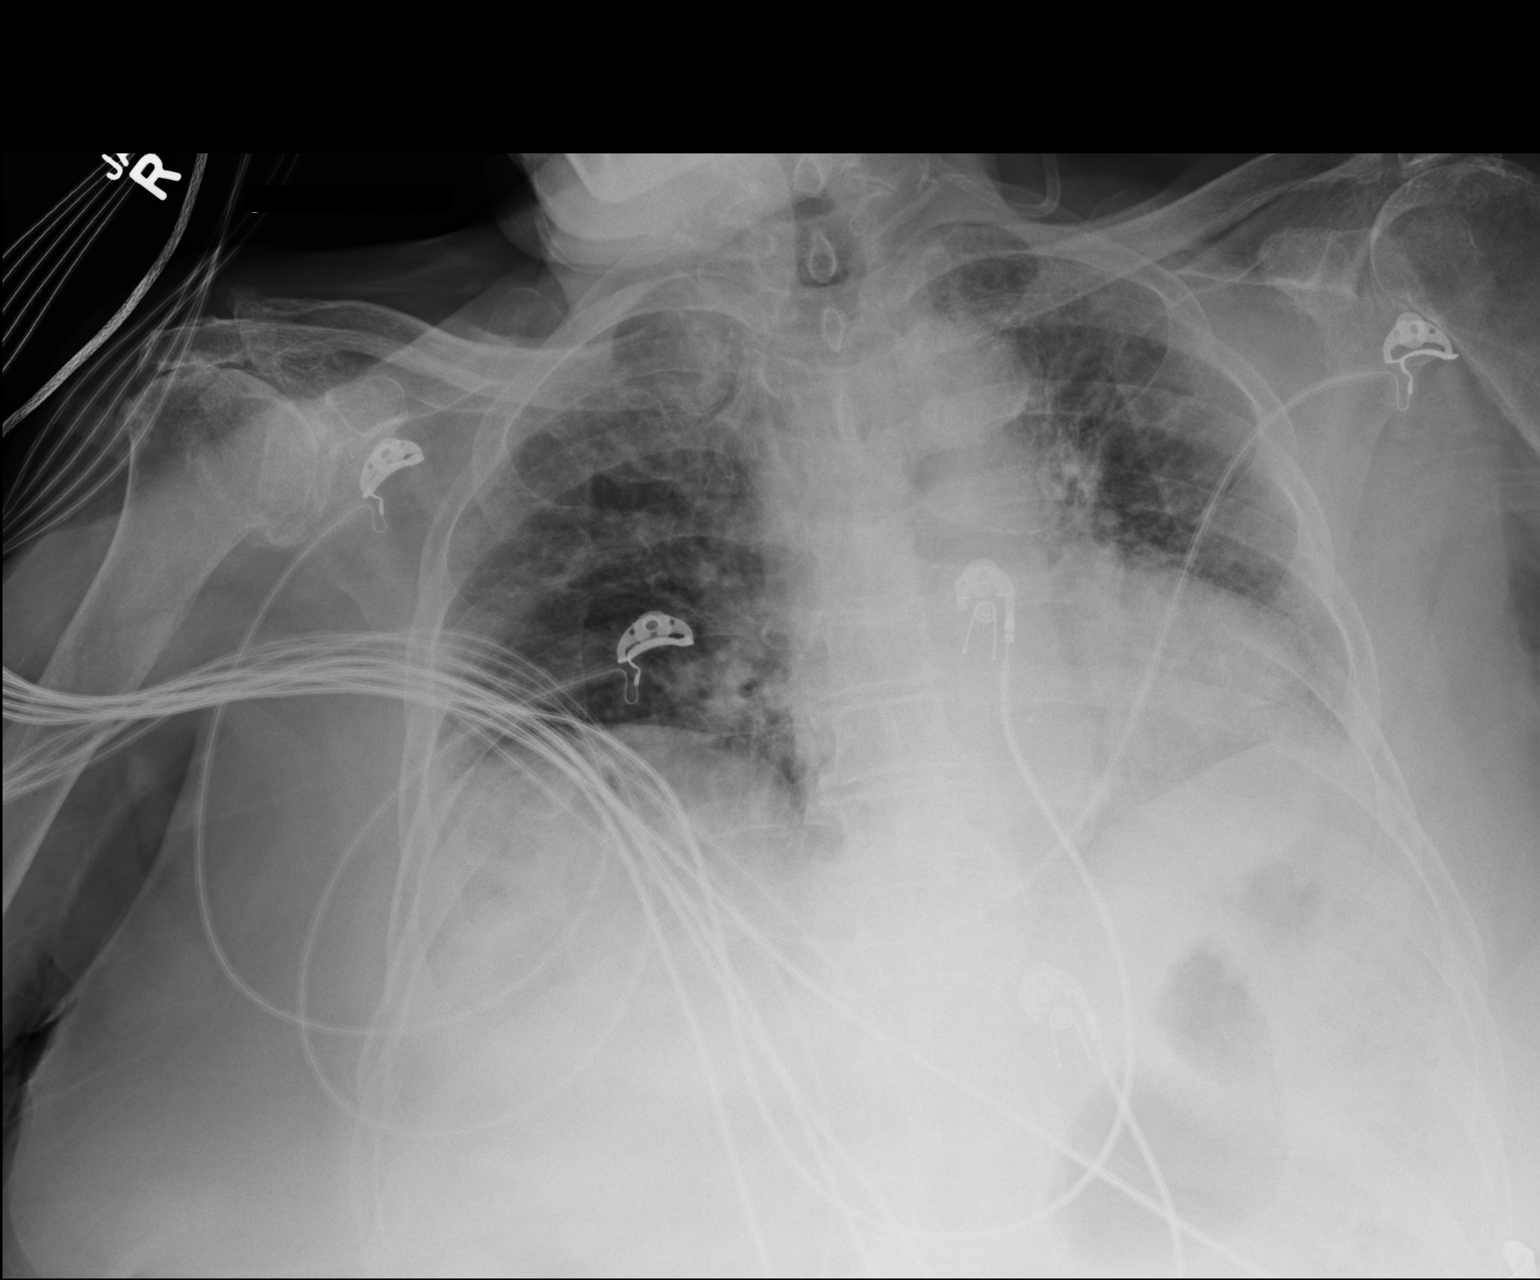

[1 of 1 positions shown; findings below may reference images not displayed]

FINDINGS: The lungs are hypoexpanded. Vascular congestion is noted. Mild
bilateral airspace opacities may reflect atelectasis or possibly
mild pneumonia. No definite pleural effusion or pneumothorax is
seen.

The cardiomediastinal silhouette is borderline normal in size. No
acute osseous abnormalities are identified. Degenerative change is
noted at the right glenohumeral joint, with chronic superior
subluxation of both humeral heads, reflecting underlying chronic
rotator cuff tears.
IMPRESSION: Lungs hypoexpanded. Vascular congestion seen. Mild bilateral
airspace opacities may reflect atelectasis or possibly mild
pneumonia, though difficult to fully assess given lung
hypoexpansion.

## 2014-08-13 IMAGING — CR DG CHEST 1V PORT
1 series · 1 of 1 positions shown · non-contrast
Comparison: 09/24/2013

CLINICAL DATA: Follow-up CHF

EXAM:
PORTABLE CHEST - 1 VIEW

[AP]
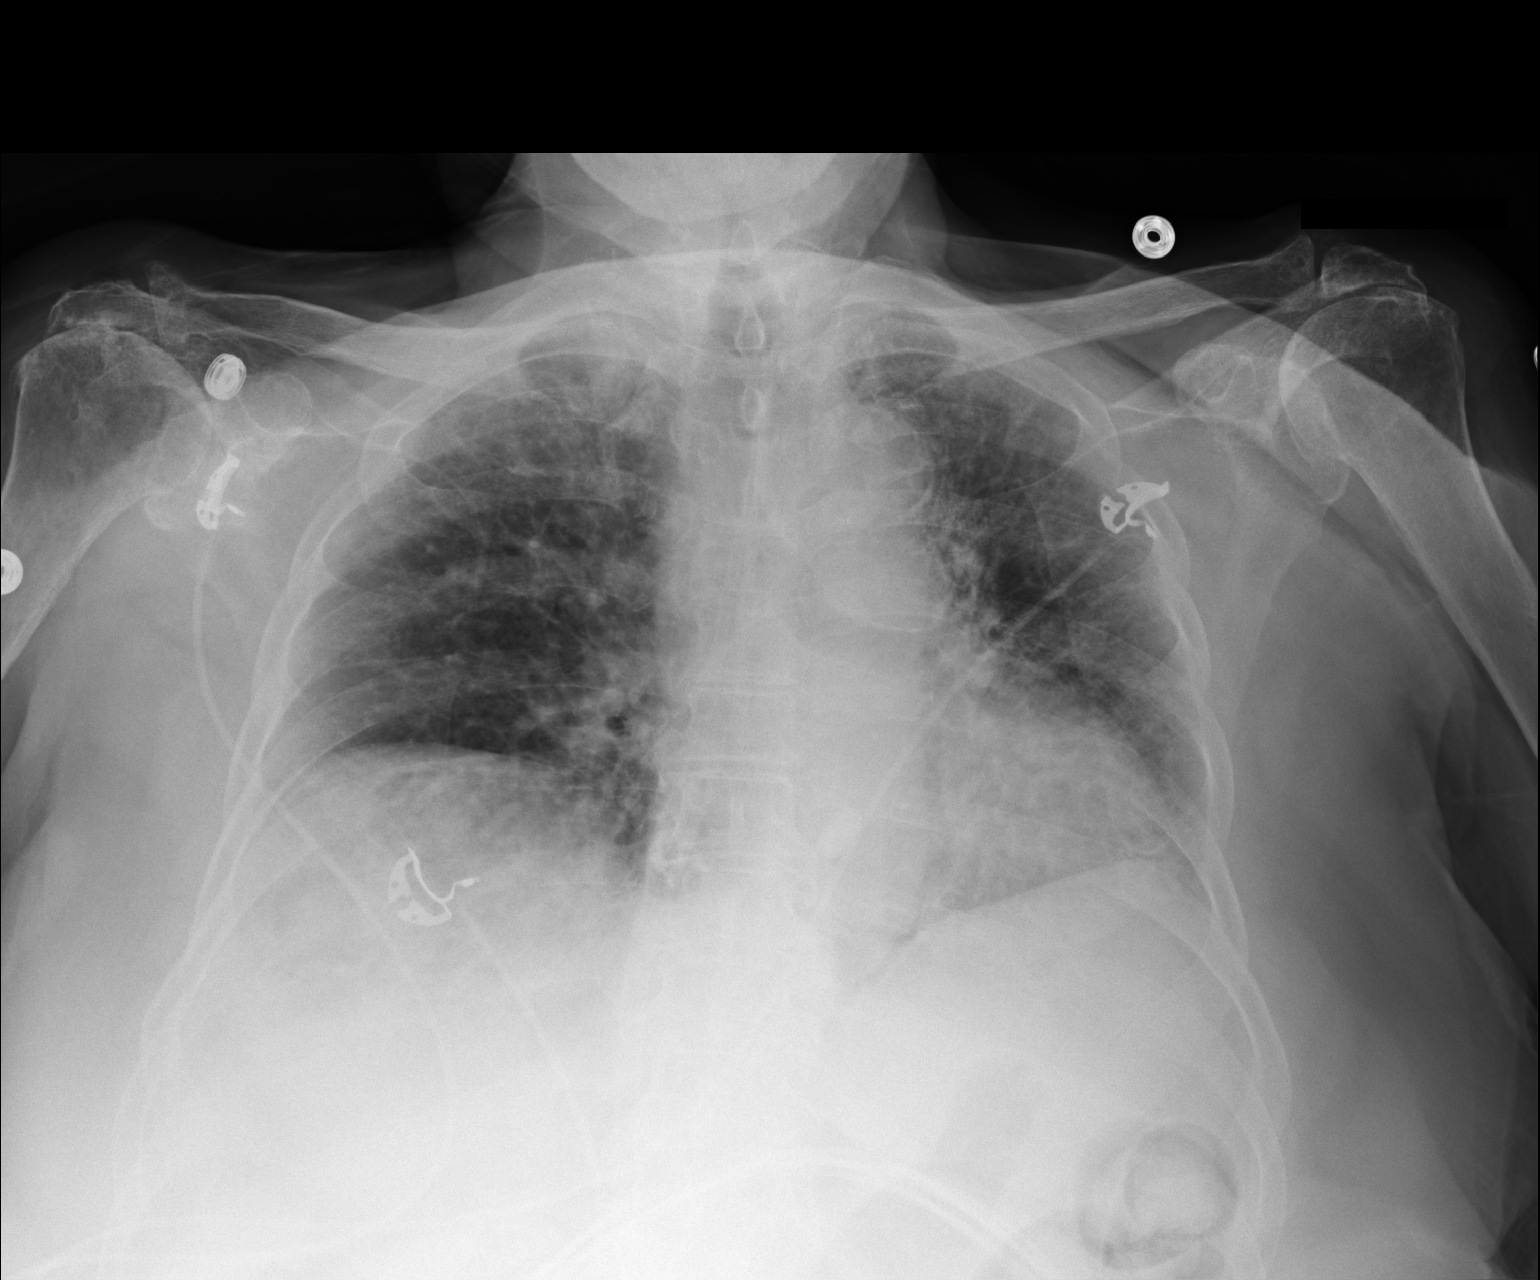

[1 of 1 positions shown; findings below may reference images not displayed]

FINDINGS: Cardiac shadow is stable. The overall inspiratory effort is poor but
stable. Degenerative changes are noted above shoulder joints
bilaterally. Diffuse interstitial changes as well as vascular
congestion are seen
IMPRESSION: Stable vascular congestion

## 2014-08-30 IMAGING — CR DG CHEST 1V PORT
1 series · 1 of 1 positions shown · non-contrast
Comparison: 09/26/2013

CLINICAL DATA: Shortness of breath.

EXAM:
PORTABLE CHEST - 1 VIEW

[AP]
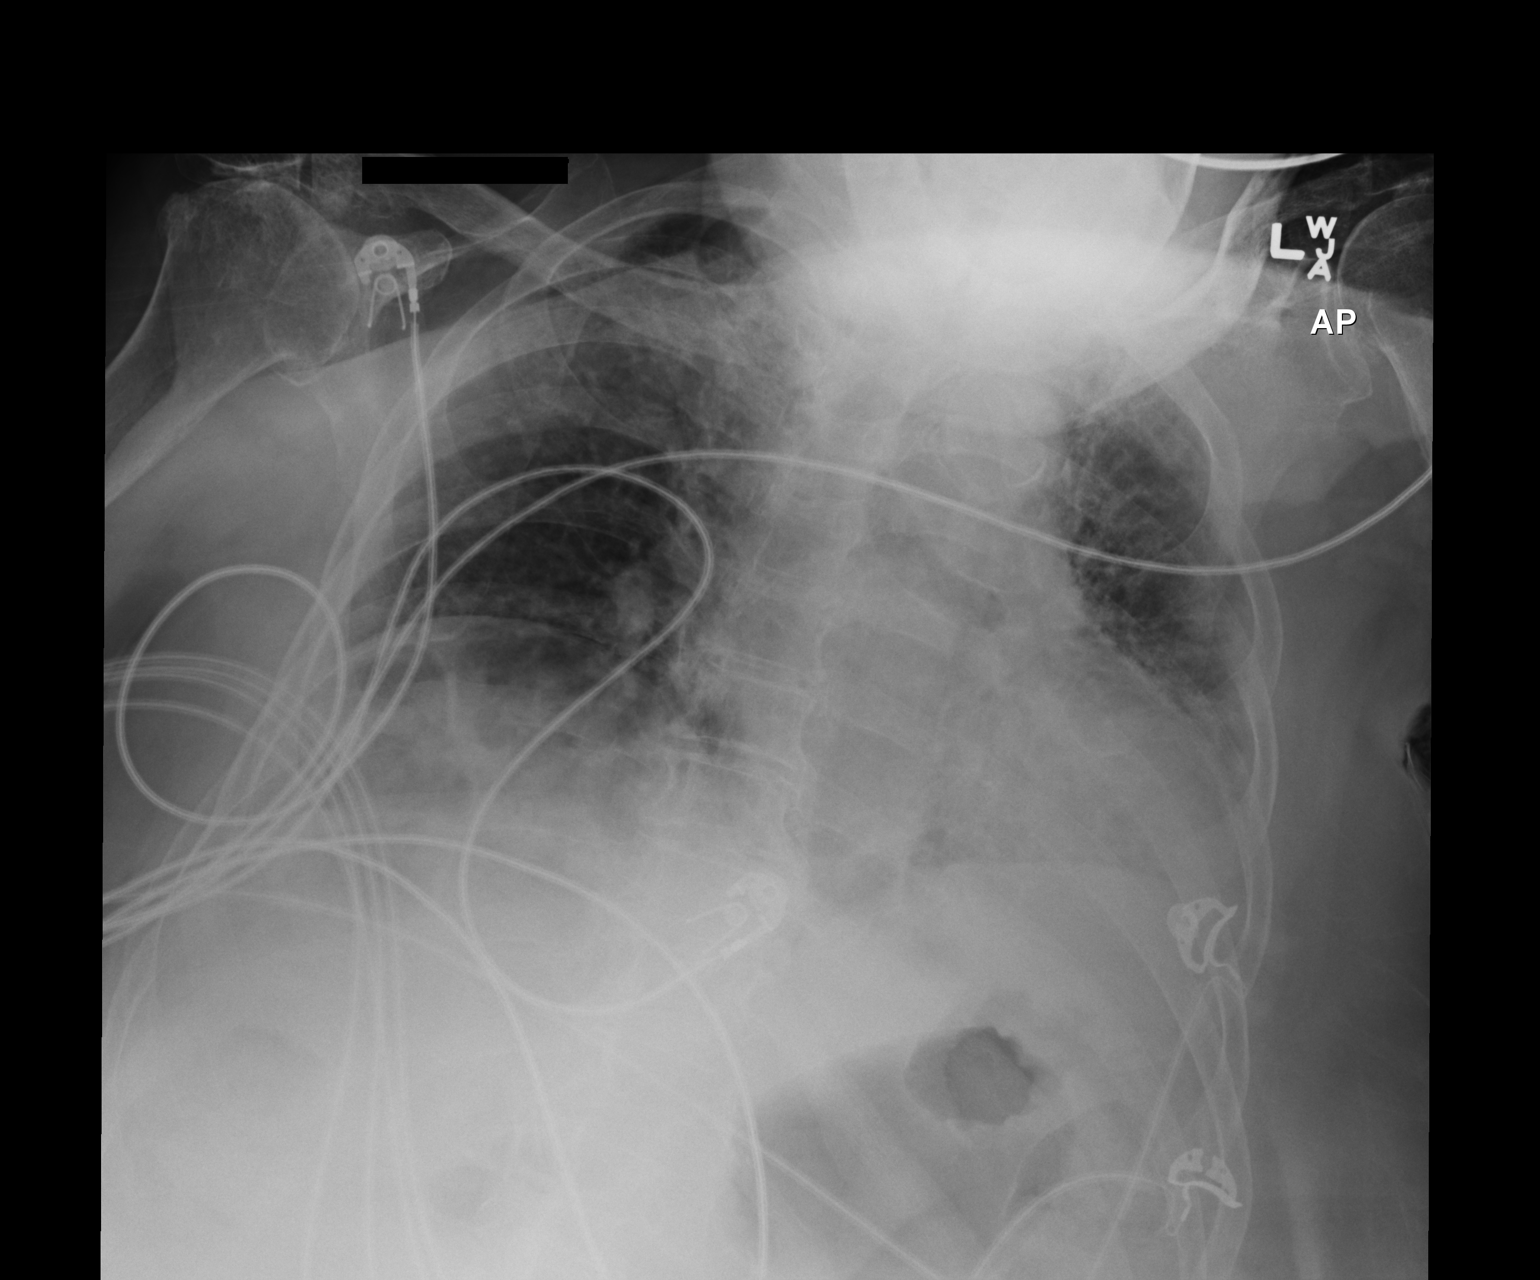

[1 of 1 positions shown; findings below may reference images not displayed]

FINDINGS: Shallow inspiration. Borderline heart size with mild vascular
congestion. No definite edema or airspace disease. Colonic
interposition under the right hemidiaphragm. Degenerative changes in
the shoulders. No change since prior study.
IMPRESSION: Shallow inspiration.  Mild vascular congestion without change.
